# Patient Record
Sex: Female | Born: 1975 | Hispanic: No | Smoking: Current every day smoker
Health system: Southern US, Community
[De-identification: ages and names within clinical notes are randomized; demographics above are authoritative.]

## PROBLEM LIST (undated history)

## (undated) DIAGNOSIS — F1021 Alcohol dependence, in remission: Secondary | ICD-10-CM

## (undated) DIAGNOSIS — F411 Generalized anxiety disorder: Secondary | ICD-10-CM

## (undated) DIAGNOSIS — F1910 Other psychoactive substance abuse, uncomplicated: Secondary | ICD-10-CM

## (undated) DIAGNOSIS — Z9151 Personal history of suicidal behavior: Secondary | ICD-10-CM

## (undated) DIAGNOSIS — F419 Anxiety disorder, unspecified: Secondary | ICD-10-CM

## (undated) DIAGNOSIS — J410 Simple chronic bronchitis: Secondary | ICD-10-CM

## (undated) DIAGNOSIS — F1120 Opioid dependence, uncomplicated: Secondary | ICD-10-CM

## (undated) DIAGNOSIS — F32A Depression, unspecified: Secondary | ICD-10-CM

## (undated) DIAGNOSIS — F329 Major depressive disorder, single episode, unspecified: Secondary | ICD-10-CM

## (undated) DIAGNOSIS — F319 Bipolar disorder, unspecified: Secondary | ICD-10-CM

## (undated) DIAGNOSIS — F1420 Cocaine dependence, uncomplicated: Secondary | ICD-10-CM

## (undated) DIAGNOSIS — E7800 Pure hypercholesterolemia, unspecified: Secondary | ICD-10-CM

## (undated) HISTORY — DX: Other psychoactive substance abuse, uncomplicated: F19.10

## (undated) HISTORY — DX: Major depressive disorder, single episode, unspecified: F32.9

## (undated) HISTORY — PX: TUBAL LIGATION: SHX77

## (undated) HISTORY — DX: Depression, unspecified: F32.A

## (undated) HISTORY — DX: Anxiety disorder, unspecified: F41.9

---

## 1998-04-02 ENCOUNTER — Other Ambulatory Visit: Admission: RE | Admit: 1998-04-02 | Discharge: 1998-04-02 | Payer: Self-pay | Admitting: Obstetrics and Gynecology

## 1998-05-01 ENCOUNTER — Inpatient Hospital Stay (HOSPITAL_COMMUNITY): Admission: AD | Admit: 1998-05-01 | Discharge: 1998-05-01 | Payer: Self-pay | Admitting: Obstetrics and Gynecology

## 1998-05-03 ENCOUNTER — Inpatient Hospital Stay (HOSPITAL_COMMUNITY): Admission: AD | Admit: 1998-05-03 | Discharge: 1998-05-03 | Payer: Self-pay | Admitting: Obstetrics and Gynecology

## 1998-05-06 ENCOUNTER — Inpatient Hospital Stay (HOSPITAL_COMMUNITY): Admission: AD | Admit: 1998-05-06 | Discharge: 1998-05-06 | Payer: Self-pay | Admitting: Unknown Physician Specialty

## 1999-06-17 ENCOUNTER — Other Ambulatory Visit: Admission: RE | Admit: 1999-06-17 | Discharge: 1999-06-17 | Payer: Self-pay | Admitting: Obstetrics and Gynecology

## 2000-01-09 ENCOUNTER — Other Ambulatory Visit: Admission: RE | Admit: 2000-01-09 | Discharge: 2000-01-09 | Payer: Self-pay | Admitting: Obstetrics and Gynecology

## 2000-01-30 ENCOUNTER — Inpatient Hospital Stay (HOSPITAL_COMMUNITY): Admission: AD | Admit: 2000-01-30 | Discharge: 2000-02-01 | Payer: Self-pay | Admitting: Obstetrics and Gynecology

## 2000-08-13 ENCOUNTER — Emergency Department (HOSPITAL_COMMUNITY): Admission: EM | Admit: 2000-08-13 | Discharge: 2000-08-13 | Payer: Self-pay | Admitting: Emergency Medicine

## 2001-03-10 ENCOUNTER — Other Ambulatory Visit: Admission: RE | Admit: 2001-03-10 | Discharge: 2001-03-10 | Payer: Self-pay | Admitting: *Deleted

## 2001-09-11 ENCOUNTER — Inpatient Hospital Stay (HOSPITAL_COMMUNITY): Admission: AD | Admit: 2001-09-11 | Discharge: 2001-09-13 | Payer: Self-pay | Admitting: Gynecology

## 2001-09-13 ENCOUNTER — Encounter: Payer: Self-pay | Admitting: Gynecology

## 2001-10-25 ENCOUNTER — Other Ambulatory Visit: Admission: RE | Admit: 2001-10-25 | Discharge: 2001-10-25 | Payer: Self-pay | Admitting: *Deleted

## 2003-03-22 ENCOUNTER — Inpatient Hospital Stay (HOSPITAL_COMMUNITY): Admission: AD | Admit: 2003-03-22 | Discharge: 2003-03-22 | Payer: Self-pay | Admitting: *Deleted

## 2004-09-13 ENCOUNTER — Emergency Department (HOSPITAL_COMMUNITY): Admission: EM | Admit: 2004-09-13 | Discharge: 2004-09-13 | Payer: Self-pay | Admitting: Emergency Medicine

## 2005-01-13 ENCOUNTER — Ambulatory Visit: Payer: Self-pay | Admitting: Family Medicine

## 2005-01-24 ENCOUNTER — Ambulatory Visit: Payer: Self-pay | Admitting: Internal Medicine

## 2005-01-27 ENCOUNTER — Ambulatory Visit (HOSPITAL_COMMUNITY): Admission: RE | Admit: 2005-01-27 | Discharge: 2005-01-27 | Payer: Self-pay | Admitting: Internal Medicine

## 2005-02-27 ENCOUNTER — Emergency Department (HOSPITAL_COMMUNITY): Admission: EM | Admit: 2005-02-27 | Discharge: 2005-02-27 | Payer: Self-pay | Admitting: Emergency Medicine

## 2005-06-22 ENCOUNTER — Ambulatory Visit: Payer: Self-pay | Admitting: Psychiatry

## 2005-06-22 ENCOUNTER — Inpatient Hospital Stay (HOSPITAL_COMMUNITY): Admission: AD | Admit: 2005-06-22 | Discharge: 2005-06-26 | Payer: Self-pay | Admitting: Psychiatry

## 2005-06-22 ENCOUNTER — Encounter: Payer: Self-pay | Admitting: *Deleted

## 2006-04-29 ENCOUNTER — Ambulatory Visit: Payer: Self-pay | Admitting: Internal Medicine

## 2006-05-18 ENCOUNTER — Ambulatory Visit (HOSPITAL_COMMUNITY): Admission: RE | Admit: 2006-05-18 | Discharge: 2006-05-18 | Payer: Self-pay | Admitting: Family Medicine

## 2006-06-25 ENCOUNTER — Ambulatory Visit (HOSPITAL_COMMUNITY): Admission: RE | Admit: 2006-06-25 | Discharge: 2006-06-25 | Payer: Self-pay | Admitting: Family Medicine

## 2006-10-07 ENCOUNTER — Ambulatory Visit (HOSPITAL_COMMUNITY): Admission: RE | Admit: 2006-10-07 | Discharge: 2006-10-07 | Payer: Self-pay | Admitting: Family Medicine

## 2006-10-23 ENCOUNTER — Inpatient Hospital Stay (HOSPITAL_COMMUNITY): Admission: AD | Admit: 2006-10-23 | Discharge: 2006-10-23 | Payer: Self-pay | Admitting: Obstetrics & Gynecology

## 2006-10-23 ENCOUNTER — Ambulatory Visit: Payer: Self-pay | Admitting: *Deleted

## 2006-10-30 ENCOUNTER — Ambulatory Visit (HOSPITAL_COMMUNITY): Admission: RE | Admit: 2006-10-30 | Discharge: 2006-10-30 | Payer: Self-pay | Admitting: Family Medicine

## 2006-11-05 ENCOUNTER — Ambulatory Visit: Payer: Self-pay | Admitting: Family Medicine

## 2006-11-10 ENCOUNTER — Inpatient Hospital Stay (HOSPITAL_COMMUNITY): Admission: AD | Admit: 2006-11-10 | Discharge: 2006-11-10 | Payer: Self-pay | Admitting: Obstetrics and Gynecology

## 2006-11-10 ENCOUNTER — Ambulatory Visit: Payer: Self-pay | Admitting: Obstetrics and Gynecology

## 2006-11-12 ENCOUNTER — Ambulatory Visit: Payer: Self-pay | Admitting: Gynecology

## 2006-11-12 ENCOUNTER — Inpatient Hospital Stay (HOSPITAL_COMMUNITY): Admission: AD | Admit: 2006-11-12 | Discharge: 2006-11-14 | Payer: Self-pay | Admitting: Obstetrics and Gynecology

## 2006-11-12 ENCOUNTER — Ambulatory Visit: Payer: Self-pay | Admitting: Family Medicine

## 2007-06-12 ENCOUNTER — Emergency Department (HOSPITAL_COMMUNITY): Admission: EM | Admit: 2007-06-12 | Discharge: 2007-06-12 | Payer: Self-pay | Admitting: Emergency Medicine

## 2008-02-25 ENCOUNTER — Emergency Department (HOSPITAL_COMMUNITY): Admission: EM | Admit: 2008-02-25 | Discharge: 2008-02-25 | Payer: Self-pay | Admitting: Emergency Medicine

## 2009-02-27 ENCOUNTER — Emergency Department (HOSPITAL_COMMUNITY): Admission: EM | Admit: 2009-02-27 | Discharge: 2009-02-27 | Payer: Self-pay | Admitting: Emergency Medicine

## 2010-01-27 ENCOUNTER — Emergency Department (HOSPITAL_COMMUNITY): Admission: EM | Admit: 2010-01-27 | Discharge: 2010-01-27 | Payer: Self-pay | Admitting: Family Medicine

## 2010-11-21 ENCOUNTER — Emergency Department (HOSPITAL_COMMUNITY)
Admission: EM | Admit: 2010-11-21 | Discharge: 2010-11-21 | Payer: Self-pay | Source: Home / Self Care | Admitting: Emergency Medicine

## 2010-11-23 ENCOUNTER — Emergency Department (HOSPITAL_COMMUNITY)
Admission: EM | Admit: 2010-11-23 | Discharge: 2010-11-23 | Payer: Self-pay | Source: Home / Self Care | Admitting: Emergency Medicine

## 2010-12-22 ENCOUNTER — Encounter: Payer: Self-pay | Admitting: *Deleted

## 2011-02-20 LAB — POCT URINALYSIS DIP (DEVICE)
Glucose, UA: NEGATIVE mg/dL
Nitrite: NEGATIVE
Protein, ur: 30 mg/dL — AB
Specific Gravity, Urine: 1.025 (ref 1.005–1.030)
Urobilinogen, UA: 0.2 mg/dL (ref 0.0–1.0)
pH: 5.5 (ref 5.0–8.0)

## 2011-02-20 LAB — URINE CULTURE: Colony Count: 75000

## 2011-02-20 LAB — GC/CHLAMYDIA PROBE AMP, GENITAL
Chlamydia, DNA Probe: NEGATIVE
GC Probe Amp, Genital: NEGATIVE

## 2011-02-20 LAB — POCT PREGNANCY, URINE: Preg Test, Ur: NEGATIVE

## 2011-02-20 LAB — WET PREP, GENITAL
Clue Cells Wet Prep HPF POC: NONE SEEN
Yeast Wet Prep HPF POC: NONE SEEN

## 2011-03-07 ENCOUNTER — Emergency Department (HOSPITAL_COMMUNITY)
Admission: EM | Admit: 2011-03-07 | Discharge: 2011-03-07 | Disposition: A | Discharge status: Home / Self Care | Payer: Medicaid Other | Attending: Emergency Medicine | Admitting: Emergency Medicine

## 2011-03-07 DIAGNOSIS — F329 Major depressive disorder, single episode, unspecified: Secondary | ICD-10-CM | POA: Insufficient documentation

## 2011-03-07 DIAGNOSIS — F1410 Cocaine abuse, uncomplicated: Secondary | ICD-10-CM | POA: Insufficient documentation

## 2011-03-07 DIAGNOSIS — S61509A Unspecified open wound of unspecified wrist, initial encounter: Secondary | ICD-10-CM | POA: Insufficient documentation

## 2011-03-07 DIAGNOSIS — F411 Generalized anxiety disorder: Secondary | ICD-10-CM | POA: Insufficient documentation

## 2011-03-07 DIAGNOSIS — F3289 Other specified depressive episodes: Secondary | ICD-10-CM | POA: Insufficient documentation

## 2011-03-07 DIAGNOSIS — F1010 Alcohol abuse, uncomplicated: Secondary | ICD-10-CM | POA: Insufficient documentation

## 2011-03-07 DIAGNOSIS — X789XXA Intentional self-harm by unspecified sharp object, initial encounter: Secondary | ICD-10-CM | POA: Insufficient documentation

## 2011-03-07 LAB — DIFFERENTIAL
Basophils Absolute: 0 10*3/uL (ref 0.0–0.1)
Basophils Relative: 0 % (ref 0–1)
Eosinophils Absolute: 0.1 10*3/uL (ref 0.0–0.7)
Eosinophils Relative: 1 % (ref 0–5)
Lymphocytes Relative: 18 % (ref 12–46)
Lymphs Abs: 2.4 10*3/uL (ref 0.7–4.0)
Monocytes Absolute: 0.5 10*3/uL (ref 0.1–1.0)
Monocytes Relative: 4 % (ref 3–12)
Neutro Abs: 10.3 10*3/uL — ABNORMAL HIGH (ref 1.7–7.7)
Neutrophils Relative %: 77 % (ref 43–77)

## 2011-03-07 LAB — COMPREHENSIVE METABOLIC PANEL
ALT: 15 U/L (ref 0–35)
AST: 16 U/L (ref 0–37)
Albumin: 3.8 g/dL (ref 3.5–5.2)
Alkaline Phosphatase: 73 U/L (ref 39–117)
BUN: 9 mg/dL (ref 6–23)
CO2: 25 mEq/L (ref 19–32)
Calcium: 9.1 mg/dL (ref 8.4–10.5)
Chloride: 105 mEq/L (ref 96–112)
Creatinine, Ser: 0.95 mg/dL (ref 0.4–1.2)
GFR calc Af Amer: >60 mL/min (ref >60)
GFR calc non Af Amer: >60 mL/min (ref >60)
Glucose, Bld: 95 mg/dL (ref 70–99)
Potassium: 3.9 mEq/L (ref 3.5–5.1)
Sodium: 136 mEq/L (ref 135–145)
Total Bilirubin: 0.7 mg/dL (ref 0.3–1.2)
Total Protein: 6.9 g/dL (ref 6.0–8.3)

## 2011-03-07 LAB — CBC
HCT: 42.3 % (ref 36.0–46.0)
Hemoglobin: 13.9 g/dL (ref 12.0–15.0)
MCH: 31.5 pg (ref 26.0–34.0)
MCHC: 32.9 g/dL (ref 30.0–36.0)
MCV: 95.9 fL (ref 78.0–100.0)
Platelets: 373 10*3/uL (ref 150–400)
RBC: 4.41 MIL/uL (ref 3.87–5.11)
RDW: 13.2 % (ref 11.5–15.5)
WBC: 13.3 10*3/uL — ABNORMAL HIGH (ref 4.0–10.5)

## 2011-03-07 LAB — URINALYSIS, ROUTINE W REFLEX MICROSCOPIC
Bilirubin Urine: NEGATIVE
Glucose, UA: NEGATIVE mg/dL
Hgb urine dipstick: NEGATIVE
Ketones, ur: NEGATIVE mg/dL
Nitrite: NEGATIVE
Protein, ur: NEGATIVE mg/dL
Specific Gravity, Urine: 1.021 (ref 1.005–1.030)
Urobilinogen, UA: 0.2 mg/dL (ref 0.0–1.0)
pH: 7 (ref 5.0–8.0)

## 2011-03-07 LAB — RAPID URINE DRUG SCREEN, HOSP PERFORMED
Amphetamines: NOT DETECTED
Barbiturates: NOT DETECTED
Benzodiazepines: POSITIVE — AB
Cocaine: POSITIVE — AB
Opiates: NOT DETECTED
Tetrahydrocannabinol: POSITIVE — AB

## 2011-03-07 LAB — ETHANOL: Alcohol, Ethyl (B): <5 mg/dL (ref 0–10)

## 2011-03-13 ENCOUNTER — Emergency Department (HOSPITAL_COMMUNITY)
Admission: EM | Admit: 2011-03-13 | Discharge: 2011-03-14 | Disposition: A | Discharge status: Home / Self Care | Payer: Medicaid Other | Attending: Emergency Medicine | Admitting: Emergency Medicine

## 2011-03-13 DIAGNOSIS — F1910 Other psychoactive substance abuse, uncomplicated: Secondary | ICD-10-CM | POA: Insufficient documentation

## 2011-03-13 LAB — COMPREHENSIVE METABOLIC PANEL
ALT: 13 U/L (ref 0–35)
AST: 16 U/L (ref 0–37)
Albumin: 3.7 g/dL (ref 3.5–5.2)
Alkaline Phosphatase: 72 U/L (ref 39–117)
BUN: 14 mg/dL (ref 6–23)
CO2: 26 mEq/L (ref 19–32)
Calcium: 9.1 mg/dL (ref 8.4–10.5)
Chloride: 106 mEq/L (ref 96–112)
Creatinine, Ser: 1.04 mg/dL (ref 0.4–1.2)
GFR calc Af Amer: >60 mL/min (ref >60)
GFR calc non Af Amer: 60 mL/min — ABNORMAL LOW (ref >60)
Glucose, Bld: 82 mg/dL (ref 70–99)
Potassium: 3.9 mEq/L (ref 3.5–5.1)
Sodium: 138 mEq/L (ref 135–145)
Total Bilirubin: 0.7 mg/dL (ref 0.3–1.2)
Total Protein: 6.8 g/dL (ref 6.0–8.3)

## 2011-03-13 LAB — DIFFERENTIAL
Basophils Absolute: 0.1 10*3/uL (ref 0.0–0.1)
Basophils Relative: 1 % (ref 0–1)
Eosinophils Absolute: 0.1 10*3/uL (ref 0.0–0.7)
Eosinophils Relative: 1 % (ref 0–5)
Lymphocytes Relative: 32 % (ref 12–46)
Lymphs Abs: 3.5 10*3/uL (ref 0.7–4.0)
Monocytes Absolute: 0.9 10*3/uL (ref 0.1–1.0)
Monocytes Relative: 9 % (ref 3–12)
Neutro Abs: 6.3 10*3/uL (ref 1.7–7.7)
Neutrophils Relative %: 57 % (ref 43–77)

## 2011-03-13 LAB — POCT PREGNANCY, URINE: Preg Test, Ur: NEGATIVE

## 2011-03-13 LAB — CBC
HCT: 40.5 % (ref 36.0–46.0)
Hemoglobin: 13.7 g/dL (ref 12.0–15.0)
MCH: 31.4 pg (ref 26.0–34.0)
MCHC: 33.8 g/dL (ref 30.0–36.0)
MCV: 92.7 fL (ref 78.0–100.0)
Platelets: 379 10*3/uL (ref 150–400)
RBC: 4.37 MIL/uL (ref 3.87–5.11)
RDW: 12.9 % (ref 11.5–15.5)
WBC: 10.9 10*3/uL — ABNORMAL HIGH (ref 4.0–10.5)

## 2011-03-13 LAB — RAPID URINE DRUG SCREEN, HOSP PERFORMED
Amphetamines: NOT DETECTED
Barbiturates: NOT DETECTED
Benzodiazepines: POSITIVE — AB
Cocaine: POSITIVE — AB
Opiates: NOT DETECTED
Tetrahydrocannabinol: POSITIVE — AB

## 2011-03-13 LAB — ETHANOL: Alcohol, Ethyl (B): <5 mg/dL (ref 0–10)

## 2011-04-18 NOTE — Discharge Summary (Signed)
[facility]  Patient:    Michele Webster, Michele Webster Visit Number: [id number] MRN: [id number]          Service Type: OBS Location: 910A [id number] [id number] Attending Physician:  Tonye Royalty Dictated by:   Antony Contras, [facility] Admit Date:  09/11/2001 Discharge Date: 09/13/2001                             Discharge Summary  DISCHARGE DIAGNOSES:          1. Intrauterine pregnancy at 37-1/[redacted] weeks                                  gestation.                               2. Spontaneous onset of labor.  PROCEDURES:                   Normal spontaneous vaginal delivery of                               viable infant over intact perineum.  HISTORY OF PRESENT ILLNESS:   The patient is a 35 year old, gravida 5, para 1-0-3-1, with an _______ of September 25, 2001 by ultrasound. Pregnancy was complicated by cigarettes smoking.  PRENATAL LABORATORY DATA:     Blood type O positive, antibody screen negative. RPR, HBsAg, HIV nonreactive. GBS is negative.  HOSPITAL COURSE/TREATMENT:    Patient was admitted for uterine contractions on September 12, 2001. Cervix was 4-5 cm, 90% effaced, and -2 station. She progressed to complete dilatation and delivered an Apgar 8/9 female weighing 7 pounds 5 ounces over an intact perineum.  POSTPARTUM COURSE:            The patient did experience some respiratory difficulty including inspiratory and expiratory wheezes. Patient was treated with albuterol and also Zithromax 500 mg p.o. q.d. Chest x-ray also revealed no active disease. Otherwise she remained afebrile and had no difficulty voiding and was able to be discharged on her second postpartum day in satisfactory condition.  CBC:  Hematocrit 29.3, hemoglobin 10.3, WBCs 11.9, platelets 298,000.  DISPOSITION:                  The patient is to follow up in six weeks.  DISCHARGE MEDICATIONS:        The patient is to continue with prenatal vitamins and iron and Motrin and Tylox for  pain. Dictated by:   Antony Contras, [facility] Attending Physician:  Tonye Royalty DD:  09/24/01 TD:  09/27/01 Job: [id number]

## 2011-04-18 NOTE — Op Note (Signed)
Michele Webster, Michele Webster            ACCOUNT NO.:  [id number]   MEDICAL RECORD NO.:  [id number]          PATIENT TYPE:  INP   LOCATION:                                FACILITY:  [facility]   PHYSICIAN:  Tanya S. Shawnie Pons, M.D.   DATE OF BIRTH:  [birth year]   DATE OF PROCEDURE:  11/13/2006  DATE OF DISCHARGE:  11/14/2006                               OPERATIVE REPORT   PREOPERATIVE DIAGNOSIS:  Undesired fertility and multiparity.   POSTOPERATIVE DIAGNOSIS:  Undesired fertility and multiparity.   PROCEDURE:  Postpartum bilateral tubal ligation with Filshie clips.   SURGEON:  Shelbie Proctor. Shawnie Pons, M.D.   ASSISTANT:  Paticia Stack, MD   ANESTHESIA:  Epidural and local.   SPECIMEN:  None.   ESTIMATED BLOOD LOSS:  Minimal.   COMPLICATIONS:  None.   REASON FOR PROCEDURE:  The patient is a 35 year old gravida 7, para 2-1-  4-3 who desires permanent sterilization.  Was counseled in regards to  risks and benefits of this procedure including permanency of the  procedure, risk of failure 1 in 200, increased risk of ectopic.  The  patient agreed to this and desired to proceed.   PROCEDURE:  The patient was taken to the OR.  She was placed in the  supine position.  She was prepped and draped in the usual sterile  fashion.  When anesthesia was felt to be adequate, she was injected with  5 mL of 0.25% Marcaine with epinephrine.  A knife was used to make a 1  cm incision infraumbilically.  This was carried down through underlying  fascia until the peritoneum was entered bluntly with hemostat.  The  patient's left tube was identified, grasped with a Babcock clamp,  brought to the incision, followed to its fimbriated end.  Approximately  2 cm from the cornua, a Filshie clip was placed across this under direct  visualization.  The tube was returned to the abdomen.  The patient's  right tube was similarly identified, grasped with a Babcock clamp,  brought to the incision, followed to its fimbriated end.  Two  cm from  the cornua, a Filshie clip was placed across this tube as well under  direct visualization.  The tube was returned to the abdomen.  The fascia  was closed with 0 Vicryl suture in a running  fashion.  Subcuticular suture was a 4-0 Vicryl running also.  The  patient tolerated the procedure well.  All instrument, needle and lap  counts were correct x2.  The patient was awakened and taken to the  recovery room in stable condition.           ______________________________  Shelbie Proctor Shawnie Pons, M.D.     TSP/MEDQ  D:  11/13/2006  T:  11/13/2006  Job:  [id number]

## 2011-04-18 NOTE — Discharge Summary (Signed)
Michele Webster, Michele Webster            ACCOUNT NO.:  [id number]   MEDICAL RECORD NO.:  [id number]          PATIENT TYPE:  IPS   LOCATION:  0300                          FACILITY:  [facility]   PHYSICIAN:  Jeanice Lim, M.D. DATE OF BIRTH:  05/16/1976   DATE OF ADMISSION:  06/22/2005  DATE OF DISCHARGE:  06/26/2005                                 DISCHARGE SUMMARY   IDENTIFYING DATA:  This is a 35 year old Caucasian female voluntarily  admitted after overdosing on 20-30 aspirin as per report, between 5 and 10  a.m. on June 22, 2005.  Went to the ER and reported that she wanted to die.  Husband had left with children in the morning.  Relapsed with crack cocaine  and this was triggered by a rage episode.  Had used cocaine five times in  two years.  First psychiatric admission.  First treatment.  Onset of mood  swings and irritability after the birth of child.   MEDICATIONS:  Potassium 40 mEq.   ALLERGIES:  CECLOR.   PHYSICAL EXAMINATION:  Physical and neurologic exam within normal limits.   LABORATORY DATA:  Routine admission labs within normal limits.   MENTAL STATUS EXAM:  Fully alert, cooperative.  Irritable affect.  Normal  production.  Irritable.  Thought processes goal directed, coherent.  Insight  adequate.  Cognitively intact.  Judgment and insight were impaired.  Possible suicidal ideation with plan to overdose or cut wrist.   ADMISSION DIAGNOSES:  AXIS I:  Rule out bipolar disorder not otherwise  specified.  Cocaine abuse.  Polysubstance abuse.  Cocaine dependence  possibility.  AXIS II:  Deferred.  AXIS III:  Headache not otherwise specified.  AXIS IV:  Moderate (problems with support system, sleep cycle).  AXIS V:  30/54.   HOSPITAL COURSE:  The patient monitored for safety.  Reported a positive  response to crisis intervention and stabilization on medications.  No side  effects.  The patient was given medication education.   CONDITION ON DISCHARGE:  Discharged in  improved condition with no risk  issues including suicidal, homicidal or psychotic symptoms, reporting  motivation to remain abstinent with relapse prevention with a total  aftercare plan.  Discharged after medication education.   DISCHARGE MEDICATIONS:  1.  Depakote 500 mg, 2-1/2 q.h.s.  2.  Seroquel 200 mg, 1 q.h.s.  3.  Risperdal 1 mg q.h.s.  4.  Ambien 10 mg, 1 q.h.s.   The patient's valproic acid level was 55.2 on June 25, 2005.   FOLLOW UP:  To follow up with [facility] [place] and Dr. __________  at [facility].   DISCHARGE DIAGNOSES:  AXIS I:  Rule out bipolar disorder not otherwise  specified.  Cocaine abuse.  Polysubstance abuse.  Cocaine dependence  possibility.  AXIS II:  Deferred.  AXIS III:  Headache not otherwise specified.  AXIS IV:  Moderate (problems with support system, sleep cycle).  AXIS V:  GAF on discharge 55.      Jeanice Lim, M.D.  Electronically Signed     JEM/MEDQ  D:  08/05/2005  T:  08/06/2005  Job:  [id number] 

## 2011-04-18 NOTE — H&P (Signed)
[facility]  Patient:    Michele Webster, Michele Webster Visit Number: [id number] MRN: [id number]          Service Type: Attending:  Gaetano Hawthorne. Lily Peer, M.D. Dictated by:   Gaetano Hawthorne Lily Peer, M.D. Adm. Date:  09/11/01                           History and Physical  CHIEF COMPLAINT:              Contractions.  HISTORY OF PRESENT ILLNESS:   The patient is a 35 year old, gravida 5, para 1, AB 3, with estimated confinement based on ultrasound of ______ 26, 2002.  The patient is currently 37-1/2 weeks estimated gestational age.  The patient presented to the emergency room at [facility] this evening complaining of contractions which started earlier today.  Her contractions were reported initially to be 45 minutes apart.  Late this evening, they increased to about every 5 to 6 minutes apart.  She denies any rupture of membranes, no fever, chills, nausea or vomiting.  Review of her medical record indicated that she had been counselled on numerous occasions secondary to smoking.  According to patient by history, she was in the emergency room last week and was treated for some form of infection, and her contractions were stopped and did not ______ .  She also had history of rapid labor of about an hour and one-half with her last pregnancy.  Otherwise, the remainder of her prenatal course is insignificant with the exception of iron-deficiency anemia for which she was placed on supplemental iron.   Total weight gain during her pregnancy was approximately 15 pounds.  PAST MEDICAL HISTORY:         She smokes one pack of cigarettes per day.   She denies any other medical problems.  ALLERGIES:                    CECLOR.  PAST OBSTETRICAL HISTORY:     She has had spontaneous AB in 1993 and 1999. She had a therapeutic AB in 1995.  REVIEW OF SYSTEMS:            See form.  PHYSICAL EXAMINATION:  VITAL SIGNS:                  Blood pressure 119/79, pulse 92,  respirations 20, temperature 97.3.  HEENT:                        Unremarkable.  NECK:                         Supple.  Jugular venous long.  No carotid bruits or thyromegaly.  LUNGS:                        Clear to auscultation without rhonchi or wheezes.  HEART:                        Regular rate and rhythm.  No murmurs or gallops.  BREASTS:                      Exam not done.  ABDOMEN:                      Gravid uterus.  Vertex presentation by Thayer Ohm  maneuver.  Positive fetal heart tones.  PELVIC:                       Cervix 4 to 5 cm, 90% effaced, -2 station, bulging membranes.  EXTREMITIES:                  DTRs 1+, negative clonus, trace edema.  PRENATAL LABORATORY DATA:     O positive blood type, negative antibody screen. VDRL was nonreactive, rubella immune, hepatitis B surface antigen and HIV negative.  Pap smear reactive changes.  Total serum alpha-fetoprotein was normal.  GBS culture was negative.  Diabetes screen was normal.  ASSESSMENT:                   A 35 year old, gravida 5, para 1, abortions 3, currently 37-1/2 weeks estimated gestational age in active labor with reassuring fetal heart rate tracing in the emergency room, contractions 4 to 5 minutes apart with advanced cervical dilatation of 4 to 5 cm, bulging membranes, 90%, -2 station, and negative group B Streptococcus culture.  Once the patient arrives to labor and delivery, she has requested epidural.  Once the epidural is on board, will proceed with artificial rupture of membranes and Pitocin augmentation as indicated in the event of protracted labor.  PLAN:                         Per Assessment above.  Anticipate a vaginal delivery. Dictated by:   Gaetano Hawthorne Lily Peer, M.D. Attending:  Gaetano Hawthorne. Lily Peer, M.D. DD:  09/12/01 TD:  09/12/01 Job: [id number]

## 2011-07-18 ENCOUNTER — Other Ambulatory Visit: Payer: Self-pay

## 2011-07-18 ENCOUNTER — Other Ambulatory Visit (HOSPITAL_COMMUNITY)
Admission: RE | Admit: 2011-07-18 | Discharge: 2011-07-18 | Disposition: A | Discharge status: Home / Self Care | Payer: Medicaid Other | Source: Ambulatory Visit | Attending: Family Medicine | Admitting: Family Medicine

## 2011-07-18 DIAGNOSIS — Z01419 Encounter for gynecological examination (general) (routine) without abnormal findings: Secondary | ICD-10-CM | POA: Insufficient documentation

## 2012-01-21 ENCOUNTER — Ambulatory Visit (INDEPENDENT_AMBULATORY_CARE_PROVIDER_SITE_OTHER): Payer: Medicaid Other | Admitting: Surgery

## 2012-01-29 ENCOUNTER — Encounter (INDEPENDENT_AMBULATORY_CARE_PROVIDER_SITE_OTHER): Payer: Self-pay | Admitting: Surgery

## 2012-02-04 ENCOUNTER — Encounter (INDEPENDENT_AMBULATORY_CARE_PROVIDER_SITE_OTHER): Payer: Self-pay | Admitting: Surgery

## 2012-02-05 ENCOUNTER — Encounter (INDEPENDENT_AMBULATORY_CARE_PROVIDER_SITE_OTHER): Payer: Self-pay | Admitting: Surgery

## 2012-02-05 ENCOUNTER — Ambulatory Visit (INDEPENDENT_AMBULATORY_CARE_PROVIDER_SITE_OTHER): Payer: Medicaid Other | Admitting: Surgery

## 2012-02-05 VITALS — BP 110/74 | HR 68 | Temp 98.6°F | Ht 65.0 in | Wt 157.8 lb

## 2012-02-05 DIAGNOSIS — K602 Anal fissure, unspecified: Secondary | ICD-10-CM | POA: Insufficient documentation

## 2012-02-05 NOTE — Progress Notes (Signed)
Patient ID: Michele Webster, female   DOB: January 18, 1976, 36 y.o.   MRN: [id number]  Chief Complaint  Patient presents with  . Pre-op Exam    hems    HPI Michele Webster is a 36 y.o. female.  Referred by Nile Dear at [facility] for evaluation of painful hemorrhoids HPI 36 yo female presents with several months of painful bowel movements associated with some bright red blood.  She denies any constipation.  She initially had problems with hemorrhoids with pregnancy, but the last pregnancy was 10 years ago.  She is a recovering cocaine addict, and she feels that there has been some change in her bowel habits over the last year, but she continues to have daily bowel movements.  Suppositories and Miralax have been minimally helpful.  Past Medical History  Diagnosis Date  . Depression   . Anxiety   . Hypertension   . Hyperlipidemia   . Hx of endoscopy 06/08/2007  . Substance abuse     Past Surgical History  Procedure Date  . Appendectomy   . Btl   . Myringotomy   . Tubal ligation     Family History  Problem Relation Age of Onset  . Cancer Maternal Grandmother     breast    Social History History  Substance Use Topics  . Smoking status: Current Everyday Smoker  . Smokeless tobacco: Not on file  . Alcohol Use: No    Allergies  Allergen Reactions  . Ceclor (Cefaclor)     Current Outpatient Prescriptions  Medication Sig Dispense Refill  . cetirizine (ZYRTEC) 10 MG tablet Take 10 mg by mouth daily.      . hydrocortisone (ANUSOL-HC) 2.5 % rectal cream Place rectally 4 (four) times daily.      Marland Kitchen topiramate (TOPAMAX) 25 MG tablet Take 25 mg by mouth 3 (three) times daily.      . traZODone (DESYREL) 50 MG tablet Take 50 mg by mouth at bedtime.        Review of Systems Review of Systems  Constitutional: Negative for fever, chills and unexpected weight change.  HENT: Negative for hearing loss, congestion, sore throat, trouble swallowing and voice change.   Eyes: Negative  for visual disturbance.  Respiratory: Negative for cough and wheezing.   Cardiovascular: Negative for chest pain, palpitations and leg swelling.  Gastrointestinal: Positive for blood in stool, anal bleeding and rectal pain. Negative for nausea, vomiting, abdominal pain, diarrhea, constipation and abdominal distention.  Genitourinary: Negative for hematuria, vaginal bleeding and difficulty urinating.  Musculoskeletal: Negative for arthralgias.  Skin: Negative for rash and wound.  Neurological: Positive for headaches. Negative for seizures and syncope.  Hematological: Negative for adenopathy. Does not bruise/bleed easily.  Psychiatric/Behavioral: Negative for confusion.    Blood pressure 110/74, pulse 68, temperature 98.6 F (37 C), temperature source Temporal, height 5\' 5"  (1.651 m), weight 157 lb 12.8 oz (71.578 kg), SpO2 98.00%.  Physical Exam Physical Exam WDWN in NAD Rectal - minimal external hemorrhoidal skin; no inflammation or swelling; no thrombosis Anterior and posterior midline fissures which are quite tender; Data Reviewed none  Assessment    Anterior/ posterior midline fissures    Plan    Stool softeners Sitz baths before and after bowel movements Diltiazem ointment  Follow-up 1 month.        Michele Webster K. 02/05/2012, 12:13 PM

## 2012-02-05 NOTE — Patient Instructions (Signed)
Frequent sitz baths - before and after bowel movements Diltiazem ointment 2-3 times a day Stool softeners as needed to avoid hard bowel movements. Recheck in 4 weeks.

## 2012-02-06 ENCOUNTER — Encounter (INDEPENDENT_AMBULATORY_CARE_PROVIDER_SITE_OTHER): Payer: Self-pay

## 2012-02-26 ENCOUNTER — Ambulatory Visit (INDEPENDENT_AMBULATORY_CARE_PROVIDER_SITE_OTHER): Payer: Medicaid Other | Admitting: Surgery

## 2012-02-26 ENCOUNTER — Encounter (INDEPENDENT_AMBULATORY_CARE_PROVIDER_SITE_OTHER): Payer: Self-pay | Admitting: Surgery

## 2012-02-26 VITALS — BP 98/56 | HR 74 | Temp 98.0°F | Ht 65.0 in | Wt 160.2 lb

## 2012-02-26 DIAGNOSIS — K602 Anal fissure, unspecified: Secondary | ICD-10-CM

## 2012-02-26 NOTE — Progress Notes (Signed)
Patient ID: Michele Webster, female   DOB: [birth year], 36 y.o.   MRN: [id number]  Followup of her anal fissures. She is much improved. Minimal discomfort. She only sees blood on toilet paper occasionally. She has been keeping her bowel movements daily and regular without any straining.  On examination she has some loose external hemorrhoidal skin but no swelling. The posterior midline fissure seems to be completely healed. Anterior midline fissure is almost healed but is still mildly tender to palpation. No sign of abscess or fistula.  The patient should continue her current regimen until the discomfort is completely gone. Followup p.r.n  Wilmon Arms. [name], MD, [facility]  02/26/2012 11:13 AM

## 2012-04-18 ENCOUNTER — Encounter (HOSPITAL_COMMUNITY): Payer: Self-pay | Admitting: *Deleted

## 2012-04-18 ENCOUNTER — Emergency Department (HOSPITAL_COMMUNITY)
Admission: EM | Admit: 2012-04-18 | Discharge: 2012-04-18 | Disposition: A | Discharge status: Home / Self Care | Payer: Medicaid Other | Attending: Emergency Medicine | Admitting: Emergency Medicine

## 2012-04-18 ENCOUNTER — Emergency Department (HOSPITAL_COMMUNITY): Payer: Medicaid Other

## 2012-04-18 DIAGNOSIS — R079 Chest pain, unspecified: Secondary | ICD-10-CM | POA: Insufficient documentation

## 2012-04-18 DIAGNOSIS — F17200 Nicotine dependence, unspecified, uncomplicated: Secondary | ICD-10-CM | POA: Insufficient documentation

## 2012-04-18 LAB — CBC
HCT: 38.5 % (ref 36.0–46.0)
Hemoglobin: 13 g/dL (ref 12.0–15.0)
MCH: 31.3 pg (ref 26.0–34.0)
MCHC: 33.8 g/dL (ref 30.0–36.0)
MCV: 92.8 fL (ref 78.0–100.0)
Platelets: 362 10*3/uL (ref 150–400)
RBC: 4.15 MIL/uL (ref 3.87–5.11)
RDW: 12.9 % (ref 11.5–15.5)
WBC: 12 10*3/uL — ABNORMAL HIGH (ref 4.0–10.5)

## 2012-04-18 LAB — TROPONIN I: Troponin I: <0.3 ng/mL (ref <0.30)

## 2012-04-18 LAB — BASIC METABOLIC PANEL
BUN: 12 mg/dL (ref 6–23)
CO2: 25 mEq/L (ref 19–32)
Calcium: 8.1 mg/dL — ABNORMAL LOW (ref 8.4–10.5)
Chloride: 107 mEq/L (ref 96–112)
Creatinine, Ser: 0.98 mg/dL (ref 0.50–1.10)
GFR calc Af Amer: 84 mL/min — ABNORMAL LOW (ref >90)
GFR calc non Af Amer: 73 mL/min — ABNORMAL LOW (ref >90)
Glucose, Bld: 96 mg/dL (ref 70–99)
Potassium: 4 mEq/L (ref 3.5–5.1)
Sodium: 139 mEq/L (ref 135–145)

## 2012-04-18 MED ORDER — DIAZEPAM 5 MG PO TABS: 5.0000 mg | ORAL_TABLET | Freq: Two times a day (BID) | ORAL | Status: AC

## 2012-04-18 MED ORDER — DICLOFENAC SODIUM 75 MG PO TBEC: 75.0000 mg | DELAYED_RELEASE_TABLET | Freq: Two times a day (BID) | ORAL | Status: DC

## 2012-04-18 MED ORDER — KETOROLAC TROMETHAMINE 60 MG/2ML IM SOLN
60.0000 mg | Freq: Once | INTRAMUSCULAR | Status: AC
  Administered 2012-04-18: 60 mg via INTRAMUSCULAR
  Filled 2012-04-18: qty 2

## 2012-04-18 NOTE — ED Provider Notes (Signed)
History     CSN: [id number]  Arrival date & time 04/18/12  1359   First MD Initiated Contact with Patient 04/18/12 1514      Chief Complaint  Patient presents with  . Chest Pain    (Consider location/radiation/quality/duration/timing/severity/associated sxs/prior treatment) HPI History from patient. 36 year old female who presents with chest pain which started suddenly around 3 AM today, and has been persistent since that time. States this started after she was "horsing around" with her 160 pound son earlier in the day. Pain is described as "feeling like someone is punching me in the chest." It is located substernally and does not radiate. Pain is worse with any movement or bending forward.  She admits some slight shortness of breath secondary to pain. Denies diaphoresis, nausea, vomiting. Denies any recent URI symptoms. She does smoke cigarettes and admits a "smoker's cough" but this has not increased significantly recently. Denies fever or chills. No known trauma or change in activity. She's never had anything like this before. No treatment at home prior to arrival.  Patient has no prior history of DVT/PE. Denies recent trauma, surgery, or prolonged immobilization. Denies hemoptysis, leg swelling, or use of exogenous estrogen.   Past Medical History  Diagnosis Date  . Depression   . Anxiety   . Substance abuse     Past Surgical History  Procedure Date  . Appendectomy   . Tubal ligation     Family History  Problem Relation Age of Onset  . Cancer Maternal Grandmother     breast    History  Substance Use Topics  . Smoking status: Current Everyday Smoker  . Smokeless tobacco: Never Used  . Alcohol Use: No    OB History    Grav Para Term Preterm Abortions TAB SAB Ect Mult Living                  Review of Systems  Constitutional: Negative for fever, chills, activity change and appetite change.  HENT: Negative.   Eyes: Negative.   Respiratory: Negative for cough,  chest tightness and shortness of breath.   Cardiovascular: Positive for chest pain. Negative for palpitations.  Gastrointestinal: Negative for nausea, vomiting and abdominal pain.  Musculoskeletal: Negative for myalgias.  Skin: Negative for color change and rash.  Neurological: Negative for dizziness and weakness.    Allergies  Ceclor  Home Medications   Current Outpatient Rx  Name Route Sig Dispense Refill  . CETIRIZINE HCL 10 MG PO TABS Oral Take 10 mg by mouth daily.    . SERTRALINE HCL 100 MG PO TABS Oral Take 100 mg by mouth daily.    . TOPIRAMATE 25 MG PO TABS Oral Take 75 mg by mouth 3 (three) times daily.     . TRAZODONE HCL 50 MG PO TABS Oral Take 50 mg by mouth at bedtime.      BP 98/62  Pulse 71  Temp(Src) 98.3 F (36.8 C) (Oral)  Resp 20  SpO2 100%  LMP 04/18/2012  Physical Exam  Nursing note and vitals reviewed. Constitutional: She appears well-developed and well-nourished. No distress.  HENT:  Head: Normocephalic and atraumatic.  Mouth/Throat: Oropharynx is clear and moist.  Eyes: EOM are normal.  Neck: Normal range of motion. Neck supple.  Cardiovascular: Normal rate, regular rhythm, normal heart sounds and intact distal pulses.  Exam reveals no gallop and no friction rub.   No murmur heard. Pulmonary/Chest: Effort normal and breath sounds normal. She exhibits tenderness.  Chest reproducibly tender to palpation over sternum  Abdominal: Soft. There is no tenderness. There is no rebound.  Musculoskeletal: Normal range of motion.  Neurological: She is alert.  Skin: Skin is warm and dry. She is not diaphoretic.  Psychiatric: She has a normal mood and affect.    ED Course  Procedures (including critical care time)   Date: 04/18/2012   Rate: 63  Rhythm: normal sinus rhythm  QRS Axis: normal  Intervals: normal  ST/T Wave abnormalities: normal  Conduction Disutrbances:none R-R variability  Narrative Interpretation:   Old EKG Reviewed: compared  with Jul 2006, rate increased - R-R variability seen on old as well  Labs Reviewed  CBC - Abnormal; Notable for the following:    WBC 12.0 (*)    All other components within normal limits  BASIC METABOLIC PANEL - Abnormal; Notable for the following:    Calcium 8.1 (*)    GFR calc non Af Amer 73 (*)    GFR calc Af Amer 84 (*)    All other components within normal limits  TROPONIN I   Dg Chest 2 View  04/18/2012  *RADIOLOGY REPORT*  Clinical Data: Chest pain.  CHEST - 2 VIEW  Comparison: 06/22/2005  Findings: Heart size and vascularity are normal and the lungs are clear.  No osseous abnormality.  IMPRESSION: Normal chest.  Original Report Authenticated By: Gwynn Burly, M.D.     1. Chest pain       MDM  Pt presents with chest pain, which is reproducible to palpation on exam and occurred after lifting her son. Vitals stable. ECG unchanged from previous. Labs and CXR unremarkable. Do not feel this represents ACS; no concern for PE. Pt txed with toradol and had relief with this. Will tx as MSK/costochondritis. Reasons to return to ED discussed. Pt agreeable with plan.        Grant Fontana, [place] 04/18/12 2141

## 2012-04-18 NOTE — Discharge Instructions (Signed)
Your lab studies, EKG, and chest xray were all normal appearing today. Your chest pain is likely due to the muscles/cartilage in your chest. You've been given prescriptions for an anti-inflammatory and a muscle relaxer. Take these as prescribed. Return to the ED with worsening pain, high fever, or any other worrisome symptoms.  Chest Pain (Nonspecific) It is often hard to give a specific diagnosis for the cause of chest pain. There is always a chance that your pain could be related to something serious, such as a heart attack or a blood clot in the lungs. You need to follow up with your caregiver for further evaluation. CAUSES   Heartburn.   Pneumonia or bronchitis.   Anxiety or stress.   Inflammation around your heart (pericarditis) or lung (pleuritis or pleurisy).   A blood clot in the lung.   A collapsed lung (pneumothorax). It can develop suddenly on its own (spontaneous pneumothorax) or from injury (trauma) to the chest.   Shingles infection (herpes zoster virus).  The chest wall is composed of bones, muscles, and cartilage. Any of these can be the source of the pain.  The bones can be bruised by injury.   The muscles or cartilage can be strained by coughing or overwork.   The cartilage can be affected by inflammation and become sore (costochondritis).  DIAGNOSIS  Lab tests or other studies, such as X-rays, electrocardiography, stress testing, or cardiac imaging, may be needed to find the cause of your pain.  TREATMENT   Treatment depends on what may be causing your chest pain. Treatment may include:   Acid blockers for heartburn.   Anti-inflammatory medicine.   Pain medicine for inflammatory conditions.   Antibiotics if an infection is present.   You may be advised to change lifestyle habits. This includes stopping smoking and avoiding alcohol, caffeine, and chocolate.   You may be advised to keep your head raised (elevated) when sleeping. This reduces the chance of  acid going backward from your stomach into your esophagus.   Most of the time, nonspecific chest pain will improve within 2 to 3 days with rest and mild pain medicine.  HOME CARE INSTRUCTIONS   If antibiotics were prescribed, take your antibiotics as directed. Finish them even if you start to feel better.   For the next few days, avoid physical activities that bring on chest pain. Continue physical activities as directed.   Do not smoke.   Avoid drinking alcohol.   Only take over-the-counter or prescription medicine for pain, discomfort, or fever as directed by your caregiver.   Follow your caregiver's suggestions for further testing if your chest pain does not go away.   Keep any follow-up appointments you made. If you do not go to an appointment, you could develop lasting (chronic) problems with pain. If there is any problem keeping an appointment, you must call to reschedule.  SEEK MEDICAL CARE IF:   You think you are having problems from the medicine you are taking. Read your medicine instructions carefully.   Your chest pain does not go away, even after treatment.   You develop a rash with blisters on your chest.  SEEK IMMEDIATE MEDICAL CARE IF:   You have increased chest pain or pain that spreads to your arm, neck, jaw, back, or abdomen.   You develop shortness of breath, an increasing cough, or you are coughing up blood.   You have severe back or abdominal pain, feel nauseous, or vomit.   You develop severe weakness,  fainting, or chills.   You have a fever.  THIS IS AN EMERGENCY. Do not wait to see if the pain will go away. Get medical help at once. Call your local emergency services (911 in [place] Do not drive yourself to the hospital. MAKE SURE YOU:   Understand these instructions.   Will watch your condition.   Will get help right away if you are not doing well or get worse.  Document Released: 08/27/2005 Document Revised: 11/06/2011 Document Reviewed:  06/22/2008 [facility] Patient Information 2012 [place], [place].Chest Pain (Nonspecific) It is often hard to give a specific diagnosis for the cause of chest pain. There is always a chance that your pain could be related to something serious, such as a heart attack or a blood clot in the lungs. You need to follow up with your caregiver for further evaluation. CAUSES   Heartburn.   Pneumonia or bronchitis.   Anxiety or stress.   Inflammation around your heart (pericarditis) or lung (pleuritis or pleurisy).   A blood clot in the lung.   A collapsed lung (pneumothorax). It can develop suddenly on its own (spontaneous pneumothorax) or from injury (trauma) to the chest.   Shingles infection (herpes zoster virus).  The chest wall is composed of bones, muscles, and cartilage. Any of these can be the source of the pain.  The bones can be bruised by injury.   The muscles or cartilage can be strained by coughing or overwork.   The cartilage can be affected by inflammation and become sore (costochondritis).  DIAGNOSIS  Lab tests or other studies, such as X-rays, electrocardiography, stress testing, or cardiac imaging, may be needed to find the cause of your pain.  TREATMENT   Treatment depends on what may be causing your chest pain. Treatment may include:   Acid blockers for heartburn.   Anti-inflammatory medicine.   Pain medicine for inflammatory conditions.   Antibiotics if an infection is present.   You may be advised to change lifestyle habits. This includes stopping smoking and avoiding alcohol, caffeine, and chocolate.   You may be advised to keep your head raised (elevated) when sleeping. This reduces the chance of acid going backward from your stomach into your esophagus.   Most of the time, nonspecific chest pain will improve within 2 to 3 days with rest and mild pain medicine.  HOME CARE INSTRUCTIONS   If antibiotics were prescribed, take your antibiotics as directed. Finish  them even if you start to feel better.   For the next few days, avoid physical activities that bring on chest pain. Continue physical activities as directed.   Do not smoke.   Avoid drinking alcohol.   Only take over-the-counter or prescription medicine for pain, discomfort, or fever as directed by your caregiver.   Follow your caregiver's suggestions for further testing if your chest pain does not go away.   Keep any follow-up appointments you made. If you do not go to an appointment, you could develop lasting (chronic) problems with pain. If there is any problem keeping an appointment, you must call to reschedule.  SEEK MEDICAL CARE IF:   You think you are having problems from the medicine you are taking. Read your medicine instructions carefully.   Your chest pain does not go away, even after treatment.   You develop a rash with blisters on your chest.  SEEK IMMEDIATE MEDICAL CARE IF:   You have increased chest pain or pain that spreads to your arm, neck, jaw, back,  or abdomen.   You develop shortness of breath, an increasing cough, or you are coughing up blood.   You have severe back or abdominal pain, feel nauseous, or vomit.   You develop severe weakness, fainting, or chills.   You have a fever.  THIS IS AN EMERGENCY. Do not wait to see if the pain will go away. Get medical help at once. Call your local emergency services (911 in [place] Do not drive yourself to the hospital. MAKE SURE YOU:   Understand these instructions.   Will watch your condition.   Will get help right away if you are not doing well or get worse.  Document Released: 08/27/2005 Document Revised: 11/06/2011 Document Reviewed: 06/22/2008 [facility] Patient Information 2012 [place], [place].

## 2012-04-18 NOTE — ED Notes (Signed)
Pt states around 11 am today she developed midsternal chest pain,non-radiating. Pt states chest pain is worse when taking a deep breath in or turning body, bending down. Pt states has not been coughing enough to have chest discomfort. Pt states she did lift her 38 year old son who is 89 this morning and the pain did start after that. Pt states she has not felt this type of pain before.

## 2012-04-19 NOTE — ED Provider Notes (Signed)
Medical screening examination/treatment/procedure(s) were performed by non-physician practitioner and as supervising physician I was immediately available for consultation/collaboration.   Suzi Roots, MD 04/19/12 [phone]

## 2013-01-08 ENCOUNTER — Emergency Department (HOSPITAL_COMMUNITY)
Admission: EM | Admit: 2013-01-08 | Discharge: 2013-01-09 | Disposition: A | Discharge status: Home / Self Care | Payer: Medicaid Other | Attending: Emergency Medicine | Admitting: Emergency Medicine

## 2013-01-08 ENCOUNTER — Encounter (HOSPITAL_COMMUNITY): Payer: Self-pay | Admitting: Emergency Medicine

## 2013-01-08 DIAGNOSIS — F17200 Nicotine dependence, unspecified, uncomplicated: Secondary | ICD-10-CM | POA: Insufficient documentation

## 2013-01-08 DIAGNOSIS — F1910 Other psychoactive substance abuse, uncomplicated: Secondary | ICD-10-CM | POA: Insufficient documentation

## 2013-01-08 DIAGNOSIS — Z8659 Personal history of other mental and behavioral disorders: Secondary | ICD-10-CM | POA: Insufficient documentation

## 2013-01-08 DIAGNOSIS — R45851 Suicidal ideations: Secondary | ICD-10-CM | POA: Insufficient documentation

## 2013-01-08 DIAGNOSIS — Z3202 Encounter for pregnancy test, result negative: Secondary | ICD-10-CM | POA: Insufficient documentation

## 2013-01-08 DIAGNOSIS — F1210 Cannabis abuse, uncomplicated: Secondary | ICD-10-CM | POA: Insufficient documentation

## 2013-01-08 DIAGNOSIS — R630 Anorexia: Secondary | ICD-10-CM | POA: Insufficient documentation

## 2013-01-08 DIAGNOSIS — F1410 Cocaine abuse, uncomplicated: Secondary | ICD-10-CM | POA: Insufficient documentation

## 2013-01-08 LAB — CBC
HCT: 41.4 % (ref 36.0–46.0)
Hemoglobin: 14.5 g/dL (ref 12.0–15.0)
MCH: 31.5 pg (ref 26.0–34.0)
MCHC: 35 g/dL (ref 30.0–36.0)
MCV: 90 fL (ref 78.0–100.0)
Platelets: 423 10*3/uL — ABNORMAL HIGH (ref 150–400)
RBC: 4.6 MIL/uL (ref 3.87–5.11)
RDW: 12.4 % (ref 11.5–15.5)
WBC: 10.9 10*3/uL — ABNORMAL HIGH (ref 4.0–10.5)

## 2013-01-08 LAB — COMPREHENSIVE METABOLIC PANEL
ALT: 9 U/L (ref 0–35)
AST: 17 U/L (ref 0–37)
Albumin: 4 g/dL (ref 3.5–5.2)
Alkaline Phosphatase: 68 U/L (ref 39–117)
BUN: 8 mg/dL (ref 6–23)
CO2: 21 mEq/L (ref 19–32)
Calcium: 9.1 mg/dL (ref 8.4–10.5)
Chloride: 102 mEq/L (ref 96–112)
Creatinine, Ser: 0.79 mg/dL (ref 0.50–1.10)
GFR calc Af Amer: >90 mL/min (ref >90)
GFR calc non Af Amer: >90 mL/min (ref >90)
Glucose, Bld: 59 mg/dL — ABNORMAL LOW (ref 70–99)
Potassium: 3.2 mEq/L — ABNORMAL LOW (ref 3.5–5.1)
Sodium: 134 mEq/L — ABNORMAL LOW (ref 135–145)
Total Bilirubin: 0.7 mg/dL (ref 0.3–1.2)
Total Protein: 6.9 g/dL (ref 6.0–8.3)

## 2013-01-08 LAB — ETHANOL: Alcohol, Ethyl (B): <11 mg/dL (ref 0–11)

## 2013-01-08 LAB — POCT PREGNANCY, URINE: Preg Test, Ur: NEGATIVE

## 2013-01-08 LAB — RAPID URINE DRUG SCREEN, HOSP PERFORMED
Amphetamines: NOT DETECTED
Barbiturates: NOT DETECTED
Benzodiazepines: POSITIVE — AB
Cocaine: POSITIVE — AB
Opiates: NOT DETECTED
Tetrahydrocannabinol: POSITIVE — AB

## 2013-01-08 LAB — GLUCOSE, CAPILLARY: Glucose-Capillary: 110 mg/dL — ABNORMAL HIGH (ref 70–99)

## 2013-01-08 LAB — SALICYLATE LEVEL: Salicylate Lvl: <2 mg/dL — ABNORMAL LOW (ref 2.8–20.0)

## 2013-01-08 LAB — ACETAMINOPHEN LEVEL: Acetaminophen (Tylenol), Serum: <15 ug/mL (ref 10–30)

## 2013-01-08 MED ORDER — ACETAMINOPHEN 325 MG PO TABS: 650.0000 mg | ORAL_TABLET | ORAL | Status: DC | PRN

## 2013-01-08 MED ORDER — IBUPROFEN 600 MG PO TABS: 600.0000 mg | ORAL_TABLET | Freq: Three times a day (TID) | ORAL | Status: DC | PRN

## 2013-01-08 MED ORDER — ONDANSETRON HCL 4 MG PO TABS: 4.0000 mg | ORAL_TABLET | Freq: Three times a day (TID) | ORAL | Status: DC | PRN

## 2013-01-08 MED ORDER — ALUM & MAG HYDROXIDE-SIMETH 200-200-20 MG/5ML PO SUSP: 30.0000 mL | ORAL | Status: DC | PRN

## 2013-01-08 MED ORDER — NICOTINE 21 MG/24HR TD PT24
21.0000 mg | MEDICATED_PATCH | Freq: Every day | TRANSDERMAL | Status: DC
Start: 2013-01-08 — End: 2013-01-10
  Administered 2013-01-08 – 2013-01-09 (×2): 21 mg via TRANSDERMAL
  Filled 2013-01-08 (×2): qty 1

## 2013-01-08 MED ORDER — POTASSIUM CHLORIDE CRYS ER 20 MEQ PO TBCR
40.0000 meq | EXTENDED_RELEASE_TABLET | Freq: Once | ORAL | Status: AC
  Administered 2013-01-08: 40 meq via ORAL
  Filled 2013-01-08: qty 2

## 2013-01-08 MED ORDER — ZOLPIDEM TARTRATE 5 MG PO TABS
5.0000 mg | ORAL_TABLET | Freq: Every evening | ORAL | Status: DC | PRN
  Administered 2013-01-08 – 2013-01-09 (×2): 5 mg via ORAL
  Filled 2013-01-08 (×2): qty 1

## 2013-01-08 NOTE — ED Notes (Addendum)
Up to the desk on the phone, PO  Fluids encouraged

## 2013-01-08 NOTE — ED Notes (Signed)
Belongings moved to locker 36.  °

## 2013-01-08 NOTE — ED Provider Notes (Addendum)
Patient wishes detox from Xanax and from cocaine. She also reports suicidal ideation. No definite plan. Has history of prior suicide attempts. Patient is pleasant alert cooperative no distress Results for orders placed during the hospital encounter of 01/08/13  ACETAMINOPHEN LEVEL      Result Value Range   Acetaminophen (Tylenol), Serum <15.0  10 - 30 ug/mL  CBC      Result Value Range   WBC 10.9 (*) 4.0 - 10.5 K/uL   RBC 4.60  3.87 - 5.11 MIL/uL   Hemoglobin 14.5  12.0 - 15.0 g/dL   HCT 16.1  09.6 - 04.5 %   MCV 90.0  78.0 - 100.0 fL   MCH 31.5  26.0 - 34.0 pg   MCHC 35.0  30.0 - 36.0 g/dL   RDW 40.9  81.1 - 91.4 %   Platelets 423 (*) 150 - 400 K/uL  COMPREHENSIVE METABOLIC PANEL      Result Value Range   Sodium 134 (*) 135 - 145 mEq/L   Potassium 3.2 (*) 3.5 - 5.1 mEq/L   Chloride 102  96 - 112 mEq/L   CO2 21  19 - 32 mEq/L   Glucose, Bld 59 (*) 70 - 99 mg/dL   BUN 8  6 - 23 mg/dL   Creatinine, Ser 7.82  0.50 - 1.10 mg/dL   Calcium 9.1  8.4 - 95.6 mg/dL   Total Protein 6.9  6.0 - 8.3 g/dL   Albumin 4.0  3.5 - 5.2 g/dL   AST 17  0 - 37 U/L   ALT 9  0 - 35 U/L   Alkaline Phosphatase 68  39 - 117 U/L   Total Bilirubin 0.7  0.3 - 1.2 mg/dL   GFR calc non Af Amer >90  >90 mL/min   GFR calc Af Amer >90  >90 mL/min  ETHANOL      Result Value Range   Alcohol, Ethyl (B) <11  0 - 11 mg/dL  SALICYLATE LEVEL      Result Value Range   Salicylate Lvl <2.0 (*) 2.8 - 20.0 mg/dL  URINE RAPID DRUG SCREEN (HOSP PERFORMED)      Result Value Range   Opiates NONE DETECTED  NONE DETECTED   Cocaine POSITIVE (*) NONE DETECTED   Benzodiazepines POSITIVE (*) NONE DETECTED   Amphetamines NONE DETECTED  NONE DETECTED   Tetrahydrocannabinol POSITIVE (*) NONE DETECTED   Barbiturates NONE DETECTED  NONE DETECTED  GLUCOSE, CAPILLARY      Result Value Range   Glucose-Capillary 110 (*) 70 - 99 mg/dL  POCT PREGNANCY, URINE      Result Value Range   Preg Test, Ur NEGATIVE  NEGATIVE   No results  found. Diagnosis #1 polysubstance abuse #2 suicidal ideation #3 hypokalemia Doug Sou, MD 01/08/13 1654  Doug Sou, MD 01/08/13 2130

## 2013-01-08 NOTE — ED Notes (Signed)
Meal tray given 

## 2013-01-08 NOTE — ED Notes (Signed)
Up to the desk on the phone 

## 2013-01-08 NOTE — ED Provider Notes (Signed)
Medical screening examination/treatment/procedure(s) were conducted as a shared visit with non-physician practitioner(s) and myself.  I personally evaluated the patient during the encounter  Doug Sou, MD 01/08/13 1657

## 2013-01-08 NOTE — ED Notes (Signed)
Asleep, resting quietly. 

## 2013-01-08 NOTE — ED Notes (Addendum)
Pt has one bag of belongings  that have been placed in locker number 30.

## 2013-01-08 NOTE — ED Notes (Addendum)
Pt reports to ED with mobile crisis from home. Pt reports being depressed. Pt has had a history of two previous suicidal attempts. Pt states she has SI with no active plan. First suicide attempt involves cutting her wrists, second attempt involved an overdose. Denies HI. No evidence of hallucinations and pt denies any hallucinations. Pt states she has used xanax, cocaine, and marijuana in the past 24 hours.

## 2013-01-08 NOTE — ED Notes (Signed)
jacubowitz into see

## 2013-01-08 NOTE — BHH Counselor (Signed)
Rennis Petty, LCSWA with [facility] submitted Pt for admission to [facility]. Jacquelyne Balint, [facility] reviewed clinical information and declined Pt due to glucose level=59 and [facility] admission criteria states glucose must be 70-350 for 24 hours prior to admission. Also potassium is low at 3.2. Pt can be reconsidered for admission when these problems have been resolved.  Harlin Rain Patsy [place], LPC, [facility] Assessment Counselor

## 2013-01-08 NOTE — ED Notes (Signed)
Pt. Provided [place] sandwich and cup of sprite.

## 2013-01-08 NOTE — ED Notes (Signed)
Nursing assessment complete

## 2013-01-08 NOTE — ED Notes (Signed)
Resting quietly

## 2013-01-08 NOTE — ED Notes (Signed)
Pt up to bathroom and asked for kleenex.

## 2013-01-08 NOTE — ED Provider Notes (Signed)
History     CSN: [id number]  Arrival date & time 01/08/13  1336   First MD Initiated Contact with Patient 01/08/13 1342      Chief Complaint  Patient presents with  . Medical Clearance    (Consider location/radiation/quality/duration/timing/severity/associated sxs/prior treatment) HPI Comments: Patient is to emergency department with mobile crisis team for suicidal ideation and multi-substance abuse. Patient states that she has been having worsening thoughts of suicide without a plan for the past 2 weeks. She has a history of suicide attempts. Patient has been on a weeklong cocaine binge. She has also used Xanax and marijuana. She denies alcohol use. She currently denies other medical complaints. She states that she has not been taking her antidepressants or other medications for the past 3 months. Onset of symptoms gradual. Course is worsening. Nothing makes symptoms better or worse.  The history is provided by the patient.    Past Medical History  Diagnosis Date  . Depression   . Anxiety   . Substance abuse     Past Surgical History  Procedure Laterality Date  . Appendectomy    . Tubal ligation      Family History  Problem Relation Age of Onset  . Cancer Maternal Grandmother     breast    History  Substance Use Topics  . Smoking status: Current Every Day Smoker    Types: Cigarettes  . Smokeless tobacco: Never Used  . Alcohol Use: No    OB History   Grav Para Term Preterm Abortions TAB SAB Ect Mult Living                  Review of Systems  Constitutional: Positive for appetite change. Negative for fever.  HENT: Negative for sore throat and rhinorrhea.   Eyes: Negative for redness.  Respiratory: Negative for cough.   Cardiovascular: Negative for chest pain.  Gastrointestinal: Negative for nausea, vomiting, abdominal pain and diarrhea.  Genitourinary: Negative for dysuria.  Musculoskeletal: Negative for myalgias.  Skin: Negative for rash.  Neurological:  Negative for headaches.  Psychiatric/Behavioral: Positive for suicidal ideas.    Allergies  Ceclor  Home Medications   No current outpatient prescriptions on file.  BP 111/70  Pulse 82  Temp(Src) 97.8 F (36.6 C) (Oral)  Resp 18  SpO2 100%  LMP 01/08/2013  Physical Exam  Nursing note and vitals reviewed. Constitutional: She appears well-developed and well-nourished.  HENT:  Head: Normocephalic and atraumatic.  Eyes: Conjunctivae are normal. Right eye exhibits no discharge. Left eye exhibits no discharge.  Neck: Normal range of motion. Neck supple.  Cardiovascular: Normal rate, regular rhythm and normal heart sounds.   Pulmonary/Chest: Effort normal and breath sounds normal.  Abdominal: Soft. There is no tenderness.  Neurological: She is alert.  Skin: Skin is warm and dry.  Psychiatric: She exhibits a depressed mood.    ED Course  Procedures (including critical care time)  Labs Reviewed  CBC - Abnormal; Notable for the following:    WBC 10.9 (*)    Platelets 423 (*)    All other components within normal limits  COMPREHENSIVE METABOLIC PANEL - Abnormal; Notable for the following:    Sodium 134 (*)    Potassium 3.2 (*)    Glucose, Bld 59 (*)    All other components within normal limits  SALICYLATE LEVEL - Abnormal; Notable for the following:    Salicylate Lvl <2.0 (*)    All other components within normal limits  URINE RAPID DRUG SCREEN (HOSP  PERFORMED) - Abnormal; Notable for the following:    Cocaine POSITIVE (*)    Benzodiazepines POSITIVE (*)    Tetrahydrocannabinol POSITIVE (*)    All other components within normal limits  ACETAMINOPHEN LEVEL  ETHANOL  POCT PREGNANCY, URINE   No results found.   1. Suicidal ideation   2. Polysubstance abuse     2:01 PM Patient seen and examined. Work-up initiated. Medications ordered. Holding orders completed.   Vital signs reviewed and are as follows: Filed Vitals:   01/08/13 1348  BP: 111/70  Pulse: 82   Temp: 97.8 F (36.6 C)  Resp: 18   3:50 PM Patient medically cleared. Awaiting placement per ACT/mobile crisis team. Handoff to oncoming MD Silverio Lay).    MDM  Pending placement, SI/substance abuse. Patient is here voluntarily.         Renne Crigler, [place] 01/08/13 [phone]

## 2013-01-09 LAB — POTASSIUM: Potassium: 4.3 mEq/L (ref 3.5–5.1)

## 2013-01-09 NOTE — ED Notes (Signed)
Spoke with call center for [facility]-they stated someone from their ACT team would come and reassess patient status this afternoon.

## 2013-01-09 NOTE — ED Notes (Signed)
Patient with concerns regarding her disposition and meds. Reassured her that I would call the mobile crisis center and make them aware of her concerns.

## 2013-01-09 NOTE — ED Provider Notes (Signed)
I spoke with the psychiatrist following the evaluation of this patient.  The patient is psychiatrically cleared for discharge, with provision of mental health resources, specifically those for assistance with substance abuse.  Gerhard Munch, MD 01/09/13 2328

## 2013-01-09 NOTE — ED Provider Notes (Signed)
Pt awaiting placement  Toy Baker, MD 01/09/13 1534

## 2013-01-09 NOTE — ED Notes (Signed)
Pt awake at window, calling ride before discharge

## 2013-01-09 NOTE — ED Notes (Signed)
Nursing assessment completed.

## 2013-06-07 ENCOUNTER — Emergency Department (HOSPITAL_COMMUNITY)
Admission: EM | Admit: 2013-06-07 | Discharge: 2013-06-07 | Disposition: A | Discharge status: Home / Self Care | Payer: Medicaid Other | Attending: Emergency Medicine | Admitting: Emergency Medicine

## 2013-06-07 ENCOUNTER — Encounter (HOSPITAL_COMMUNITY): Payer: Self-pay | Admitting: *Deleted

## 2013-06-07 DIAGNOSIS — IMO0002 Reserved for concepts with insufficient information to code with codable children: Secondary | ICD-10-CM | POA: Insufficient documentation

## 2013-06-07 DIAGNOSIS — F411 Generalized anxiety disorder: Secondary | ICD-10-CM | POA: Insufficient documentation

## 2013-06-07 DIAGNOSIS — F17200 Nicotine dependence, unspecified, uncomplicated: Secondary | ICD-10-CM | POA: Insufficient documentation

## 2013-06-07 DIAGNOSIS — F329 Major depressive disorder, single episode, unspecified: Secondary | ICD-10-CM | POA: Insufficient documentation

## 2013-06-07 DIAGNOSIS — F3289 Other specified depressive episodes: Secondary | ICD-10-CM | POA: Insufficient documentation

## 2013-06-07 DIAGNOSIS — R296 Repeated falls: Secondary | ICD-10-CM | POA: Insufficient documentation

## 2013-06-07 DIAGNOSIS — M7918 Myalgia, other site: Secondary | ICD-10-CM

## 2013-06-07 DIAGNOSIS — Y9389 Activity, other specified: Secondary | ICD-10-CM | POA: Insufficient documentation

## 2013-06-07 DIAGNOSIS — Z79899 Other long term (current) drug therapy: Secondary | ICD-10-CM | POA: Insufficient documentation

## 2013-06-07 DIAGNOSIS — Y929 Unspecified place or not applicable: Secondary | ICD-10-CM | POA: Insufficient documentation

## 2013-06-07 MED ORDER — IBUPROFEN 800 MG PO TABS
800.0000 mg | ORAL_TABLET | Freq: Once | ORAL | Status: AC
  Administered 2013-06-07: 800 mg via ORAL
  Filled 2013-06-07: qty 1

## 2013-06-07 MED ORDER — DIAZEPAM 5 MG PO TABS: ORAL_TABLET | ORAL | Status: DC

## 2013-06-07 MED ORDER — OXYCODONE-ACETAMINOPHEN 5-325 MG PO TABS: 1.0000 | ORAL_TABLET | Freq: Four times a day (QID) | ORAL | Status: DC | PRN

## 2013-06-07 NOTE — ED Provider Notes (Signed)
History  This chart was scribed for non-physician practitioner working with Geoffery Lyons, MD by Greggory Stallion, ED scribe. This patient was seen in room WTR6/WTR6 and the patient's care was started at 6:47 PM.  CSN: [id number] Arrival date & time 06/07/13  1818   Chief Complaint  Patient presents with  . Back Pain   The history is provided by the patient. No language interpreter was used.    HPI Comments: Michele Webster is a 37 y.o. female who presents to the Emergency Department complaining of back pain that started yesterday when she was throwing a bowling ball and fell. She states the pain radiates to her rib cage. Pt states she couldn't get up after she fell. Pt states she called her PCP and couldn't get an appointment and was told to come to the ED. Pt denies losing control of bowel movement, nausea and emesis as associated symptoms. She states she has taken Tylenol with no relief. Pt states laying down straight relieves some pain.   Past Medical History  Diagnosis Date  . Depression   . Anxiety   . Substance abuse    Past Surgical History  Procedure Laterality Date  . Tubal ligation     Family History  Problem Relation Age of Onset  . Cancer Maternal Grandmother     breast   History  Substance Use Topics  . Smoking status: Current Every Day Smoker    Types: Cigarettes  . Smokeless tobacco: Never Used  . Alcohol Use: No   OB History   Grav Para Term Preterm Abortions TAB SAB Ect Mult Living                 Review of Systems  Constitutional: Negative for diaphoresis.  HENT: Negative for neck pain and neck stiffness.   Eyes: Negative for visual disturbance.  Respiratory: Negative for apnea, chest tightness and shortness of breath.   Cardiovascular: Negative for palpitations.  Gastrointestinal: Negative for nausea, vomiting, diarrhea and constipation.  Musculoskeletal: Positive for back pain. Negative for gait problem.  Skin: Negative for rash.  Neurological:  Negative for dizziness and light-headedness.    Allergies  Ceclor  Home Medications   Current Outpatient Rx  Name  Route  Sig  Dispense  Refill  . cyclobenzaprine (FLEXERIL) 5 MG tablet   Oral   Take 5 mg by mouth 3 (three) times daily as needed for muscle spasms (muscle spasm).         . Oxcarbazepine (TRILEPTAL) 300 MG tablet   Oral   Take 300 mg by mouth 2 (two) times daily.         . risperiDONE (RISPERDAL) 1 MG tablet   Oral   Take 1 mg by mouth at bedtime.          BP 102/45  Pulse 81  Temp(Src) 98.3 F (36.8 C) (Oral)  Resp 16  SpO2 97%  LMP 05/29/2013  Physical Exam  Nursing note and vitals reviewed. Constitutional: She is oriented to person, place, and time. She appears well-developed and well-nourished. No distress.  HENT:  Head: Normocephalic and atraumatic.  Eyes: Conjunctivae and EOM are normal.  Neck: Normal range of motion. Neck supple.  No meningeal signs  Cardiovascular: Normal rate, regular rhythm and normal heart sounds.  Exam reveals no gallop and no friction rub.   No murmur heard. Asymptomatically hypotensive  Pulmonary/Chest: Effort normal and breath sounds normal. No respiratory distress. She has no wheezes. She has no rales. She exhibits no  tenderness.  Abdominal: Soft. Bowel sounds are normal. She exhibits no distension. There is no tenderness. There is no rebound and no guarding.  Musculoskeletal: Normal range of motion. She exhibits no edema and no tenderness.  FROM to upper and lower extremities No step-offs noted on C-spine No tenderness to palpation of the spinous processes of the C-spine, T-spine or L-spine Full range of motion of C-spine, T-spine or L-spine Mild tenderness to palpation of the lumbar paraspinous muscles, R>L   Neurological: She is alert and oriented to person, place, and time. No cranial nerve deficit.  Speech is clear and goal oriented, follows commands Sensation normal to light touch and two point  discrimination Moves extremities without ataxia, coordination intact Normal gait though painful and balance Normal strength in upper and lower extremities bilaterally including dorsiflexion and plantar flexion, strong and equal grip strength   Skin: Skin is warm and dry. She is not diaphoretic. No erythema.  Psychiatric: She has a normal mood and affect.    ED Course  Procedures (including critical care time)  DIAGNOSTIC STUDIES: Oxygen Saturation is 97% on RA, normal by my interpretation.    COORDINATION OF CARE: 7:54 PM-Discussed treatment plan which includes 4 ibuprofen three times a day, prescription for pain medication and a muscle relaxer with pt at bedside and pt agreed to plan.   Labs Reviewed - No data to display No results found. 1. Musculoskeletal pain     MDM  Patient with new back pain associated with a bowling injury.  No neurological deficits and normal neuro exam.  Patient can walk but states is painful.  FROM.  No loss of bowel or bladder control.  No concern for cauda equina.  No fever, night sweats, weight loss, h/o cancer, IVDU.  RICE protocol and pain medicine indicated and discussed with patient. Discussed reasons to seek immediate care. Patient expresses understanding and agrees with plan.  I personally performed the services described in this documentation, which was scribed in my presence. The recorded information has been reviewed and is accurate.    Glade Nurse, PA-C 06/08/13 0126

## 2013-06-07 NOTE — ED Notes (Signed)
Pt c/o back pain reports she was "throwing a bowling ball on yesterday, I fell and couldn't get up."

## 2013-06-07 NOTE — ED Notes (Signed)
Pt care assumed, obtained verbal report.   

## 2013-06-08 NOTE — ED Provider Notes (Signed)
Medical screening examination/treatment/procedure(s) were performed by non-physician practitioner and as supervising physician I was immediately available for consultation/collaboration.  Geoffery Lyons, MD 06/08/13 [phone]

## 2014-01-23 ENCOUNTER — Emergency Department (HOSPITAL_COMMUNITY)
Admission: EM | Admit: 2014-01-23 | Discharge: 2014-01-24 | Disposition: A | Discharge status: Home / Self Care | Payer: Medicaid Other | Attending: Emergency Medicine | Admitting: Emergency Medicine

## 2014-01-23 ENCOUNTER — Encounter (HOSPITAL_COMMUNITY): Payer: Self-pay | Admitting: Emergency Medicine

## 2014-01-23 DIAGNOSIS — F3289 Other specified depressive episodes: Secondary | ICD-10-CM | POA: Insufficient documentation

## 2014-01-23 DIAGNOSIS — F1210 Cannabis abuse, uncomplicated: Secondary | ICD-10-CM | POA: Insufficient documentation

## 2014-01-23 DIAGNOSIS — F1410 Cocaine abuse, uncomplicated: Secondary | ICD-10-CM | POA: Diagnosis present

## 2014-01-23 DIAGNOSIS — F1310 Sedative, hypnotic or anxiolytic abuse, uncomplicated: Secondary | ICD-10-CM | POA: Insufficient documentation

## 2014-01-23 DIAGNOSIS — F17200 Nicotine dependence, unspecified, uncomplicated: Secondary | ICD-10-CM | POA: Insufficient documentation

## 2014-01-23 DIAGNOSIS — F329 Major depressive disorder, single episode, unspecified: Secondary | ICD-10-CM | POA: Insufficient documentation

## 2014-01-23 DIAGNOSIS — F1910 Other psychoactive substance abuse, uncomplicated: Secondary | ICD-10-CM | POA: Diagnosis present

## 2014-01-23 DIAGNOSIS — R45851 Suicidal ideations: Secondary | ICD-10-CM | POA: Insufficient documentation

## 2014-01-23 DIAGNOSIS — F32A Depression, unspecified: Secondary | ICD-10-CM | POA: Diagnosis present

## 2014-01-23 LAB — CBC
HCT: 43 % (ref 36.0–46.0)
Hemoglobin: 14.6 g/dL (ref 12.0–15.0)
MCH: 31 pg (ref 26.0–34.0)
MCHC: 34 g/dL (ref 30.0–36.0)
MCV: 91.3 fL (ref 78.0–100.0)
Platelets: 430 10*3/uL — ABNORMAL HIGH (ref 150–400)
RBC: 4.71 MIL/uL (ref 3.87–5.11)
RDW: 13.4 % (ref 11.5–15.5)
WBC: 17.7 10*3/uL — ABNORMAL HIGH (ref 4.0–10.5)

## 2014-01-23 LAB — RAPID URINE DRUG SCREEN, HOSP PERFORMED
Amphetamines: NOT DETECTED
Barbiturates: NOT DETECTED
Benzodiazepines: POSITIVE — AB
Cocaine: POSITIVE — AB
Opiates: NOT DETECTED
Tetrahydrocannabinol: POSITIVE — AB

## 2014-01-23 NOTE — ED Provider Notes (Signed)
CSN: [id number]     Arrival date & time 01/23/14  2244 History   First MD Initiated Contact with Patient 01/23/14 2320     Chief Complaint  Patient presents with  . Medical Clearance     (Consider location/radiation/quality/duration/timing/severity/associated sxs/prior Treatment) HPI Hx per PT - I am a drug addict and need rehab.  She admits to calling mobile crisis today and states she has a h/o SI. She denies any at this time, no plan. No self injury.  She has recently smoked crack cocaine denies any other ingestions. She has some depression mild in severity.   Police bedside have IVC paper work taken out by mobile crisis team stating SI with h/o drug use.  Past Medical History  Diagnosis Date  . Depression    History reviewed. No pertinent past surgical history. History reviewed. No pertinent family history. History  Substance Use Topics  . Smoking status: Current Every Day Smoker  . Smokeless tobacco: Not on file  . Alcohol Use: Yes   OB History   Grav Para Term Preterm Abortions TAB SAB Ect Mult Living                 Review of Systems  Constitutional: Negative for fever and chills.  Respiratory: Negative for shortness of breath.   Cardiovascular: Negative for chest pain.  Gastrointestinal: Negative for abdominal pain.  Musculoskeletal: Negative for back pain.  Skin: Negative for rash.  Neurological: Negative for seizures.  Psychiatric/Behavioral: Positive for suicidal ideas. Negative for self-injury.  All other systems reviewed and are negative.      Allergies  Review of patient's allergies indicates not on file.  Home Medications  No current outpatient prescriptions on file. BP 127/83  Pulse 82  Temp(Src) 98 F (36.7 C) (Oral)  Resp 18  SpO2 99%  LMP 01/16/2014 Physical Exam  Constitutional: She is oriented to person, place, and time. She appears well-developed and well-nourished.  HENT:  Head: Normocephalic and atraumatic.  Eyes: EOM are normal.  Pupils are equal, round, and reactive to light.  Neck: Neck supple.  Cardiovascular: Regular rhythm and intact distal pulses.   Pulmonary/Chest: Effort normal. No respiratory distress.  Musculoskeletal: Normal range of motion. She exhibits no edema.  Neurological: She is alert and oriented to person, place, and time.  Skin: Skin is warm and dry.  Psychiatric:  Speech clear, cooperative, apprropriate    ED Course  Procedures (including critical care time) Labs Review Labs Reviewed  CBC - Abnormal; Notable for the following:    WBC 17.7 (*)    Platelets 430 (*)    All other components within normal limits  COMPREHENSIVE METABOLIC PANEL - Abnormal; Notable for the following:    Glucose, Bld 105 (*)    All other components within normal limits  URINE RAPID DRUG SCREEN (HOSP PERFORMED) - Abnormal; Notable for the following:    Cocaine POSITIVE (*)    Benzodiazepines POSITIVE (*)    Tetrahydrocannabinol POSITIVE (*)    All other components within normal limits  ETHANOL    IVC by Mobile Crisis Team  SI/ drug abuse.  Labs ordered and sent to [facility] ED for TTS consult   MDM   Dx: SI, cocaine abuse  Labs reviewed, UDS positive  4:39 AM PSY dispo pending  Teressa Lower, MD 01/24/14 [phone]

## 2014-01-23 NOTE — ED Notes (Signed)
Pt under IVC by [employer], pt states she wants detox from crack/cocaine. Pt is also suicidal and has hx of the same.

## 2014-01-24 ENCOUNTER — Encounter (HOSPITAL_COMMUNITY): Payer: Self-pay | Admitting: Registered Nurse

## 2014-01-24 DIAGNOSIS — F1410 Cocaine abuse, uncomplicated: Secondary | ICD-10-CM | POA: Diagnosis present

## 2014-01-24 DIAGNOSIS — F1910 Other psychoactive substance abuse, uncomplicated: Secondary | ICD-10-CM | POA: Diagnosis present

## 2014-01-24 DIAGNOSIS — F3289 Other specified depressive episodes: Secondary | ICD-10-CM

## 2014-01-24 DIAGNOSIS — F32A Depression, unspecified: Secondary | ICD-10-CM | POA: Diagnosis present

## 2014-01-24 DIAGNOSIS — F329 Major depressive disorder, single episode, unspecified: Secondary | ICD-10-CM

## 2014-01-24 LAB — COMPREHENSIVE METABOLIC PANEL
ALT: 12 U/L (ref 0–35)
AST: 14 U/L (ref 0–37)
Albumin: 4.1 g/dL (ref 3.5–5.2)
Alkaline Phosphatase: 105 U/L (ref 39–117)
BUN: 6 mg/dL (ref 6–23)
CO2: 26 mEq/L (ref 19–32)
Calcium: 9.8 mg/dL (ref 8.4–10.5)
Chloride: 99 mEq/L (ref 96–112)
Creatinine, Ser: 0.81 mg/dL (ref 0.50–1.10)
GFR calc Af Amer: >90 mL/min (ref >90)
GFR calc non Af Amer: >90 mL/min (ref >90)
Glucose, Bld: 105 mg/dL — ABNORMAL HIGH (ref 70–99)
Potassium: 3.9 mEq/L (ref 3.7–5.3)
Sodium: 139 mEq/L (ref 137–147)
Total Bilirubin: 0.5 mg/dL (ref 0.3–1.2)
Total Protein: 7.8 g/dL (ref 6.0–8.3)

## 2014-01-24 LAB — ETHANOL: Alcohol, Ethyl (B): <11 mg/dL (ref 0–11)

## 2014-01-24 NOTE — Progress Notes (Signed)
Per discussion with psychiatrist and NP, patient psychiatrically stable for dc home. Plan is for patient to follow up with outpatient substance abuse resources. CSW provided patient with resources and discussed. Pt plans to follow up with [facility] and is interested in intensive outpatient [facility] possibly but will cal for further information.  CSW informed mobile crisis.    Noreene Larsson [phone]  ED CSW 01/24/2014 1212pm

## 2014-01-24 NOTE — Progress Notes (Signed)
Wrier spoke to mobile crisis Solmon Ice) regarding the patients disposition.  Per Solmon Ice an assessment has been completed and she will bring the assessment to [facility] ER today at [facility].    Per Felicia she will seek bed placement of the patient when she comes to [facility] today at [place]

## 2014-01-24 NOTE — Consult Note (Signed)
[facility] Face-to-Face Psychiatry Consult   Reason for Consult:  Polysubstance abuse Referring Physician:  EDP  Michele Webster is an 38 y.o. female. Total Time spent with patient: 45 minutes  Assessment: AXIS I:  Substance Abuse AXIS II:  Deferred AXIS III:   Past Medical History  Diagnosis Date  . Depression    AXIS IV:  other psychosocial or environmental problems AXIS V:  51-60 moderate symptoms  Plan:  No evidence of imminent risk to self or others at present.   Patient does not meet criteria for psychiatric inpatient admission. Discussed crisis plan, support from social network, calling 911, coming to the Emergency Department, and calling Suicide Hotline.  Subjective:   Michele Webster is a 38 y.o. female patient.  HPI: Patient states "I called all around for a rehab center and couldn't find one with opens; so I called mobile crisis and they came out to do an assessment one hour later the police was at my house picking me up. I told them I was having suicidal thoughts but I didn't want to kill my self; sometime I just have the thoughts that I don't want to be here or that I wish life was easier.  But no, I am not going to kill myself.  I do crack cocaine and I just want to get clean.  I need rehab."  Patient states that she take Prozac which helps with her depression.  Patient lives at home with her mother and her 3 children.  Patient denies suicidal/homicidal ideation, psychosis, and paranoia.  Patient states that she has history of alcohol abuse and went into detox 03/2011 and has been sober every since.    HPI Elements:   Location:  Cocaine Abuse. Quality:  daily use. Severity:  eighty dollars aday. Timing:  several years.  Past Psychiatric History: Past Medical History  Diagnosis Date  . Depression     reports that she has been smoking.  She does not have any smokeless tobacco history on file. She reports that she drinks alcohol. Her drug history is not on  file. History reviewed. No pertinent family history.         Allergies:   Allergies  Allergen Reactions  . Ceclor [Cefaclor] Hives  . Sertraline Other (See Comments)    Makes her crazy    ACT Assessment Complete:  No:   Past Psychiatric History: Diagnosis:  Depression, Polysubstance abuse  Hospitalizations:  Prior history of detox  Outpatient Care:  PCP prescribes Prozac   Substance Abuse Care:  Cocaine  Self-Mutilation:  Denies  Suicidal Attempts:  Denies  Homicidal Behaviors:  Denies   Violent Behaviors:  Denies   Place of Residence:  [place] Marital Status:  Single Employed/Unemployed:  Unemployed Education:   Family Supports:  Yes (mother and children) Objective: Blood pressure 110/74, pulse 63, temperature 97.4 F (36.3 C), temperature source Oral, resp. rate 18, last menstrual period 01/16/2014, SpO2 98.00%.There is no height or weight on file to calculate BMI. Results for orders placed during the hospital encounter of 01/23/14 (from the past 72 hour(s))  CBC     Status: Abnormal   Collection Time    01/23/14 11:12 PM      Result Value Ref Range   WBC 17.7 (*) 4.0 - 10.5 K/uL   RBC 4.71  3.87 - 5.11 MIL/uL   Hemoglobin 14.6  12.0 - 15.0 g/dL   HCT 43.0  36.0 - 46.0 %   MCV 91.3  78.0 - 100.0 fL  MCH 31.0  26.0 - 34.0 pg   MCHC 34.0  30.0 - 36.0 g/dL   RDW 13.4  11.5 - 15.5 %   Platelets 430 (*) 150 - 400 K/uL  COMPREHENSIVE METABOLIC PANEL     Status: Abnormal   Collection Time    01/23/14 11:12 PM      Result Value Ref Range   Sodium 139  137 - 147 mEq/L   Potassium 3.9  3.7 - 5.3 mEq/L   Chloride 99  96 - 112 mEq/L   CO2 26  19 - 32 mEq/L   Glucose, Bld 105 (*) 70 - 99 mg/dL   BUN 6  6 - 23 mg/dL   Creatinine, Ser 0.81  0.50 - 1.10 mg/dL   Calcium 9.8  8.4 - 10.5 mg/dL   Total Protein 7.8  6.0 - 8.3 g/dL   Albumin 4.1  3.5 - 5.2 g/dL   AST 14  0 - 37 U/L   ALT 12  0 - 35 U/L   Alkaline Phosphatase 105  39 - 117 U/L   Total Bilirubin 0.5  0.3  - 1.2 mg/dL   GFR calc non Af Amer >90  >90 mL/min   GFR calc Af Amer >90  >90 mL/min   Comment: (NOTE)     The eGFR has been calculated using the CKD EPI equation.     This calculation has not been validated in all clinical situations.     eGFR's persistently <90 mL/min signify possible Chronic Kidney     Disease.  ETHANOL     Status: None   Collection Time    01/23/14 11:12 PM      Result Value Ref Range   Alcohol, Ethyl (B) <11  0 - 11 mg/dL   Comment:            LOWEST DETECTABLE LIMIT FOR     SERUM ALCOHOL IS 11 mg/dL     FOR MEDICAL PURPOSES ONLY  URINE RAPID DRUG SCREEN (HOSP PERFORMED)     Status: Abnormal   Collection Time    01/23/14 11:28 PM      Result Value Ref Range   Opiates NONE DETECTED  NONE DETECTED   Cocaine POSITIVE (*) NONE DETECTED   Benzodiazepines POSITIVE (*) NONE DETECTED   Amphetamines NONE DETECTED  NONE DETECTED   Tetrahydrocannabinol POSITIVE (*) NONE DETECTED   Barbiturates NONE DETECTED  NONE DETECTED   Comment:            DRUG SCREEN FOR MEDICAL PURPOSES     ONLY.  IF CONFIRMATION IS NEEDED     FOR ANY PURPOSE, NOTIFY LAB     WITHIN 5 DAYS.                LOWEST DETECTABLE LIMITS     FOR URINE DRUG SCREEN     Drug Class       Cutoff (ng/mL)     Amphetamine      1000     Barbiturate      200     Benzodiazepine   694     Tricyclics       503     Opiates          300     Cocaine          300     THC              50   Labs are reviewed and are pertinent for  the assessment of ETOH, Illicit drug use and other medical issues.  No current facility-administered medications for this encounter.   Current Outpatient Prescriptions  Medication Sig Dispense Refill  . cyclobenzaprine (FLEXERIL) 10 MG tablet Take 10 mg by mouth at bedtime.      Marland Kitchen FLUoxetine (PROZAC) 40 MG capsule Take 40 mg by mouth daily.        Psychiatric Specialty Exam:     Blood pressure 110/74, pulse 63, temperature 97.4 F (36.3 C), temperature source Oral, resp. rate  18, last menstrual period 01/16/2014, SpO2 98.00%.There is no height or weight on file to calculate BMI.  General Appearance: Disheveled  Eye Contact::  Good  Speech:  Clear and Coherent and Normal Rate  Volume:  Normal  Mood:  "I just need rehab"  Affect:  Congruent  Thought Process:  Circumstantial and Goal Directed  Orientation:  Full (Time, Place, and Person)  Thought Content:  Rumination  Suicidal Thoughts:  No  Homicidal Thoughts:  No  Memory:  Immediate;   Good Recent;   Good  Judgement:  Poor  Insight:  Lacking  Psychomotor Activity:  Normal  Concentration:  Fair  Recall:  Good  Fund of Knowledge:Good  Language: Good  Akathisia:  No  Handed:  Right  AIMS (if indicated):     Assets:  Communication Skills Desire for Improvement  Sleep:      Musculoskeletal: Strength & Muscle Tone: within normal limits Gait & Station: normal Patient leans: N/A  Treatment Plan Summary: Inpatient or outpatient rehab services  Disposition:  Discharge home with resource information on inpatient and outpatient rehab services.  Earleen Newport, FNP 01/24/2014 2:35 PM

## 2014-01-24 NOTE — Consult Note (Signed)
Face to face evaluation and I agree with this note 

## 2014-01-24 NOTE — Discharge Instructions (Signed)
Depression, Adult Depression is feeling sad, low, down in the dumps, blue, gloomy, or empty. In general, there are two kinds of depression:  Normal sadness or grief. This can happen after something upsetting. It often goes away on its own within 2 weeks. After losing a loved one (bereavement), normal sadness and grief may last longer than two weeks. It usually gets better with time.  Clinical depression. This kind lasts longer than normal sadness or grief. It keeps you from doing the things you normally do in life. It is often hard to function at home, work, or at school. It may affect your relationships with others. Treatment is often needed. GET HELP RIGHT AWAY IF:  You have thoughts about hurting yourself or others.  You lose touch with reality (psychotic symptoms). You may:  See or hear things that are not real.  Have untrue beliefs about your life or people around you.  Your medicine is giving you problems. MAKE SURE YOU:  Understand these instructions.  Will watch your condition.  Will get help right away if you are not doing well or get worse. Document Released: 12/20/2010 Document Revised: 08/11/2012 Document Reviewed: 03/18/2012 [facility] Patient Information 2014 [place], [place].  Drug Abuse and Addiction in Sports There are many types of drugs that one may become addicted to including illegal drugs (marijuana, cocaine, amphetamines, hallucinogens, and narcotics), prescription drugs (hydrocodone, codeine, and alprazolam), and other chemicals such as alcohol or nicotine. Two types of addiction exist: physical and emotional. Physical addiction usually occurs after prolonged use of a drug. However, some drugs may only take a couple uses before addiction can occur. Physical addiction is marked by withdrawal symptoms, in which the person experiences negative symptoms such as sweat, anxiety, tremors, hallucinations, or cravings in the absence of using the drug. Emotional dependence is  the psychological desire for the "high" that the drugs produce when taken. SYMPTOMS   Inattentiveness.  Negligence.  Forgetfulness.  Insomnia.  Mood swings. RISK INCREASES WITH:   Family history of addiction.  Personal history of addictive personality. Studies have shown that risktakers, which many athletes are, have a higher risk of addiction. PREVENTION The only adequate prevention of drug abuse is abstinence from drugs. TREATMENT  The first step in quitting substance abuse is recognizing the problem and realizing that one has the power to change. Quitting requires a plan and support from others. It is often necessary to seek medical assistance. Caregivers are available to offer counseling, and for certain cases, medicine to diminish the physical symptoms of withdrawal. Many organizations exist such as Alcoholics Anonymous, Narcotics Anonymous, or the [employer] on Alcoholism that offer support for individuals who have chosen to quit their habits. Document Released: 11/17/2005 Document Revised: 02/09/2012 Document Reviewed: 03/01/2009 [facility] Patient Information 2014 [place], [place].  Polysubstance Abuse When people abuse more than one drug or type of drug it is called polysubstance or polydrug abuse. For example, many smokers also drink alcohol. This is one form of polydrug abuse. Polydrug abuse also refers to the use of a drug to counteract an unpleasant effect produced by another drug. It may also be used to help with withdrawal from another drug. People who take stimulants may become agitated. Sometimes this agitation is countered with a tranquilizer. This helps protect against the unpleasant side effects. Polydrug abuse also refers to the use of different drugs at the same time.  Anytime drug use is interfering with normal living activities, it has become abuse. This includes problems with family and  friends. Psychological dependence has developed when your mind tells you  that the drug is needed. This is usually followed by physical dependence which has developed when continuing increases of drug are required to get the same feeling or "high". This is known as addiction or chemical dependency. A person's risk is much higher if there is a history of chemical dependency in the family. SIGNS OF CHEMICAL DEPENDENCY  You have been told by friends or family that drugs have become a problem.  You fight when using drugs.  You are having blackouts (not remembering what you do while using).  You feel sick from using drugs but continue using.  You lie about use or amounts of drugs (chemicals) used.  You need chemicals to get you going.  You are suffering in work performance or in school because of drug use.  You get sick from use of drugs but continue to use anyway.  You need drugs to relate to people or feel comfortable in social situations.  You use drugs to forget problems. "Yes" answered to any of the above signs of chemical dependency indicates there are problems. The longer the use of drugs continues, the greater the problems will become. If there is a family history of drug or alcohol use, it is best not to experiment with these drugs. Continual use leads to tolerance. After tolerance develops more of the drug is needed to get the same feeling. This is followed by addiction. With addiction, drugs become the most important part of life. It becomes more important to take drugs than participate in the other usual activities of life. This includes relating to friends and family. Addiction is followed by dependency. Dependency is a condition where drugs are now needed not just to get high, but to feel normal. Addiction cannot be cured but it can be stopped. This often requires outside help and the care of professionals. Treatment centers are listed in the yellow pages under: Cocaine, Narcotics, and Alcoholics Anonymous. Most hospitals and clinics can refer you to a  specialized care center. Talk to your caregiver if you need help. Document Released: 07/09/2005 Document Revised: 02/09/2012 Document Reviewed: 11/17/2005 [facility] Patient Information 2014 [place].

## 2014-01-25 ENCOUNTER — Encounter (HOSPITAL_COMMUNITY): Payer: Self-pay | Admitting: *Deleted

## 2014-12-25 ENCOUNTER — Emergency Department (HOSPITAL_COMMUNITY)
Admission: EM | Admit: 2014-12-25 | Discharge: 2014-12-25 | Disposition: A | Discharge status: Home / Self Care | Payer: Medicaid Other | Attending: Emergency Medicine | Admitting: Emergency Medicine

## 2014-12-25 ENCOUNTER — Encounter (HOSPITAL_COMMUNITY): Payer: Self-pay | Admitting: Emergency Medicine

## 2014-12-25 DIAGNOSIS — F329 Major depressive disorder, single episode, unspecified: Secondary | ICD-10-CM | POA: Diagnosis not present

## 2014-12-25 DIAGNOSIS — S339XXA Sprain of unspecified parts of lumbar spine and pelvis, initial encounter: Secondary | ICD-10-CM

## 2014-12-25 DIAGNOSIS — S335XXA Sprain of ligaments of lumbar spine, initial encounter: Secondary | ICD-10-CM | POA: Insufficient documentation

## 2014-12-25 DIAGNOSIS — Z720 Tobacco use: Secondary | ICD-10-CM | POA: Diagnosis not present

## 2014-12-25 DIAGNOSIS — Y998 Other external cause status: Secondary | ICD-10-CM | POA: Insufficient documentation

## 2014-12-25 DIAGNOSIS — Y9289 Other specified places as the place of occurrence of the external cause: Secondary | ICD-10-CM | POA: Diagnosis not present

## 2014-12-25 DIAGNOSIS — Y9389 Activity, other specified: Secondary | ICD-10-CM | POA: Diagnosis not present

## 2014-12-25 DIAGNOSIS — F419 Anxiety disorder, unspecified: Secondary | ICD-10-CM | POA: Diagnosis not present

## 2014-12-25 DIAGNOSIS — Z79899 Other long term (current) drug therapy: Secondary | ICD-10-CM | POA: Insufficient documentation

## 2014-12-25 DIAGNOSIS — S3992XA Unspecified injury of lower back, initial encounter: Secondary | ICD-10-CM | POA: Diagnosis present

## 2014-12-25 DIAGNOSIS — X58XXXA Exposure to other specified factors, initial encounter: Secondary | ICD-10-CM | POA: Insufficient documentation

## 2014-12-25 MED ORDER — CYCLOBENZAPRINE HCL 10 MG PO TABS
10.0000 mg | ORAL_TABLET | Freq: Every day | ORAL | Status: DC
Start: 2014-12-25 — End: 2015-10-31

## 2014-12-25 MED ORDER — PREDNISONE 20 MG PO TABS: ORAL_TABLET | ORAL | Status: DC

## 2014-12-25 NOTE — ED Provider Notes (Signed)
CSN: [id number]     Arrival date & time 12/25/14  1336 History   First MD Initiated Contact with Patient 12/25/14 1359     Chief Complaint  Patient presents with  . Back Pain    lower right side     (Consider location/radiation/quality/duration/timing/severity/associated sxs/prior Treatment) HPI   39 year old female with history of polysubstance abuse, anxiety, depression who presents for evaluation of low back pain. Patient states for the past week and half she has had occasional sharp throbbing pain to her low back that radiates down to her right leg. Pain is worsening with bending forward, and with walking. Report having tingling sensation to her right buttock and her right thigh. Pain is 8 out of 10. She has been taking ibuprofen around the clock with minimal relief. No associated fever, chills, abdominal pain, dysuria, hematuria, bowel bladder incontinence, saddle anesthesia, or rash. No history of PE done DVT, no IV drug use, active cancer.  Past Medical History  Diagnosis Date  . Anxiety   . Substance abuse   . Depression    Past Surgical History  Procedure Laterality Date  . Tubal ligation     Family History  Problem Relation Age of Onset  . Cancer Maternal Grandmother     breast   History  Substance Use Topics  . Smoking status: Current Every Day Smoker    Types: Cigarettes  . Smokeless tobacco: Not on file  . Alcohol Use: Yes   OB History    No data available     Review of Systems  Constitutional: Negative for fever.  Genitourinary: Negative for dysuria and hematuria.  Musculoskeletal: Positive for back pain.  Skin: Negative for rash and wound.      Allergies  Ceclor; Sertraline; and Ceclor  Home Medications   Prior to Admission medications   Medication Sig Start Date End Date Taking? Authorizing Provider  cyclobenzaprine (FLEXERIL) 10 MG tablet Take 10 mg by mouth at bedtime.    Historical Provider, MD  cyclobenzaprine (FLEXERIL) 5 MG tablet Take 5  mg by mouth 3 (three) times daily as needed for muscle spasms (muscle spasm).    Historical Provider, MD  diazepam (VALIUM) 5 MG tablet Take 1 tablet (5 mg total) by mouth at bedtime as needed for muscle relaxer. 06/07/13   Coralee North, PA-C  FLUoxetine (PROZAC) 40 MG capsule Take 40 mg by mouth daily.    Historical Provider, MD  Oxcarbazepine (TRILEPTAL) 300 MG tablet Take 300 mg by mouth 2 (two) times daily.    Historical Provider, MD  oxyCODONE-acetaminophen (PERCOCET) 5-325 MG per tablet Take 1 tablet by mouth every 6 (six) hours as needed for pain. 06/07/13   Coralee North, PA-C  risperiDONE (RISPERDAL) 1 MG tablet Take 1 mg by mouth at bedtime.    Historical Provider, MD   BP 140/76 mmHg  Pulse 77  Temp(Src) 98.3 F (36.8 C) (Oral)  Resp 18  SpO2 100%  LMP 11/24/2014 Physical Exam  Constitutional: She appears well-developed and well-nourished. No distress.  HENT:  Head: Atraumatic.  Eyes: Conjunctivae are normal.  Neck: Neck supple.  Abdominal: Soft. There is no tenderness.  Musculoskeletal: She exhibits tenderness (Tenderness to right paralumbar spinal muscle with decrease lumbar flexion and extension and rotation secondary to pain. Positive right straight leg raise.).  Patellar deep tendon reflex intact bilaterally. Normal gait. Intact distal pedal pulse bilaterally.  Neurological: She is alert.  Skin: No rash noted.  Psychiatric: She has a normal mood and affect.  Nursing  note and vitals reviewed.   ED Course  Procedures (including critical care time)  2:17 PM Patient with pain to her right low back and right lumbosacral region with radiculopathy. She is neurovascularly intact. No red flags. She has history of substance abuse therefore I will for narcotic pain medication. With the referral given. Systematic treatment provided. Return precautions discussed. Patient is able to ambulate.  Labs Review Labs Reviewed - No data to display  Imaging Review No results found.   EKG  Interpretation None      MDM   Final diagnoses:  Lumbosacral ligament sprain, initial encounter    BP 140/76 mmHg  Pulse 77  Temp(Src) 98.3 F (36.8 C) (Oral)  Resp 18  SpO2 100%  LMP 11/24/2014     Domenic Moras, PA-C 12/25/14 1417  Dorie Rank, MD 12/26/14 [phone]

## 2014-12-25 NOTE — ED Notes (Signed)
Pt c/o right lower back pain that has been going on for week and half with occasional pin and needle like pain down her right leg.  Pt been using heating pad and ibuprofen but not helping.

## 2014-12-25 NOTE — Discharge Instructions (Signed)

## 2015-03-05 ENCOUNTER — Other Ambulatory Visit: Payer: Self-pay

## 2015-03-05 DIAGNOSIS — Z1231 Encounter for screening mammogram for malignant neoplasm of breast: Secondary | ICD-10-CM

## 2015-03-07 ENCOUNTER — Ambulatory Visit
Admission: RE | Admit: 2015-03-07 | Discharge: 2015-03-07 | Disposition: A | Discharge status: Home / Self Care | Payer: Medicaid Other | Source: Ambulatory Visit

## 2015-03-07 DIAGNOSIS — Z1231 Encounter for screening mammogram for malignant neoplasm of breast: Secondary | ICD-10-CM

## 2015-03-11 ENCOUNTER — Encounter (HOSPITAL_COMMUNITY): Payer: Self-pay | Admitting: Emergency Medicine

## 2015-03-11 ENCOUNTER — Emergency Department (HOSPITAL_COMMUNITY)
Admission: EM | Admit: 2015-03-11 | Discharge: 2015-03-11 | Disposition: A | Discharge status: Home / Self Care | Payer: Medicaid Other | Attending: Emergency Medicine | Admitting: Emergency Medicine

## 2015-03-11 DIAGNOSIS — F419 Anxiety disorder, unspecified: Secondary | ICD-10-CM | POA: Diagnosis not present

## 2015-03-11 DIAGNOSIS — Y9351 Activity, roller skating (inline) and skateboarding: Secondary | ICD-10-CM | POA: Insufficient documentation

## 2015-03-11 DIAGNOSIS — Z720 Tobacco use: Secondary | ICD-10-CM | POA: Diagnosis not present

## 2015-03-11 DIAGNOSIS — Z8639 Personal history of other endocrine, nutritional and metabolic disease: Secondary | ICD-10-CM | POA: Insufficient documentation

## 2015-03-11 DIAGNOSIS — F329 Major depressive disorder, single episode, unspecified: Secondary | ICD-10-CM | POA: Insufficient documentation

## 2015-03-11 DIAGNOSIS — M25561 Pain in right knee: Secondary | ICD-10-CM

## 2015-03-11 DIAGNOSIS — M7051 Other bursitis of knee, right knee: Secondary | ICD-10-CM

## 2015-03-11 DIAGNOSIS — Z79899 Other long term (current) drug therapy: Secondary | ICD-10-CM | POA: Diagnosis not present

## 2015-03-11 DIAGNOSIS — Z87828 Personal history of other (healed) physical injury and trauma: Secondary | ICD-10-CM | POA: Insufficient documentation

## 2015-03-11 HISTORY — DX: Pure hypercholesterolemia, unspecified: E78.00

## 2015-03-11 MED ORDER — HYDROCODONE-ACETAMINOPHEN 5-325 MG PO TABS
2.0000 | ORAL_TABLET | Freq: Once | ORAL | Status: AC
  Administered 2015-03-11: 2 via ORAL
  Filled 2015-03-11: qty 2

## 2015-03-11 NOTE — ED Provider Notes (Signed)
CSN: [id number]     Arrival date & time 03/11/15  1611 History  This chart was scribed for non-physician provider Al Corpus, PA-C, working with Ernestina Patches, MD by Irene Pap, ED Scribe. This patient was seen in room WTR7/WTR7 and patient care was started at 4:57 PM.    Chief Complaint  Patient presents with  . Knee Pain   Patient is a 39 y.o. female presenting with knee pain. The history is provided by the patient. No language interpreter was used.  Knee Pain   HPI Comments: Michele Webster is a 39 y.o. female who presents to the Emergency Department complaining of a knee injury onset one month ago. She states that she went roller skating with her children when she was tripped and fell on her knees. She states that the pain has worsened to the point where she is unable to sleep because the pain is so unbearable. She reports that the pain is mostly in the back of her knee. She reports that the pain is shooting when she walks and that no medicationShe states that she has seen her PCP and had x-rays taken but was told there was nothing broken. Patient reports that she has been referred to an orthopedist but has yet to hear from them. No numbness, tingling or weakness.   Past Medical History  Diagnosis Date  . Anxiety   . Substance abuse   . Depression   . High cholesterol    Past Surgical History  Procedure Laterality Date  . Tubal ligation     Family History  Problem Relation Age of Onset  . Cancer Maternal Grandmother     breast   History  Substance Use Topics  . Smoking status: Current Every Day Smoker    Types: Cigarettes  . Smokeless tobacco: Not on file  . Alcohol Use: Yes   OB History    No data available     Review of Systems  Musculoskeletal: Positive for joint swelling and arthralgias.  Skin: Negative for pallor and wound.  Neurological: Negative for weakness and numbness.   Allergies  Ceclor; Sertraline; and Ceclor  Home Medications   Prior to  Admission medications   Medication Sig Start Date End Date Taking? Authorizing Provider  FLUoxetine (PROZAC) 40 MG capsule Take 40 mg by mouth daily.   Yes Historical Provider, MD  ibuprofen (ADVIL,MOTRIN) 200 MG tablet Take 400 mg by mouth every 6 (six) hours as needed for moderate pain (pain).   Yes Historical Provider, MD  QUEtiapine (SEROQUEL) 300 MG tablet Take 150 mg by mouth at bedtime.   Yes Historical Provider, MD  cyclobenzaprine (FLEXERIL) 10 MG tablet Take 1 tablet (10 mg total) by mouth at bedtime. Patient not taking: Reported on 03/11/2015 12/25/14   Domenic Moras, PA-C  diazepam (VALIUM) 5 MG tablet Take 1 tablet (5 mg total) by mouth at bedtime as needed for muscle relaxer. Patient not taking: Reported on 03/11/2015 06/07/13   Marny Lowenstein, PA-C  oxyCODONE-acetaminophen (PERCOCET) 5-325 MG per tablet Take 1 tablet by mouth every 6 (six) hours as needed for pain. Patient not taking: Reported on 03/11/2015 06/07/13   Marny Lowenstein, PA-C  predniSONE (DELTASONE) 20 MG tablet 3 tabs po day one, then 2 tabs daily x 4 days Patient not taking: Reported on 03/11/2015 12/25/14   Domenic Moras, PA-C   BP 137/80 mmHg  Pulse 70  Temp(Src) 98.7 F (37.1 C) (Oral)  Resp 18  SpO2 100%   Physical Exam  Constitutional: She appears well-developed and well-nourished. No distress.  HENT:  Head: Normocephalic and atraumatic.  Eyes: Conjunctivae are normal. Right eye exhibits no discharge. Left eye exhibits no discharge.  Pulmonary/Chest: Effort normal. No respiratory distress.  Musculoskeletal:       Right knee: She exhibits ecchymosis.  2 x 2 cm area of ecchymoses to infrapatellar bursa. Exquistely tender to palpation. No medial and lateral joint line tenderness. Patient ambulatory with antalgic gait. Negative anterior and posterior Drawer test. 2+ pedal pulses bilaterally. Achilles tendon intact; no calf tenderness  Neurological: She is alert. Coordination normal.  Skin: She is not diaphoretic.   Psychiatric: She has a normal mood and affect. Her behavior is normal.  Nursing note and vitals reviewed.   ED Course  Procedures (including critical care time) DIAGNOSTIC STUDIES: Oxygen Saturation is 100% on room air, normal by my interpretation.    COORDINATION OF CARE: 5:00 PM-Discussed treatment plan which includes elevation, pain medication, follow up with orthopedist, and knee sleeve with pt at bedside and pt agreed to plan.   Labs Review Labs Reviewed - No data to display  Imaging Review No results found.   EKG Interpretation None      MDM   Final diagnoses:  Right knee pain  Infrapatellar bursitis of right knee   Patient with likely infrapatellar bursitis. Discussed ice and ibuprofen use. She is given a knee sleeve in the ED. No further indication for imaging. She's been given a referral to orthopedics and instructed to call for an appointment.  Discussed return precautions with patient. Discussed all results and patient verbalizes understanding and agrees with plan.  I personally performed the services described in this documentation, which was scribed in my presence. The recorded information has been reviewed and is accurate.   Al Corpus, PA-C 03/11/15 [place], MD 03/12/15 [phone]

## 2015-03-11 NOTE — Discharge Instructions (Signed)
Return to the emergency room with worsening of symptoms, new symptoms or with symptoms that are concerning, especially fevers, redness, swelling, unable to move knee, red streaks, numbness, dealing, weakness. RICE: Rest, Ice (three cycles of 20 mins on, 77mns off at least twice a day), compression/brace, elevation. Heating pad works well for back pain. Ibuprofen 408m(2 tablets 20029mevery 5-6 hours for 3-5 days. Call to make appointment to see as possible with an orthopedist. Read below information and follow recommendations.  Bursitis Bursitis is a swelling and soreness (inflammation) of a fluid-filled sac (bursa) that overlies and protects a joint. It can be caused by injury, overuse of the joint, arthritis or infection. The joints most likely to be affected are the elbows, shoulders, hips and knees. HOME CARE INSTRUCTIONS   Apply ice to the affected area for 15-20 minutes each hour while awake for 2 days. Put the ice in a plastic bag and place a towel between the bag of ice and your skin.  Rest the injured joint as much as possible, but continue to put the joint through a full range of motion, 4 times per day. (The shoulder joint especially becomes rapidly "frozen" if not used.) When the pain lessens, begin normal slow movements and usual activities.  Only take over-the-counter or prescription medicines for pain, discomfort or fever as directed by your caregiver.  Your caregiver may recommend draining the bursa and injecting medicine into the bursa. This may help the healing process.  Follow all instructions for follow-up with your caregiver. This includes any orthopedic referrals, physical therapy and rehabilitation. Any delay in obtaining necessary care could result in a delay or failure of the bursitis to heal and chronic pain. SEEK IMMEDIATE MEDICAL CARE IF:   Your pain increases even during treatment.  You develop an oral temperature above 102 F (38.9 C) and have heat and  inflammation over the involved bursa. MAKE SURE YOU:   Understand these instructions.  Will watch your condition.  Will get help right away if you are not doing well or get worse. Document Released: 11/14/2000 Document Revised: 02/09/2012 Document Reviewed: 02/06/2014 [facility] Information 2015 [place] information is not intended to replace advice given to you by your health care provider. Make sure you discuss any questions you have with your health care provider.

## 2015-03-11 NOTE — ED Notes (Signed)
Pt states that about month ago she went roller skating with her children and she fell on both her knees.  Pt states that she has seen her PCP and had xrays (pt has CD of xrays with her) pt states that has no fractures, but has swelling, purple in color around patella and very painful.

## 2015-10-29 ENCOUNTER — Encounter (HOSPITAL_COMMUNITY): Payer: Self-pay | Admitting: Emergency Medicine

## 2015-10-29 ENCOUNTER — Ambulatory Visit (HOSPITAL_COMMUNITY)
Admission: RE | Admit: 2015-10-29 | Discharge: 2015-10-29 | Disposition: A | Discharge status: Home / Self Care | Payer: Medicaid Other | Attending: Psychiatry | Admitting: Psychiatry

## 2015-10-29 ENCOUNTER — Emergency Department (HOSPITAL_COMMUNITY)
Admission: EM | Admit: 2015-10-29 | Discharge: 2015-10-31 | Disposition: A | Discharge status: Home / Self Care | Payer: Medicaid Other

## 2015-10-29 DIAGNOSIS — T43592A Poisoning by other antipsychotics and neuroleptics, intentional self-harm, initial encounter: Secondary | ICD-10-CM | POA: Diagnosis present

## 2015-10-29 DIAGNOSIS — R45851 Suicidal ideations: Secondary | ICD-10-CM | POA: Diagnosis not present

## 2015-10-29 DIAGNOSIS — Z8639 Personal history of other endocrine, nutritional and metabolic disease: Secondary | ICD-10-CM | POA: Insufficient documentation

## 2015-10-29 DIAGNOSIS — Z7952 Long term (current) use of systemic steroids: Secondary | ICD-10-CM | POA: Diagnosis not present

## 2015-10-29 DIAGNOSIS — F1210 Cannabis abuse, uncomplicated: Secondary | ICD-10-CM | POA: Insufficient documentation

## 2015-10-29 DIAGNOSIS — F17210 Nicotine dependence, cigarettes, uncomplicated: Secondary | ICD-10-CM | POA: Insufficient documentation

## 2015-10-29 DIAGNOSIS — Z79899 Other long term (current) drug therapy: Secondary | ICD-10-CM | POA: Diagnosis not present

## 2015-10-29 DIAGNOSIS — F1410 Cocaine abuse, uncomplicated: Secondary | ICD-10-CM | POA: Insufficient documentation

## 2015-10-29 DIAGNOSIS — F3130 Bipolar disorder, current episode depressed, mild or moderate severity, unspecified: Secondary | ICD-10-CM | POA: Diagnosis not present

## 2015-10-29 DIAGNOSIS — Z3202 Encounter for pregnancy test, result negative: Secondary | ICD-10-CM | POA: Diagnosis not present

## 2015-10-29 DIAGNOSIS — F1910 Other psychoactive substance abuse, uncomplicated: Secondary | ICD-10-CM | POA: Diagnosis present

## 2015-10-29 DIAGNOSIS — T1491XA Suicide attempt, initial encounter: Secondary | ICD-10-CM

## 2015-10-29 DIAGNOSIS — F419 Anxiety disorder, unspecified: Secondary | ICD-10-CM | POA: Diagnosis not present

## 2015-10-29 DIAGNOSIS — F319 Bipolar disorder, unspecified: Secondary | ICD-10-CM | POA: Diagnosis present

## 2015-10-29 HISTORY — DX: Bipolar disorder, unspecified: F31.9

## 2015-10-29 LAB — COMPREHENSIVE METABOLIC PANEL
ALT: 50 U/L (ref 14–54)
AST: 15 U/L (ref 15–41)
Albumin: 4 g/dL (ref 3.5–5.0)
Alkaline Phosphatase: 82 U/L (ref 38–126)
Anion gap: 11 (ref 5–15)
BUN: 9 mg/dL (ref 6–20)
CO2: 25 mmol/L (ref 22–32)
Calcium: 9.2 mg/dL (ref 8.9–10.3)
Chloride: 106 mmol/L (ref 101–111)
Creatinine, Ser: 0.79 mg/dL (ref 0.44–1.00)
GFR calc Af Amer: >60 mL/min (ref >60)
GFR calc non Af Amer: >60 mL/min (ref >60)
Glucose, Bld: 91 mg/dL (ref 65–99)
Potassium: 3.3 mmol/L — ABNORMAL LOW (ref 3.5–5.1)
Sodium: 142 mmol/L (ref 135–145)
Total Bilirubin: 0.2 mg/dL — ABNORMAL LOW (ref 0.3–1.2)
Total Protein: 6.9 g/dL (ref 6.5–8.1)

## 2015-10-29 LAB — I-STAT BETA HCG BLOOD, ED (MC, WL, AP ONLY): I-stat hCG, quantitative: <5 m[IU]/mL (ref <5)

## 2015-10-29 LAB — CBC
HCT: 36 % (ref 36.0–46.0)
Hemoglobin: 11.9 g/dL — ABNORMAL LOW (ref 12.0–15.0)
MCH: 30.7 pg (ref 26.0–34.0)
MCHC: 33.1 g/dL (ref 30.0–36.0)
MCV: 93 fL (ref 78.0–100.0)
Platelets: 481 10*3/uL — ABNORMAL HIGH (ref 150–400)
RBC: 3.87 MIL/uL (ref 3.87–5.11)
RDW: 14.6 % (ref 11.5–15.5)
WBC: 15.6 10*3/uL — ABNORMAL HIGH (ref 4.0–10.5)

## 2015-10-29 LAB — ACETAMINOPHEN LEVEL: Acetaminophen (Tylenol), Serum: <10 ug/mL — ABNORMAL LOW (ref 10–30)

## 2015-10-29 LAB — RAPID URINE DRUG SCREEN, HOSP PERFORMED
Amphetamines: NOT DETECTED
Barbiturates: NOT DETECTED
Benzodiazepines: NOT DETECTED
Cocaine: POSITIVE — AB
Opiates: NOT DETECTED
Tetrahydrocannabinol: POSITIVE — AB

## 2015-10-29 LAB — SALICYLATE LEVEL: Salicylate Lvl: <4 mg/dL (ref 2.8–30.0)

## 2015-10-29 LAB — ETHANOL: Alcohol, Ethyl (B): <5 mg/dL (ref <5)

## 2015-10-29 MED ORDER — ACETAMINOPHEN 325 MG PO TABS
650.0000 mg | ORAL_TABLET | ORAL | Status: DC | PRN
  Administered 2015-10-31: 650 mg via ORAL
  Filled 2015-10-29: qty 2

## 2015-10-29 MED ORDER — ONDANSETRON HCL 4 MG PO TABS: 4.0000 mg | ORAL_TABLET | Freq: Three times a day (TID) | ORAL | Status: DC | PRN

## 2015-10-29 MED ORDER — IBUPROFEN 200 MG PO TABS: 600.0000 mg | ORAL_TABLET | Freq: Three times a day (TID) | ORAL | Status: DC | PRN

## 2015-10-29 MED ORDER — LORAZEPAM 1 MG PO TABS: 1.0000 mg | ORAL_TABLET | Freq: Three times a day (TID) | ORAL | Status: DC | PRN

## 2015-10-29 NOTE — ED Notes (Addendum)
Pt presents with depression, SI. Pt reports she took an overdose of Seroquel today, 6-7 tablets.  Pt reports she is feeling hopeless, hearing voices, calling her worthless.  Pt admits to abusing street drugs, cocaine, marijuana.  Drinks Alcohol 2 beers per day. Monitoring for safety, Q 15 min checks in effect, no distress noted, AAO x 3.

## 2015-10-29 NOTE — BH Assessment (Addendum)
Patient reports that she took (6) 134m of seroquel in an attempt to kill herself.  Patient reports that she took the medication between the hours of 2:30pm and 4:30pm.  Per LMickel Baas NP - patient will need to come to the ED for medical clearance.  Patient has not been assessed by TTS.  Writer informed tScientist, research (physical sciences)

## 2015-10-29 NOTE — ED Notes (Signed)
Patient was sent over by behavorial health. Patient states she took a lot of Seroquel because she did not want to wake up. Patient states that she want to kill herself for a lot of reasons. Patient states that she is hearing voices. Voices are saying " I am worthless. I am not going to have nothing" per patient. Patient states that she has been doing drugs and drinking for a week.

## 2015-10-30 DIAGNOSIS — T1491XA Suicide attempt, initial encounter: Secondary | ICD-10-CM | POA: Insufficient documentation

## 2015-10-30 DIAGNOSIS — F319 Bipolar disorder, unspecified: Secondary | ICD-10-CM | POA: Diagnosis present

## 2015-10-30 DIAGNOSIS — F3130 Bipolar disorder, current episode depressed, mild or moderate severity, unspecified: Secondary | ICD-10-CM

## 2015-10-30 DIAGNOSIS — R45851 Suicidal ideations: Secondary | ICD-10-CM

## 2015-10-30 MED ORDER — QUETIAPINE FUMARATE 50 MG PO TABS
150.0000 mg | ORAL_TABLET | Freq: Every day | ORAL | Status: DC
  Administered 2015-10-30: 150 mg via ORAL
  Filled 2015-10-30: qty 1

## 2015-10-30 MED ORDER — FLUOXETINE HCL 40 MG PO CAPS: 40.0000 mg | ORAL_CAPSULE | Freq: Every day | ORAL | Status: DC

## 2015-10-30 NOTE — Progress Notes (Signed)
[facility] [address] [place], [place] [address] [phone]

## 2015-10-30 NOTE — Progress Notes (Signed)
Cm confirmed with pcp office  Pt sees Katherina Mires Schedule an appointment as soon as possible for a visit This is the [place] access medicaid assigned to you and listed on your medciad card. If you prefer another Dr contact DSS to get assist with change [place] [address] [phone] Medicaid [place] Access Covered Patient Brett Fairy [facility] Transportation assists you to your dr appointments: [phone] or [phone] Transportation Supervisor [phone] As a Medicaid client you MUST contact DSS/SSI each time you change address, move to another county or another state to keep your address updated  [place]: [place]: [phone] (main) [email or web address] [address] [place], [place] [address]

## 2015-10-30 NOTE — Consult Note (Signed)
[facility] Consult   Reason for Consult:  Depression, Anxiety, Suicide attempt, drug OD Referring Physician:  EDP Patient Identification: Michele Webster MRN:  [id number] Principal Diagnosis: Bipolar 1 disorder, depressed ([place]) Diagnosis:   Patient Active Problem List   Diagnosis Date Noted  . Bipolar 1 disorder, depressed ([place]) [F31.9] 10/30/2015    Priority: High  . Cocaine abuse [F14.10] 01/24/2014  . Polysubstance abuse [F19.10] 01/24/2014  . Depression [F32.9] 01/24/2014  . Anal fissure [K60.2] 02/05/2012    Total Time spent with patient: 45 minutes  Subjective:   Michele Webster is a 39 y.o. female patient admitted with Depression, Anxiety, Suicide attempt, drug OD  HPI:  Caucasian female, 39 years old was evaluated after she came in to the ER voicing suicide attempt.   Patient stated that she took 6-7 tablets of 150 mg dose of Seroquel in an attempt to kill herself.  She stated that she wanted to kill herself due to the anxiety of going to prison soon for 2 years.  Patient states that she has been in jail and Prison several times but stated that this time she will spend more time.  Patient reports using Cocaine and stated she has tried every "drug out there"   Patient is not able to contract for safety today.  She reports poor sleep and appetite.  Patient reports that she has attempted suicide x3 including this attempt.  She has been accepted for admission and we will be seeking placement at any facility with available bed.  Past Psychiatric History:  Bipolar disoder  Risk to Self: Suicidal Ideation: Yes-Currently Present Suicidal Intent: Yes-Currently Present Is patient at risk for suicide?: Yes Suicidal Plan?: Yes-Currently Present Specify Current Suicidal Plan: intentionally OD'd on prescription med- Seroquel Access to Means: Yes Specify Access to Suicidal Means: RX meds What has been your use of drugs/alcohol within the last 12 months?: daily  use How many times?: 2 Other Self Harm Risks: none noted Triggers for Past Attempts: Unpredictable Intentional Self Injurious Behavior: None Risk to Others: Homicidal Ideation: No (denies) Thoughts of Harm to Others: No (denies) Current Homicidal Intent: No (denies) Current Homicidal Plan: No (denies) Access to Homicidal Means: No (denies) Identified Victim: na History of harm to others?: No (denies) Assessment of Violence: None Noted Violent Behavior Description: denies Does patient have access to weapons?: No (denies) Criminal Charges Pending?: Yes Describe Pending Criminal Charges: Leighton Roach for employer; Possession of Marijuana;  (Conspiracy; Aid to delinq of a minor) Does patient have a court date: Yes Court Date:  (January, 2017) Prior Inpatient Therapy: Prior Inpatient Therapy: Yes Prior Therapy Dates: 2006, 2014 Prior Therapy Facilty/Provider(s): [facility] Reason for Treatment: SI, Depression Prior Outpatient Therapy: Prior Outpatient Therapy: Yes Prior Therapy Dates: from early 66s Prior Therapy Facilty/Provider(s): [facility], [facility],  Reason for Treatment: Dep Does patient have an ACCT team?: No Does patient have Intensive In-House Services?  : No Does patient have [facility] services? : Yes Does patient have [facility] services?: No  Past Medical History:  Past Medical History  Diagnosis Date  . Anxiety   . Substance abuse   . Depression   . High cholesterol   . Bipolar 1 disorder [facility])     Past Surgical History  Procedure Laterality Date  . Tubal ligation     Family History:  Family History  Problem Relation Age of Onset  . Cancer Maternal Grandmother     breast   Family Psychiatric  History:  Patient does  not know. Social History:  History  Alcohol Use  . Yes     History  Drug Use  . 7.00 per week  . Special: Marijuana, Cocaine    Social History   Social History  . Marital Status: Legally Separated    Spouse Name: N/A  . Number of  Children: N/A  . Years of Education: N/A   Social History Main Topics  . Smoking status: Current Every Day Smoker    Types: Cigarettes  . Smokeless tobacco: None  . Alcohol Use: Yes  . Drug Use: 7.00 per week    Special: Marijuana, Cocaine  . Sexual Activity: No   Other Topics Concern  . None   Social History Narrative   ** Merged History Encounter **       Additional Social History:    Prescriptions: See PTA list History of alcohol / drug use?: Yes Longest period of sobriety (when/how long): 6 years Name of Substance 1: Cocaine 1 - Age of First Use: 20s 1 - Amount (size/oz): $100 1 - Frequency: day 1 - Duration: 12 yrs 1 - Last Use / Amount: today Name of Substance 2: Marijuana 2 - Age of First Use: 10 2 - Amount (size/oz): a dime bag 2 - Frequency: daily 2 - Duration: everyday since 39 yo 2 - Last Use / Amount: today Name of Substance 3: Alcohol 3 - Age of First Use: teens 3 - Amount (size/oz): 2 beers 3 - Frequency: day 3 - Duration: started back in 2016 (no drinking from 2012-2016)               Allergies:   Allergies  Allergen Reactions  . Ceclor [Cefaclor] Hives  . Sertraline Other (See Comments)    Makes her crazy  . Ceclor [Cefaclor] Hives, Nausea And Vomiting and Swelling    Labs:  Results for orders placed or performed during the hospital encounter of 10/29/15 (from the past 48 hour(s))  Comprehensive metabolic panel     Status: Abnormal   Collection Time: 10/29/15  8:01 PM  Result Value Ref Range   Sodium 142 135 - 145 mmol/L   Potassium 3.3 (L) 3.5 - 5.1 mmol/L   Chloride 106 101 - 111 mmol/L   CO2 25 22 - 32 mmol/L   Glucose, Bld 91 65 - 99 mg/dL   BUN 9 6 - 20 mg/dL   Creatinine, Ser 0.79 0.44 - 1.00 mg/dL   Calcium 9.2 8.9 - 10.3 mg/dL   Total Protein 6.9 6.5 - 8.1 g/dL   Albumin 4.0 3.5 - 5.0 g/dL   AST 15 15 - 41 U/L   ALT 50 14 - 54 U/L   Alkaline Phosphatase 82 38 - 126 U/L   Total Bilirubin 0.2 (L) 0.3 - 1.2 mg/dL   GFR  calc non Af Amer >60 >60 mL/min   GFR calc Af Amer >60 >60 mL/min    Comment: (NOTE) The eGFR has been calculated using the CKD EPI equation. This calculation has not been validated in all clinical situations. eGFR's persistently <60 mL/min signify possible Chronic Kidney Disease.    Anion gap 11 5 - 15  Ethanol (ETOH)     Status: None   Collection Time: 10/29/15  8:01 PM  Result Value Ref Range   Alcohol, Ethyl (B) <5 <5 mg/dL    Comment:        LOWEST DETECTABLE LIMIT FOR SERUM ALCOHOL IS 5 mg/dL FOR MEDICAL PURPOSES ONLY   Salicylate level  Status: None   Collection Time: 10/29/15  8:01 PM  Result Value Ref Range   Salicylate Lvl <5.3 2.8 - 30.0 mg/dL  Acetaminophen level     Status: Abnormal   Collection Time: 10/29/15  8:01 PM  Result Value Ref Range   Acetaminophen (Tylenol), Serum <10 (L) 10 - 30 ug/mL    Comment:        THERAPEUTIC CONCENTRATIONS VARY SIGNIFICANTLY. A RANGE OF 10-30 ug/mL MAY BE AN EFFECTIVE CONCENTRATION FOR MANY PATIENTS. HOWEVER, SOME ARE BEST TREATED AT CONCENTRATIONS OUTSIDE THIS RANGE. ACETAMINOPHEN CONCENTRATIONS >150 ug/mL AT 4 HOURS AFTER INGESTION AND >50 ug/mL AT 12 HOURS AFTER INGESTION ARE OFTEN ASSOCIATED WITH TOXIC REACTIONS.   CBC     Status: Abnormal   Collection Time: 10/29/15  8:01 PM  Result Value Ref Range   WBC 15.6 (H) 4.0 - 10.5 K/uL   RBC 3.87 3.87 - 5.11 MIL/uL   Hemoglobin 11.9 (L) 12.0 - 15.0 g/dL   HCT 36.0 36.0 - 46.0 %   MCV 93.0 78.0 - 100.0 fL   MCH 30.7 26.0 - 34.0 pg   MCHC 33.1 30.0 - 36.0 g/dL   RDW 14.6 11.5 - 15.5 %   Platelets 481 (H) 150 - 400 K/uL  Urine rapid drug screen (hosp performed) (Not at [facility])     Status: Abnormal   Collection Time: 10/29/15  8:03 PM  Result Value Ref Range   Opiates NONE DETECTED NONE DETECTED   Cocaine POSITIVE (A) NONE DETECTED   Benzodiazepines NONE DETECTED NONE DETECTED   Amphetamines NONE DETECTED NONE DETECTED   Tetrahydrocannabinol POSITIVE (A) NONE  DETECTED   Barbiturates NONE DETECTED NONE DETECTED    Comment:        DRUG SCREEN FOR MEDICAL PURPOSES ONLY.  IF CONFIRMATION IS NEEDED FOR ANY PURPOSE, NOTIFY LAB WITHIN 5 DAYS.        LOWEST DETECTABLE LIMITS FOR URINE DRUG SCREEN Drug Class       Cutoff (ng/mL) Amphetamine      1000 Barbiturate      200 Benzodiazepine   664 Tricyclics       403 Opiates          300 Cocaine          300 THC              50   I-Stat beta hCG blood, ED ([facility], [facility], [facility] only)     Status: None   Collection Time: 10/29/15  8:12 PM  Result Value Ref Range   I-stat hCG, quantitative <5.0 <5 mIU/mL   Comment 3            Comment:   GEST. AGE      CONC.  (mIU/mL)   <=1 WEEK        5 - 50     2 WEEKS       50 - 500     3 WEEKS       100 - 10,000     4 WEEKS     1,000 - 30,000        FEMALE AND NON-PREGNANT FEMALE:     LESS THAN 5 mIU/mL     Current Facility-Administered Medications  Medication Dose Route Frequency Provider Last Rate Last Dose  . acetaminophen (TYLENOL) tablet 650 mg  650 mg Oral Q4H PRN Dalia Heading, PA-C      . ibuprofen (ADVIL,MOTRIN) tablet 600 mg  600 mg Oral Q8H PRN Dalia Heading, PA-C      .  LORazepam (ATIVAN) tablet 1 mg  1 mg Oral Q8H PRN Dalia Heading, PA-C      . ondansetron (ZOFRAN) tablet 4 mg  4 mg Oral Q8H PRN Christopher Lawyer, PA-C      . QUEtiapine (SEROQUEL) tablet 150 mg  150 mg Oral QHS Mojeed Akintayo       Current Outpatient Prescriptions  Medication Sig Dispense Refill  . FLUoxetine (PROZAC) 40 MG capsule Take 40 mg by mouth daily.    Marland Kitchen ibuprofen (ADVIL,MOTRIN) 200 MG tablet Take 400 mg by mouth every 6 (six) hours as needed for moderate pain (pain).    . predniSONE (DELTASONE) 20 MG tablet 3 tabs po day one, then 2 tabs daily x 4 days 11 tablet 0  . QUEtiapine (SEROQUEL) 300 MG tablet Take 150 mg by mouth at bedtime.    . cyclobenzaprine (FLEXERIL) 10 MG tablet Take 1 tablet (10 mg total) by mouth at bedtime. (Patient not taking: Reported on  03/11/2015) 30 tablet 0  . diazepam (VALIUM) 5 MG tablet Take 1 tablet (5 mg total) by mouth at bedtime as needed for muscle relaxer. (Patient not taking: Reported on 03/11/2015) 6 tablet 0  . oxyCODONE-acetaminophen (PERCOCET) 5-325 MG per tablet Take 1 tablet by mouth every 6 (six) hours as needed for pain. (Patient not taking: Reported on 03/11/2015) 20 tablet 0    Musculoskeletal: Strength & Muscle Tone: within normal limits Gait & Station: normal Patient leans: N/A  Psychiatric Specialty Exam: Review of Systems  Constitutional: Negative.   HENT: Negative.   Eyes: Negative.   Respiratory: Negative.   Cardiovascular: Negative.   Gastrointestinal: Negative.   Genitourinary: Negative.   Musculoskeletal: Negative.   Skin: Negative.   Neurological: Negative.   Endo/Heme/Allergies: Negative.     Blood pressure 101/73, pulse 64, temperature 98 F (36.7 C), temperature source Oral, resp. rate 16, height 5' 5"  (1.651 m), weight 86.183 kg (190 lb), SpO2 100 %.Body mass index is 31.62 kg/(m^2).  General Appearance: Casual and Disheveled  Eye Contact::  Good  Speech:  Clear and Coherent and Normal Rate  Volume:  Normal  Mood:  Angry, Anxious, Depressed, Hopeless and Worthless  Affect:  Congruent and Depressed  Thought Process:  Coherent, Goal Directed and Intact  Orientation:  Full (Time, Place, and Person)  Thought Content:  WDL  Suicidal Thoughts:  Yes.  with intent/plan  Homicidal Thoughts:  No  Memory:  Immediate;   Good Recent;   Good Remote;   Good  Judgement:  Poor  Insight:  Shallow  Psychomotor Activity:  Psychomotor Retardation  Concentration:  Good  Recall:  NA  Fund of Knowledge:Poor  Language: Good  Akathisia:  NA  Handed:  Right  AIMS (if indicated):     Assets:  Desire for Improvement  ADL's:  Impaired  Cognition: WNL  Sleep:      Treatment Plan Summary: Daily contact with patient to assess and evaluate symptoms and progress in treatment and Medication  management  Disposition:   Accepted for admission and we will be seeking placement at any facility with available bed.  We will resume home medications.  Delfin Gant   PMHNP-BC 10/30/2015 2:11 PM Patient seen face-to-face for psychiatric evaluation, chart reviewed and case discussed with the physician extender and developed treatment plan. Reviewed the information documented and agree with the treatment plan. Corena Pilgrim, MD

## 2015-10-30 NOTE — ED Notes (Signed)
Pt AAO x 3, sleeping at present, easily arouseable to verbal stimuli.  Remains passive SI.  Monitoring for safety, Q 15 min checks in effect.

## 2015-10-30 NOTE — Progress Notes (Signed)
Pt spent majority of this shift asleep in bed. No behavioral outburst to note thus far. Pt is guarded, presents with flat affect and depressed mood. Denied SI, HI, AVH and pain. Pt did state that she has not been happy lately related to current legal and financial issues. Adequate nutritional needs met. Q 15 minutes checks maintained for safety without incident to report at this time. Support and availability offered.

## 2015-10-30 NOTE — ED Provider Notes (Signed)
CSN: [id number]     Arrival date & time 10/29/15  1847 History   First MD Initiated Contact with Patient 10/29/15 2000     Chief Complaint  Patient presents with  . Suicide Attempt     (Consider location/radiation/quality/duration/timing/severity/associated sxs/prior Treatment) HPI Patient presents to the emergency department with depression and suicide attempt by taking an overdose of Seroquel.  She states she took 7  Seroquel around 4:30.  Denies taking any other medications in excess.  Patient denies chest pain, shortness of breath, weakness, dizziness, headache, blurred vision, cough, fever, urinary incontinence, hallucinations, or syncope.  The patient states that nothing seems make her condition better or worse.  Patient states she is under a lot of stress.  There is a lot of things going on in her life that have contributed to this Past Medical History  Diagnosis Date  . Anxiety   . Substance abuse   . Depression   . High cholesterol   . Bipolar 1 disorder [facility])    Past Surgical History  Procedure Laterality Date  . Tubal ligation     Family History  Problem Relation Age of Onset  . Cancer Maternal Grandmother     breast   Social History  Substance Use Topics  . Smoking status: Current Every Day Smoker    Types: Cigarettes  . Smokeless tobacco: None  . Alcohol Use: Yes   OB History    No data available     Review of Systems  All other systems negative except as documented in the HPI. All pertinent positives and negatives as reviewed in the HPI.  Allergies  Ceclor; Sertraline; and Ceclor  Home Medications   Prior to Admission medications   Medication Sig Start Date End Date Taking? Authorizing Provider  FLUoxetine (PROZAC) 40 MG capsule Take 40 mg by mouth daily.   Yes Historical Provider, MD  ibuprofen (ADVIL,MOTRIN) 200 MG tablet Take 400 mg by mouth every 6 (six) hours as needed for moderate pain (pain).   Yes Historical Provider, MD  predniSONE  (DELTASONE) 20 MG tablet 3 tabs po day one, then 2 tabs daily x 4 days 12/25/14  Yes Domenic Moras, PA-C  QUEtiapine (SEROQUEL) 300 MG tablet Take 150 mg by mouth at bedtime.   Yes Historical Provider, MD  cyclobenzaprine (FLEXERIL) 10 MG tablet Take 1 tablet (10 mg total) by mouth at bedtime. Patient not taking: Reported on 03/11/2015 12/25/14   Domenic Moras, PA-C  diazepam (VALIUM) 5 MG tablet Take 1 tablet (5 mg total) by mouth at bedtime as needed for muscle relaxer. Patient not taking: Reported on 03/11/2015 06/07/13   Marny Lowenstein, PA-C  oxyCODONE-acetaminophen (PERCOCET) 5-325 MG per tablet Take 1 tablet by mouth every 6 (six) hours as needed for pain. Patient not taking: Reported on 03/11/2015 06/07/13   Marny Lowenstein, PA-C   BP 102/64 mmHg  Pulse 74  Temp(Src) 97.9 F (36.6 C) (Oral)  Resp 18  Ht 5' 5"  (1.651 m)  Wt 86.183 kg  BMI 31.62 kg/m2  SpO2 99% Physical Exam  Constitutional: She is oriented to person, place, and time. She appears well-developed and well-nourished. No distress.  HENT:  Head: Normocephalic and atraumatic.  Mouth/Throat: Oropharynx is clear and moist.  Eyes: Pupils are equal, round, and reactive to light.  Neck: Normal range of motion. Neck supple.  Cardiovascular: Normal rate, regular rhythm and normal heart sounds.  Exam reveals no gallop and no friction rub.   No murmur heard. Pulmonary/Chest: Effort  normal and breath sounds normal. No respiratory distress. She has no wheezes.  Neurological: She is alert and oriented to person, place, and time. She exhibits normal muscle tone. Coordination normal.  Skin: Skin is warm and dry. No rash noted. No erythema.  Psychiatric: She has a normal mood and affect. Her behavior is normal. She expresses suicidal ideation. She expresses no homicidal ideation. She expresses suicidal plans. She expresses no homicidal plans.  Nursing note and vitals reviewed.   ED Course  Procedures (including critical care time) Labs  Review Labs Reviewed  COMPREHENSIVE METABOLIC PANEL - Abnormal; Notable for the following:    Potassium 3.3 (*)    Total Bilirubin 0.2 (*)    All other components within normal limits  ACETAMINOPHEN LEVEL - Abnormal; Notable for the following:    Acetaminophen (Tylenol), Serum <10 (*)    All other components within normal limits  CBC - Abnormal; Notable for the following:    WBC 15.6 (*)    Hemoglobin 11.9 (*)    Platelets 481 (*)    All other components within normal limits  URINE RAPID DRUG SCREEN, HOSP PERFORMED - Abnormal; Notable for the following:    Cocaine POSITIVE (*)    Tetrahydrocannabinol POSITIVE (*)    All other components within normal limits  ETHANOL  SALICYLATE LEVEL  I-STAT BETA HCG BLOOD, ED ([facility], [facility], [facility] ONLY)    Imaging Review No results found. I have personally reviewed and evaluated these images and lab results as part of my medical decision-making.  She will need TTS assessment.  Patient is otherwise stable at this time     Dalia Heading, PA-C 10/30/15 0013  Sherwood Gambler, MD 11/01/15 [phone]
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
## 2015-10-31 ENCOUNTER — Inpatient Hospital Stay (HOSPITAL_COMMUNITY)
Admission: AD | Admit: 2015-10-31 | Discharge: 2015-11-04 | DRG: 885 | Disposition: A | Discharge status: Home / Self Care | Payer: Medicaid Other | Source: Intra-hospital | Attending: Psychiatry | Admitting: Psychiatry

## 2015-10-31 ENCOUNTER — Encounter (HOSPITAL_COMMUNITY): Payer: Self-pay

## 2015-10-31 DIAGNOSIS — F1220 Cannabis dependence, uncomplicated: Secondary | ICD-10-CM | POA: Diagnosis present

## 2015-10-31 DIAGNOSIS — F3132 Bipolar disorder, current episode depressed, moderate: Secondary | ICD-10-CM | POA: Diagnosis present

## 2015-10-31 DIAGNOSIS — F319 Bipolar disorder, unspecified: Secondary | ICD-10-CM | POA: Diagnosis present

## 2015-10-31 DIAGNOSIS — F3130 Bipolar disorder, current episode depressed, mild or moderate severity, unspecified: Secondary | ICD-10-CM | POA: Diagnosis present

## 2015-10-31 DIAGNOSIS — F17210 Nicotine dependence, cigarettes, uncomplicated: Secondary | ICD-10-CM | POA: Diagnosis present

## 2015-10-31 DIAGNOSIS — F314 Bipolar disorder, current episode depressed, severe, without psychotic features: Secondary | ICD-10-CM | POA: Diagnosis not present

## 2015-10-31 DIAGNOSIS — F1420 Cocaine dependence, uncomplicated: Secondary | ICD-10-CM | POA: Clinically undetermined

## 2015-10-31 DIAGNOSIS — R45851 Suicidal ideations: Secondary | ICD-10-CM | POA: Diagnosis not present

## 2015-10-31 DIAGNOSIS — T43592A Poisoning by other antipsychotics and neuroleptics, intentional self-harm, initial encounter: Secondary | ICD-10-CM | POA: Diagnosis not present

## 2015-10-31 DIAGNOSIS — F1910 Other psychoactive substance abuse, uncomplicated: Secondary | ICD-10-CM | POA: Diagnosis not present

## 2015-10-31 MED ORDER — LORAZEPAM 1 MG PO TABS: 1.0000 mg | ORAL_TABLET | Freq: Three times a day (TID) | ORAL | Status: DC | PRN

## 2015-10-31 MED ORDER — ACETAMINOPHEN 325 MG PO TABS
650.0000 mg | ORAL_TABLET | ORAL | Status: DC | PRN
  Administered 2015-11-01: 650 mg via ORAL
  Filled 2015-10-31: qty 2

## 2015-10-31 MED ORDER — QUETIAPINE FUMARATE 100 MG PO TABS
150.0000 mg | ORAL_TABLET | Freq: Every day | ORAL | Status: DC
  Administered 2015-10-31: 150 mg via ORAL
  Filled 2015-10-31 (×4): qty 1

## 2015-10-31 MED ORDER — IBUPROFEN 600 MG PO TABS: 600.0000 mg | ORAL_TABLET | Freq: Three times a day (TID) | ORAL | Status: DC | PRN

## 2015-10-31 MED ORDER — FLUOXETINE HCL 20 MG PO CAPS
40.0000 mg | ORAL_CAPSULE | Freq: Every day | ORAL | Status: DC
  Administered 2015-11-01: 40 mg via ORAL
  Filled 2015-10-31 (×3): qty 2

## 2015-10-31 MED ORDER — FLUOXETINE HCL 20 MG PO CAPS
40.0000 mg | ORAL_CAPSULE | Freq: Every day | ORAL | Status: DC
  Administered 2015-10-31: 40 mg via ORAL
  Filled 2015-10-31: qty 2

## 2015-10-31 MED ORDER — ONDANSETRON HCL 4 MG PO TABS: 4.0000 mg | ORAL_TABLET | Freq: Three times a day (TID) | ORAL | Status: DC | PRN

## 2015-10-31 NOTE — BH Assessment (Signed)
[place] Assessment Progress Note  Per Corena Pilgrim, MD, this pt requires psychiatric hospitalization at this time.  Letitia Libra, RN, [facility] has assigned pt to [facility] Rm [id number].  Pt has signed Voluntary Admission and Consent for Treatment, as well as Consent to Release Information to [facility], her outpatient provider, and a notification call has been placed.  Signed forms have been faxed to [facility].  Pt's nurse, Nena Jordan, has been notified, and agrees to send original paperwork along with pt via [facility]ries, and to call report to [phone].  Jalene Mullet, [place] Triage Specialist [phone]

## 2015-10-31 NOTE — Tx Team (Signed)
Initial Interdisciplinary Treatment Plan   PATIENT STRESSORS: Financial difficulties Legal issue Substance abuse   PATIENT STRENGTHS: Communication skills Physical Health Supportive family/friends   PROBLEM LIST: Problem List/Patient Goals Date to be addressed Date deferred Reason deferred Estimated date of resolution  ' voices to go away' 10/31/15     " have a reason to smile" 10/31/15     Substance abuse 10/31/15     Depression 10/31/15                                    DISCHARGE CRITERIA:  Ability to meet basic life and health needs Improved stabilization in mood, thinking, and/or behavior Motivation to continue treatment in a less acute level of care Reduction of life-threatening or endangering symptoms to within safe limits  PRELIMINARY DISCHARGE PLAN: Attend aftercare/continuing care group Attend 12-step recovery group Outpatient therapy  PATIENT/FAMIILY INVOLVEMENT: This treatment plan has been presented to and reviewed with the patient, Michele Webster, and/or family member.  The patient and family have been given the opportunity to ask questions and make suggestions.  Wolfgang Phoenix 10/31/2015, 5:01 PM

## 2015-10-31 NOTE — Progress Notes (Signed)
This is a 39 yr old female who came to [facility] from [facility] after attempting to kill her self by over dosing on 6-169m Seroquel. Pt was alert and oriented during admission. Pt was cooperative and answered all the questions asked. Pt was little tearing but was able to recollect herself. Pt denied SI/HI, but was having auditory hallucinations. Pt contracted for safety. Pt was introduced to the unit and showed her room. Safety maintained on Q.15 min checks, will continue to monitor.

## 2015-10-31 NOTE — ED Notes (Signed)
Pt states she is not actively suicidal but is scared to be discharged yet.  She states she hears voices telling her she is worthless.  Pt. Contracts for safety.  15 minute checks and video monitoring continue.

## 2015-10-31 NOTE — Consult Note (Signed)
  Psychiatric Specialty Exam: Physical Exam  ROS  Blood pressure 127/63, pulse 64, temperature 97.7 F (36.5 C), temperature source Oral, resp. rate 18, height 5' 5"  (1.651 m), weight 86.183 kg (190 lb), SpO2 100 %.Body mass index is 31.62 kg/(m^2).  General Appearance: Casual and Disheveled  Eye Contact:: Good  Speech: Clear and Coherent and Normal Rate  Volume: Normal  Mood: Angry, Anxious, Depressed, Hopeless and Worthless  Affect: Congruent and Depressed  Thought Process: Coherent, Goal Directed and Intact  Orientation: Full (Time, Place, and Person)  Thought Content: WDL  Suicidal Thoughts: Yes. with intent/plan  Homicidal Thoughts: No  Memory: Immediate; Good Recent; Good Remote; Good  Judgement: Poor  Insight: Shallow  Psychomotor Activity: Psychomotor Retardation  Concentration: Good  Recall: NA  Fund of Knowledge:Poor  Language: Good  Akathisia: NA  Handed: Right  AIMS (if indicated):    Assets: Desire for Improvement  ADL's: Impaired  Cognition: WNL         Assessed patient this morning and she is still endorsing suicide and could not contract for safety.  She reports hearing voices telling her to kill herself and telling her that she is no good.  Patient reports good sleep last night but states she is afraid to leave the hospital at this time.  Patient at this time still meets criteria for inpatient hospitalization.  Bipolar 1 disorder, depressed ([place])   Plan:  Continue inpatient placement, offer medications.   Charmaine Downs   PMHNP-BC Patient seen face-to-face for psychiatric evaluation, chart reviewed and case discussed with the physician extender and developed treatment plan. Reviewed the information documented and agree with the treatment plan. Corena Pilgrim, MD

## 2015-10-31 NOTE — Progress Notes (Signed)
Adult Psychoeducational Group Note  Date:  10/31/2015 Time:  9:51 PM  Group Topic/Focus:  Wrap-Up Group:   The focus of this group is to help patients review their daily goal of treatment and discuss progress on daily workbooks.  Participation Level:  Active  Participation Quality:  Appropriate  Affect:  Appropriate  Cognitive:  Alert  Insight: Appropriate  Engagement in Group:  Engaged  Modes of Intervention:  Discussion  Additional Comments:  Pt stated that this is her first full day here and it was long and frustrating. She hopes that tomorrow will be a better day. She was also able to speak with her mom whom she hasn't spoken to in two weeks.   Wynelle Fanny R 10/31/2015, 9:51 PM

## 2015-10-31 NOTE — ED Notes (Signed)
Pt discharged ambulatory with [place] driver.  Pt. Was in no distress at d/c.  All belongings were returned to patient.

## 2015-11-01 ENCOUNTER — Encounter (HOSPITAL_COMMUNITY): Payer: Self-pay | Admitting: Psychiatry

## 2015-11-01 DIAGNOSIS — F1420 Cocaine dependence, uncomplicated: Secondary | ICD-10-CM | POA: Clinically undetermined

## 2015-11-01 DIAGNOSIS — F3130 Bipolar disorder, current episode depressed, mild or moderate severity, unspecified: Principal | ICD-10-CM

## 2015-11-01 DIAGNOSIS — F1220 Cannabis dependence, uncomplicated: Secondary | ICD-10-CM | POA: Diagnosis present

## 2015-11-01 DIAGNOSIS — F1910 Other psychoactive substance abuse, uncomplicated: Secondary | ICD-10-CM

## 2015-11-01 MED ORDER — LIDOCAINE 5 % EX PTCH
1.0000 | MEDICATED_PATCH | CUTANEOUS | Status: DC
  Administered 2015-11-01 – 2015-11-04 (×4): 1 via TRANSDERMAL
  Filled 2015-11-01 (×5): qty 1

## 2015-11-01 MED ORDER — HYDROXYZINE HCL 50 MG PO TABS
50.0000 mg | ORAL_TABLET | Freq: Every evening | ORAL | Status: DC | PRN
Start: 2015-11-01 — End: 2015-11-04

## 2015-11-01 MED ORDER — HYDROXYZINE HCL 25 MG PO TABS: 25.0000 mg | ORAL_TABLET | Freq: Three times a day (TID) | ORAL | Status: DC | PRN

## 2015-11-01 MED ORDER — POTASSIUM CHLORIDE CRYS ER 20 MEQ PO TBCR
20.0000 meq | EXTENDED_RELEASE_TABLET | Freq: Two times a day (BID) | ORAL | Status: DC
  Administered 2015-11-01 – 2015-11-03 (×5): 20 meq via ORAL
  Filled 2015-11-01 (×8): qty 1

## 2015-11-01 MED ORDER — IBUPROFEN 800 MG PO TABS
800.0000 mg | ORAL_TABLET | Freq: Three times a day (TID) | ORAL | Status: DC | PRN
  Administered 2015-11-01 – 2015-11-04 (×7): 800 mg via ORAL
  Filled 2015-11-01 (×8): qty 1

## 2015-11-01 MED ORDER — RAMELTEON 8 MG PO TABS
8.0000 mg | ORAL_TABLET | Freq: Every day | ORAL | Status: DC
  Administered 2015-11-01 – 2015-11-03 (×3): 8 mg via ORAL
  Filled 2015-11-01 (×5): qty 1

## 2015-11-01 MED ORDER — FLUOXETINE HCL 20 MG PO CAPS
20.0000 mg | ORAL_CAPSULE | Freq: Every day | ORAL | Status: DC
  Administered 2015-11-02: 20 mg via ORAL
  Filled 2015-11-01 (×2): qty 1
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
## 2015-11-01 NOTE — Progress Notes (Signed)
Pt reports she was admitted today and was quite anxious about being here.  She says that she has been able to calm herself down, and that she was able to talk with her mother who has not spoken to her for weeks.  She said she was encouraged that her mother hugged her and told her that she loved her.  She reports that she is still hearing the voices, but they are not as loud or bold.  She is hopeful to find medication to stop the voices completely.  Pt denies SI/HI/AVH at this time.  She is pleasant and cooperative with Probation officer.  Discussed scheduled evening meds with pt.  Pt voiced understanding.  Support and encouragement offered.  Pt plans to return home at discharge.  Safety maintained with q15 minute checks.
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
## 2015-11-01 NOTE — BHH Group Notes (Signed)
[facility] Group Notes:  (Nursing/MHT/Case Management/Adjunct)  Date:  11/01/2015  Time:  12:33 PM  Type of Therapy:  Nurse Education  Participation Level:  Active  Participation Quality:  Appropriate and Attentive  Affect:  Appropriate  Cognitive:  Alert and Appropriate  Insight:  Appropriate, Good and Improving  Engagement in Group:  Engaged and Improving  Modes of Intervention:  Discussion, Education, Exploration and Support  Summary of Progress/Problems: Discussed recovery and the process of recovery.  Patient was receptive and participated.  Patient states goal is to stay happy all day. Mart Piggs 11/01/2015, 12:33 PM
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
## 2015-11-01 NOTE — BHH Suicide Risk Assessment (Signed)
[facility] INPATIENT:  Family/Significant Other Suicide Prevention Education  Suicide Prevention Education:  Patient Refusal for Family/Significant Other Suicide Prevention Education: The patient Michele Webster has refused to provide written consent for family/significant other to be provided Family/Significant Other Suicide Prevention Education during admission and/or prior to discharge.  Physician notified.  CSW completed with pt.  Alphonse Guild Riffey 11/01/2015, 10:59 AM

## 2015-11-01 NOTE — BHH Group Notes (Signed)
[place] LCSW Group Therapy 11/01/2015 1:15 PM Type of Therapy: Group Therapy Participation Level: Active  Participation Quality: Attentive, Sharing and Supportive  Affect: Depressed and Flat  Cognitive: Alert and Oriented  Insight: Developing/Improving and Engaged  Engagement in Therapy: Developing/Improving and Engaged  Modes of Intervention: Activity, Clarification, Confrontation, Discussion, Education, Exploration, Limit-setting, Orientation, Problem-solving, Rapport Building, Art therapist, Socialization and Support  Summary of Progress/Problems: Patient was attentive and engaged with speaker from [place]. Patient was attentive to speaker while they shared their story of dealing with mental health and overcoming it. Patient expressed interest in their programs and services and received information on their agency. Patient processed ways they can relate to the speaker.   Tilden Fossa, MSW, [place] Worker [facility] [phone]

## 2015-11-01 NOTE — Progress Notes (Signed)
DAR NOTE: Patient is calm and pleasant.  Denies auditory and visual hallucinations.  Rates depression at 4, hopelessness at 5, and anxiety at 5.  Reports concentration and appetite as good with energy at normal level.  Maintained on routine safety checks.  Medications given as prescribed.  Support and encouragement offered as needed.  Attended group and participated.  States goal for today is "be happy all day."  Patient observed socializing with peers in the dayroom.  Received Tylenol 650 mg and Ibuprofen for knee pain with good effect.

## 2015-11-01 NOTE — H&P (Signed)
Psychiatric Admission Assessment Adult  Patient Identification: Michele Webster  MRN:  [id number]  Date of Evaluation:  11/01/2015  Chief Complaint:  Bipolar disorder Cocaine Use disorder  Principal Diagnosis: Bipolar affective disorder, depressed, Polysubstance dependence Diagnosis:   Patient Active Problem List   Diagnosis Date Noted  . Bipolar disorder with current episode depressed ([place]) [F31.30] 10/31/2015  . Bipolar 1 disorder, depressed ([place]) [F31.9] 10/30/2015  . Suicide attempt ([place]) [T14.91]   . Cocaine abuse [F14.10] 01/24/2014  . Polysubstance abuse [F19.10] 01/24/2014  . Depression [F32.9] 01/24/2014  . Anal fissure [K60.2] 02/05/2012   History of Present Illness: Michele Webster is a 39 year old Caucasian female. Admitted to the [facility] adult unit with complaints of suicide attempt by overdose. She reports during this assessment; "I was taken to the [facility] ED yesterday. I had wanted to go to sleep & not wake up. So, I took a bunch of my Seroquel pills. They are150 mg each. I have been on this medication for 1 year for Bipolar disorder & Insomnia. I did this because I feel overwhelmed. There are a lot of shit going on in my life. I have been severely depressed since September after spending 3 weeks in jail. I got out of jail on the 28th of September, 2016. I'm currently looking at going back to prison for 2 & 1/2 years for; larceny on an employer, conspiracy in [place] abetting a minor & possession of drugs & drug paraphernalia. One of the charges has been moved up to the superior court. My lawyer is very concerned. I receive my mental health treatment at [facility]. I hear voices, but the voices are now softer. My mood is improving, will even improve more if I can stay off of drugs. I smoke weed & use cocaine"   Associated Signs/Symptoms:  Depression Symptoms:  depressed mood, insomnia, feelings of worthlessness/guilt, hopelessness, anxiety, loss of  energy/fatigue,  (Hypo) Manic Symptoms:  Impulsivity, Labiality of Mood,  Anxiety Symptoms:  Excessive Worry,  Psychotic Symptoms:  Hallucinations: Auditory  PTSD Symptoms: NA  Total Time spent with patient: 1 hour  Past Psychiatric History: Bipolar affective disorder  Risk to Self: Is patient at risk for suicide?: No Risk to Others: No Prior Inpatient Therapy: Yes Prior Outpatient Therapy: Yes [facility])  Alcohol Screening: Patient refused Alcohol Screening Tool: Yes 1. How often do you have a drink containing alcohol?: Never 2. How many drinks containing alcohol do you have on a typical day when you are drinking?: 1 or 2 3. How often do you have six or more drinks on one occasion?: Never Preliminary Score: 0 4. How often during the last year have you found that you were not able to stop drinking once you had started?: Never 5. How often during the last year have you failed to do what was normally expected from you becasue of drinking?: Never 6. How often during the last year have you needed a first drink in the morning to get yourself going after a heavy drinking session?: Never 7. How often during the last year have you had a feeling of guilt of remorse after drinking?: Never 8. How often during the last year have you been unable to remember what happened the night before because you had been drinking?: Never 9. Have you or someone else been injured as a result of your drinking?: No 10. Has a relative or friend or a doctor or another health worker been concerned about your drinking or suggested you  cut down?: No Alcohol Use Disorder Identification Test Final Score (AUDIT): 0 Brief Intervention: AUDIT score less than 7 or less-screening does not suggest unhealthy drinking-brief intervention not indicated  Substance Abuse History in the last 12 months:  Yes.    Consequences of Substance Abuse: Medical Consequences:  Liver damage, Possible death by overdose Legal Consequences:   Arrests, jail time, Loss of driving privilege. Family Consequences:  Family discord, divorce and or separation.  Previous Psychotropic Medications: Yes   Psychological Evaluations: Yes   Past Medical History:  Past Medical History  Diagnosis Date  . Anxiety   . Substance abuse   . Depression   . High cholesterol   . Bipolar 1 disorder [facility])     Past Surgical History  Procedure Laterality Date  . Tubal ligation     Family History:  Family History  Problem Relation Age of Onset  . Cancer Maternal Grandmother     breast   Family Psychiatric  History: None reported  Social History:  History  Alcohol Use  . Yes     History  Drug Use  . 7.00 per week  . Special: Marijuana, Cocaine    Social History   Social History  . Marital Status: Legally Separated    Spouse Name: N/A  . Number of Children: N/A  . Years of Education: N/A   Social History Main Topics  . Smoking status: Current Every Day Smoker    Types: Cigarettes  . Smokeless tobacco: None  . Alcohol Use: Yes  . Drug Use: 7.00 per week    Special: Marijuana, Cocaine  . Sexual Activity: No   Other Topics Concern  . None   Social History Narrative   ** Merged History Encounter **       Additional Social History:    Prescriptions: See PTA list History of alcohol / drug use?: Yes Longest period of sobriety (when/how long): 6 years Withdrawal Symptoms: Other (Comment) (none) Name of Substance 1: Cocaine 1 - Age of First Use: 20s 1 - Amount (size/oz): $100 1 - Frequency: day 1 - Duration: 12 yrs 1 - Last Use / Amount: today Name of Substance 2: Marijuana 2 - Age of First Use: 10 2 - Amount (size/oz): a dime bag 2 - Frequency: daily 2 - Duration: everyday since 39 yo 2 - Last Use / Amount: today Name of Substance 3: Alcohol 3 - Age of First Use: teens 3 - Amount (size/oz): 2 beers 3 - Frequency: day 3 - Duration: started back in 2016 (no drinking from 2012-2016)  Allergies:   Allergies   Allergen Reactions  . Ceclor [Cefaclor] Hives  . Sertraline Other (See Comments)    Makes her crazy  . Ceclor [Cefaclor] Hives, Nausea And Vomiting and Swelling   Lab Results: No results found for this or any previous visit (from the past 48 hour(s)).  Metabolic Disorder Labs:  No results found for: HGBA1C, MPG No results found for: PROLACTIN No results found for: CHOL, TRIG, HDL, CHOLHDL, VLDL, LDLCALC  Current Medications: Current Facility-Administered Medications  Medication Dose Route Frequency Provider Last Rate Last Dose  . acetaminophen (TYLENOL) tablet 650 mg  650 mg Oral Q4H PRN Delfin Gant, NP   650 mg at 11/01/15 0759  . FLUoxetine (PROZAC) capsule 40 mg  40 mg Oral Daily Delfin Gant, NP   40 mg at 11/01/15 0759  . ibuprofen (ADVIL,MOTRIN) tablet 600 mg  600 mg Oral Q8H PRN Delfin Gant, NP      .  LORazepam (ATIVAN) tablet 1 mg  1 mg Oral Q8H PRN Delfin Gant, NP      . ondansetron (ZOFRAN) tablet 4 mg  4 mg Oral Q8H PRN Delfin Gant, NP      . QUEtiapine (SEROQUEL) tablet 150 mg  150 mg Oral QHS Delfin Gant, NP   150 mg at 10/31/15 2131   PTA Medications: Prescriptions prior to admission  Medication Sig Dispense Refill Last Dose  . FLUoxetine (PROZAC) 40 MG capsule Take 40 mg by mouth daily.   10/29/2015 at Unknown time  . ibuprofen (ADVIL,MOTRIN) 200 MG tablet Take 400 mg by mouth every 6 (six) hours as needed for moderate pain (pain).   Past Month at Unknown time  . QUEtiapine (SEROQUEL) 300 MG tablet Take 150 mg by mouth at bedtime.   10/28/2015 at Unknown time   Musculoskeletal: Strength & Muscle Tone: within normal limits Gait & Station: normal Patient leans: N/A  Psychiatric Specialty Exam: Physical Exam  Constitutional: She is oriented to person, place, and time. She appears well-developed.  HENT:  Head: Normocephalic.  Eyes: Pupils are equal, round, and reactive to light.  Neck: Normal range of motion.   Cardiovascular: Normal rate.   Respiratory: Effort normal.  GI: Soft.  Genitourinary:  Denies any issues in this area   Musculoskeletal: Normal range of motion.  Neurological: She is alert and oriented to person, place, and time.  Skin: Skin is warm and dry.  Psychiatric: Her speech is normal and behavior is normal. Thought content normal. Her mood appears anxious. Her affect is not angry, not blunt, not labile and not inappropriate. Cognition and memory are normal. She expresses impulsivity. She does not express inappropriate judgment. She exhibits a depressed mood.    Review of Systems  Constitutional: Positive for malaise/fatigue.  HENT: Negative.   Eyes: Negative.   Respiratory: Negative.   Cardiovascular: Negative.   Gastrointestinal: Negative.   Genitourinary: Negative.   Musculoskeletal: Negative.   Skin: Negative.   Neurological: Positive for dizziness and weakness.  Endo/Heme/Allergies: Negative.   Psychiatric/Behavioral: Positive for depression, suicidal ideas and substance abuse (Hx Cocaine & THC dependence). Negative for hallucinations and memory loss. The patient is nervous/anxious and has insomnia.     Blood pressure 123/79, pulse 70, temperature 97.9 F (36.6 C), temperature source Oral, resp. rate 16, height 5' 5"  (1.651 m), weight 79.379 kg (175 lb), SpO2 100 %.Body mass index is 29.12 kg/(m^2).  General Appearance: Casual  Eye Contact::  Good  Speech:  Clear and Coherent  Volume:  Normal  Mood:  Anxious, Depressed and worried  Affect:  Depressed and Flat  Thought Process:  Coherent and Intact  Orientation:  Full (Time, Place, and Person)  Thought Content:  Hallucinations: Auditory and Rumination  Suicidal Thoughts:  No  Homicidal Thoughts:  No  Memory:  Grossly intact  Judgement:  Fair  Insight:  Present  Psychomotor Activity:  Decreased  Concentration:  Fair  Recall:  Good  Fund of Knowledge:Good  Language: Good  Akathisia:  No  Handed:  Right   AIMS (if indicated):     Assets:  Communication Skills Desire for Improvement  ADL's:  Intact  Cognition: WNL  Sleep:      Treatment Plan/Recommendations: 1. Admit for crisis management and stabilization, estimated length of stay 3-5 days.  2. Medication management to reduce current symptoms to base line and improve the patient's overall level of functioning; Prozac 20 mg for depression, Hydroxyzine 25 & 50 mg respectively  mg for anxiety, Rozerem 8 mg for insomnia, lorazepam 1 mg for anxiety 3. Treat health problems as indicated; obtain EKG, Kdur 20 meq bid x 3 days for low potassium, Lidocaine patch for pain, obtain TSH in am  4. Develop treatment plan to decrease risk of relapse upon discharge and the need for readmission.  5. Psycho-social education regarding relapse prevention and self care.  6. Health care follow up as needed for medical problems.  7. Review, reconcile, and reinstate any pertinent home medications for other health issues where appropriate. 8. Call for consults with hospitalist for any additional specialty patient care services as needed.  Observation Level/Precautions:  15 minute checks  Laboratory:  Per ED, UDS positive for THC & Cocaine  Psychotherapy: Group sessions, AA/NA meetings    Medications: See MAR for medication lists  Consultations: As Needed  Discharge Concerns: Safety, mood stability  Estimated LOS: 2-4 days  Other:     I certify that inpatient services furnished can reasonably be expected to improve the patient's condition.   Encarnacion Slates, PMHNP, FNP-BC 12/1/201610:36 AM

## 2015-11-02 ENCOUNTER — Other Ambulatory Visit: Payer: Self-pay

## 2015-11-02 DIAGNOSIS — F3132 Bipolar disorder, current episode depressed, moderate: Secondary | ICD-10-CM

## 2015-11-02 DIAGNOSIS — F1220 Cannabis dependence, uncomplicated: Secondary | ICD-10-CM

## 2015-11-02 DIAGNOSIS — F1420 Cocaine dependence, uncomplicated: Secondary | ICD-10-CM

## 2015-11-02 LAB — TSH: TSH: 2.044 u[IU]/mL (ref 0.350–4.500)

## 2015-11-02 MED ORDER — QUETIAPINE FUMARATE 100 MG PO TABS
100.0000 mg | ORAL_TABLET | Freq: Every day | ORAL | Status: DC
  Administered 2015-11-02 – 2015-11-03 (×2): 100 mg via ORAL
  Filled 2015-11-02 (×5): qty 1

## 2015-11-02 MED ORDER — FLUOXETINE HCL 20 MG PO CAPS
40.0000 mg | ORAL_CAPSULE | Freq: Every day | ORAL | Status: DC
  Administered 2015-11-03 – 2015-11-04 (×2): 40 mg via ORAL
  Filled 2015-11-02 (×4): qty 2

## 2015-11-02 NOTE — Progress Notes (Signed)
[facility] MD Progress Note  11/02/2015 11:23 AM Michele Webster  MRN:  [id number] Subjective:  Found out she has to spent prison time. Lost job her job, her  car broke down, lots of stress. Already served 19 days and got out. Got overwhelmed. Has been a user on and off. Was staying with mother and 3 kids 64 13 61. All her troubles are  drug related.  Principal Problem: Bipolar disorder with current episode depressed ([place]) Diagnosis:   Patient Active Problem List   Diagnosis Date Noted  . Cocaine use disorder, moderate, dependence ([place]) [F14.20] 11/01/2015  . Cannabis use disorder, moderate, dependence ([place]) [F12.20] 11/01/2015  . Bipolar disorder with current episode depressed ([place]) [F31.30] 10/31/2015  . Suicide attempt ([place]) [T14.91]   . Cocaine abuse [F14.10] 01/24/2014  . Polysubstance abuse [F19.10] 01/24/2014  . Depression [F32.9] 01/24/2014  . Anal fissure [K60.2] 02/05/2012   Total Time spent with patient: 30 minutes  Past Psychiatric History: See admission H and P  Past Medical History:  Past Medical History  Diagnosis Date  . Anxiety   . Substance abuse   . Depression   . High cholesterol   . Bipolar 1 disorder [facility])     Past Surgical History  Procedure Laterality Date  . Tubal ligation     Family History:  Family History  Problem Relation Age of Onset  . Cancer Maternal Grandmother     breast   Family Psychiatric  History: see admission H and P Social History:  History  Alcohol Use  . Yes     History  Drug Use  . 7.00 per week  . Special: Marijuana, Cocaine    Social History   Social History  . Marital Status: Legally Separated    Spouse Name: N/A  . Number of Children: N/A  . Years of Education: N/A   Social History Main Topics  . Smoking status: Current Every Day Smoker    Types: Cigarettes  . Smokeless tobacco: None  . Alcohol Use: Yes  . Drug Use: 7.00 per week    Special: Marijuana, Cocaine  . Sexual Activity: No   Other Topics Concern   . None   Social History Narrative   ** Merged History Encounter **       Additional Social History:    Prescriptions: See PTA list History of alcohol / drug use?: Yes Longest period of sobriety (when/how long): 6 years Withdrawal Symptoms: Other (Comment) (none) Name of Substance 1: Cocaine 1 - Age of First Use: 20s 1 - Amount (size/oz): $100 1 - Frequency: day 1 - Duration: 12 yrs 1 - Last Use / Amount: today Name of Substance 2: Marijuana 2 - Age of First Use: 10 2 - Amount (size/oz): a dime bag 2 - Frequency: daily 2 - Duration: everyday since 39 yo 2 - Last Use / Amount: today Name of Substance 3: Alcohol 3 - Age of First Use: teens 3 - Amount (size/oz): 2 beers 3 - Frequency: day 3 - Duration: started back in 2016 (no drinking from 2012-2016)              Sleep: Poor  Appetite:  Fair  Current Medications: Current Facility-Administered Medications  Medication Dose Route Frequency Provider Last Rate Last Dose  . acetaminophen (TYLENOL) tablet 650 mg  650 mg Oral Q4H PRN Delfin Gant, NP   650 mg at 11/01/15 0759  . FLUoxetine (PROZAC) capsule 20 mg  20 mg Oral Daily Ursula Alert, MD  20 mg at 11/02/15 0825  . hydrOXYzine (ATARAX/VISTARIL) tablet 25 mg  25 mg Oral TID PRN Ursula Alert, MD      . hydrOXYzine (ATARAX/VISTARIL) tablet 50 mg  50 mg Oral QHS PRN Ursula Alert, MD      . ibuprofen (ADVIL,MOTRIN) tablet 800 mg  800 mg Oral Q8H PRN Encarnacion Slates, NP   800 mg at 11/02/15 0825  . lidocaine (LIDODERM) 5 % 1 patch  1 patch Transdermal Q24H Ursula Alert, MD   1 patch at 11/02/15 0830  . LORazepam (ATIVAN) tablet 1 mg  1 mg Oral Q8H PRN Delfin Gant, NP      . ondansetron (ZOFRAN) tablet 4 mg  4 mg Oral Q8H PRN Delfin Gant, NP      . potassium chloride SA (K-DUR,KLOR-CON) CR tablet 20 mEq  20 mEq Oral BID Encarnacion Slates, NP   20 mEq at 11/02/15 0825  . ramelteon (ROZEREM) tablet 8 mg  8 mg Oral QHS Ursula Alert, MD   8 mg at  11/01/15 2132    Lab Results:  Results for orders placed or performed during the hospital encounter of 10/31/15 (from the past 48 hour(s))  TSH     Status: None   Collection Time: 11/02/15  6:34 AM  Result Value Ref Range   TSH 2.044 0.350 - 4.500 uIU/mL    Comment: Performed at [facility]    Physical Findings: AIMS: Facial and Oral Movements Muscles of Facial Expression: None, normal Lips and Perioral Area: None, normal Jaw: None, normal Tongue: None, normal,Extremity Movements Upper (arms, wrists, hands, fingers): None, normal Lower (legs, knees, ankles, toes): None, normal, Trunk Movements Neck, shoulders, hips: None, normal, Overall Severity Severity of abnormal movements (highest score from questions above): None, normal Incapacitation due to abnormal movements: None, normal Patient's awareness of abnormal movements (rate only patient's report): No Awareness, Dental Status Current problems with teeth and/or dentures?: No Does patient usually wear dentures?: No  CIWA:  CIWA-Ar Total: 0 COWS:  COWS Total Score: 0  Musculoskeletal: Strength & Muscle Tone: within normal limits Gait & Station: normal Patient leans: normal  Psychiatric Specialty Exam: Review of Systems  Constitutional: Positive for malaise/fatigue.  HENT: Negative.   Eyes: Negative.   Respiratory:       Pack a day  Cardiovascular: Negative.   Gastrointestinal: Positive for constipation.  Genitourinary: Negative.   Musculoskeletal: Positive for joint pain.       Bursitis  Skin: Negative.   Neurological: Negative.   Endo/Heme/Allergies: Negative.   Psychiatric/Behavioral: Positive for depression, hallucinations and substance abuse. The patient is nervous/anxious and has insomnia.     Blood pressure 131/78, pulse 64, temperature 97.7 F (36.5 C), temperature source Oral, resp. rate 20, height 5' 5"  (1.651 m), weight 79.379 kg (175 lb), SpO2 100 %.Body mass index is 29.12 kg/(m^2).   General Appearance: Fairly Groomed  Engineer, water::  Fair  Speech:  Clear and Coherent  Volume:  Normal  Mood:  Anxious, Dysphoric and worried  Affect:  anxious worried  Thought Process:  Coherent and Goal Directed  Orientation:  Full (Time, Place, and Person)  Thought Content:  symptoms events worries concerns  Suicidal Thoughts:  No  Homicidal Thoughts:  No  Memory:  Immediate;   Fair Recent;   Fair Remote;   Fair  Judgement:  Fair  Insight:  Present  Psychomotor Activity:  Restlessness  Concentration:  Fair  Recall:  [employer] of Knowledge:Fair  Language: Fair  Akathisia:  No  Handed:  Right  AIMS (if indicated):     Assets:  Desire for Improvement  ADL's:  Intact  Cognition: WNL  Sleep:  Number of Hours: 4   Treatment Plan Summary: Daily contact with patient to assess and evaluate symptoms and progress in treatment and Medication management Supportive approach/coping skills Substance abuse; continue to work a relapse prevention plan Depression; will continue the Prozac 40 mg daily Mood instability; will resume her Seroquel (EKG is WNL) will start 100 mg with an increase to 150 mg if necessary Will work with CBT/mindfulness: point out that the outcome might not be as bad as she expects. She is doing catastrophic thinking   LUGO,IRVING A 11/02/2015, 11:23 AM

## 2015-11-02 NOTE — Progress Notes (Signed)
[name] is mainly happy today...excited that she wil get her seroquel ( for sleep ) back tonight ( ekg done earleir and shown to Dr. Sabra Heck -per poc.  A She completed her daily assessment and on it she wrote  She deneid SI today and  she rated her deprssion, hopelessness and anxiety " 1/1/1", respectively. R Safety iin place

## 2015-11-02 NOTE — BHH Group Notes (Signed)
[place] LCSW Group Therapy 11/02/2015 1:15 PM Type of Therapy: Group Therapy Participation Level: Active  Participation Quality: Attentive, Sharing and Supportive  Affect: Appropriate  Cognitive: Alert and Oriented  Insight: Developing/Improving and Engaged  Engagement in Therapy: Developing/Improving and Engaged  Modes of Intervention: Clarification, Confrontation, Discussion, Education, Exploration, Limit-setting, Orientation, Problem-solving, Rapport Building, Art therapist, Socialization and Support  Summary of Progress/Problems: The topic for today was feelings about relapse. Pt discussed what relapse prevention is to them and identified triggers that they are on the path to relapse. Pt processed their feeling towards relapse and was able to relate to peers. Pt discussed coping skills that can be used for relapse prevention. Patient identified using drugs, specifically crack, as her relapse behavior. She discussed various triggers such as smells and seeing her drug dealer's car. She discussed knowing which people in her life she can be honest about her illness with and who she feels that she will be judged by. She reports that she is more successful in her recovery when she can be honest with herself and others. CSW and other group members provided patient with emotional support and encouragement.   Tilden Fossa, MSW, [place] Worker [facility] [phone]

## 2015-11-02 NOTE — BHH Group Notes (Addendum)
   [facility] LCSW Aftercare Discharge Planning Group Note  11/02/2015  8:45 AM   Participation Quality: Alert, Appropriate and Oriented  Mood/Affect: Appropriate  Depression Rating: 1  Anxiety Rating: 1  Thoughts of Suicide: Pt denies SI/HI  Will you contract for safety? Yes  Current AVH: Pt denies  Plan for Discharge/Comments: Pt attended discharge planning group and actively participated in group. CSW provided pt with today's workbook. Patient reports hoping to discharge home today to follow up with outpatient services at [facility].   Transportation Means: Pt reports access to transportation  Supports: No supports mentioned at this time  Tilden Fossa, MSW, [place] Social Worker [employer] [phone]

## 2015-11-02 NOTE — Plan of Care (Signed)
Problem: Alteration in mood & ability to function due to Goal: STG-Patient will attend groups Outcome: Progressing Patient attended [place] meeting on 11/02/2015

## 2015-11-02 NOTE — Progress Notes (Signed)
Pt reports she is doing better.  She is feeling more hopeful, but is a little apprehensive about the new sleep medication that she is to start tonight.  Spoke with pt about the medication, and she voices understanding.  She denies SI/HI/AVH at this time.  She plans to return home at discharge.  She says she has some legal issues she needs to take care of after the first of the year.  Support and encouragement offered.  Pt makes her needs known to staff.  Safety maintained with q15 minute checks.

## 2015-11-02 NOTE — Progress Notes (Signed)
Patient did attend the evening karaoke group. Pt was engaged and supportive but did not participate by singing a song.

## 2015-11-02 NOTE — Progress Notes (Signed)
Pt attended AA group this evening.

## 2015-11-02 NOTE — Progress Notes (Signed)
Writer spoke with patient 1:1 after she attended [employer]. She reports that she will be discharging on Sunday and was concerned that she would not be getting any seroquel tonight. Once she was informed of the order for seroquel she was pleased and ready to take her medications. She spoke about her jail time she may be looking at once discharged. She reports that this time she wanted to get clean for herself, not for her mother or children but for herself. Support given and safety maintained on unit with 15 min checks.

## 2015-11-02 NOTE — Plan of Care (Signed)
Problem: Alteration in mood & ability to function due to Goal: LTG-Pt reports reduction in suicidal thoughts (Patient reports reduction in suicidal thoughts and is able to verbalize a safety plan for whenever patient is feeling suicidal)  Outcome: Progressing Patient currently denies suicidal ideations.

## 2015-11-03 NOTE — Progress Notes (Signed)
[facility] MD Progress Note  11/03/2015 1:27 PM Michele Webster  MRN:  [id number] Subjective:  Pt states: "I'm doing really well. Dr. Sabra Heck said I could probably go on Sunday if things are going well."  Objective: Pt seen and chart reviewed. Pt is alert/oriented x4, calm, cooperative, and appropriate to situation. Pt denies suicidal/homicidal ideation and psychosis and does not appear to be responding to internal stimuli. Pt may be minimizing given that she overdosed on Seroquel. Pt reports that she wants to go home on Sunday, but this may not be a realistic goal given that this is her second assessment.   Principal Problem: Bipolar disorder with current episode depressed ([place]) Diagnosis:   Patient Active Problem List   Diagnosis Date Noted  . Cocaine use disorder, moderate, dependence ([place]) [F14.20] 11/01/2015  . Cannabis use disorder, moderate, dependence ([place]) [F12.20] 11/01/2015  . Bipolar disorder with current episode depressed ([place]) [F31.30] 10/31/2015  . Suicide attempt ([place]) [T14.91]   . Cocaine abuse [F14.10] 01/24/2014  . Polysubstance abuse [F19.10] 01/24/2014  . Depression [F32.9] 01/24/2014  . Anal fissure [K60.2] 02/05/2012   Total Time spent with patient: 15 minutes  Past Psychiatric History: See H&P  Past Medical History:  Past Medical History  Diagnosis Date  . Anxiety   . Substance abuse   . Depression   . High cholesterol   . Bipolar 1 disorder [facility])     Past Surgical History  Procedure Laterality Date  . Tubal ligation     Family History:  Family History  Problem Relation Age of Onset  . Cancer Maternal Grandmother     breast   Family Psychiatric  History: See H&P Social History:  History  Alcohol Use  . Yes     History  Drug Use  . 7.00 per week  . Special: Marijuana, Cocaine    Social History   Social History  . Marital Status: Legally Separated    Spouse Name: N/A  . Number of Children: N/A  . Years of Education: N/A   Social History  Main Topics  . Smoking status: Current Every Day Smoker    Types: Cigarettes  . Smokeless tobacco: None  . Alcohol Use: Yes  . Drug Use: 7.00 per week    Special: Marijuana, Cocaine  . Sexual Activity: No   Other Topics Concern  . None   Social History Narrative   ** Merged History Encounter **       Additional Social History:    Prescriptions: See PTA list History of alcohol / drug use?: Yes Longest period of sobriety (when/how long): 6 years Withdrawal Symptoms: Other (Comment) (none) Name of Substance 1: Cocaine 1 - Age of First Use: 20s 1 - Amount (size/oz): $100 1 - Frequency: day 1 - Duration: 12 yrs 1 - Last Use / Amount: today Name of Substance 2: Marijuana 2 - Age of First Use: 10 2 - Amount (size/oz): a dime bag 2 - Frequency: daily 2 - Duration: everyday since 39 yo 2 - Last Use / Amount: today Name of Substance 3: Alcohol 3 - Age of First Use: teens 3 - Amount (size/oz): 2 beers 3 - Frequency: day 3 - Duration: started back in 2016 (no drinking from 2012-2016)              Sleep: Good  Appetite:  Good  Current Medications: Current Facility-Administered Medications  Medication Dose Route Frequency Provider Last Rate Last Dose  . acetaminophen (TYLENOL) tablet 650 mg  650 mg  Oral Q4H PRN Delfin Gant, NP   650 mg at 11/01/15 0759  . FLUoxetine (PROZAC) capsule 40 mg  40 mg Oral Daily Nicholaus Bloom, MD   40 mg at 11/03/15 0803  . hydrOXYzine (ATARAX/VISTARIL) tablet 25 mg  25 mg Oral TID PRN Ursula Alert, MD      . hydrOXYzine (ATARAX/VISTARIL) tablet 50 mg  50 mg Oral QHS PRN Ursula Alert, MD      . ibuprofen (ADVIL,MOTRIN) tablet 800 mg  800 mg Oral Q8H PRN Encarnacion Slates, NP   800 mg at 11/03/15 0809  . lidocaine (LIDODERM) 5 % 1 patch  1 patch Transdermal Q24H Ursula Alert, MD   1 patch at 11/03/15 0802  . LORazepam (ATIVAN) tablet 1 mg  1 mg Oral Q8H PRN Delfin Gant, NP      . ondansetron (ZOFRAN) tablet 4 mg  4 mg Oral Q8H  PRN Delfin Gant, NP      . potassium chloride SA (K-DUR,KLOR-CON) CR tablet 20 mEq  20 mEq Oral BID Encarnacion Slates, NP   20 mEq at 11/03/15 0802  . QUEtiapine (SEROQUEL) tablet 100 mg  100 mg Oral QHS Harriet Butte, NP   100 mg at 11/02/15 2153  . ramelteon (ROZEREM) tablet 8 mg  8 mg Oral QHS Ursula Alert, MD   8 mg at 11/02/15 2153    Lab Results:  Results for orders placed or performed during the hospital encounter of 10/31/15 (from the past 48 hour(s))  TSH     Status: None   Collection Time: 11/02/15  6:34 AM  Result Value Ref Range   TSH 2.044 0.350 - 4.500 uIU/mL    Comment: Performed at [facility]    Physical Findings: AIMS: Facial and Oral Movements Muscles of Facial Expression: None, normal Lips and Perioral Area: None, normal Jaw: None, normal Tongue: None, normal,Extremity Movements Upper (arms, wrists, hands, fingers): None, normal Lower (legs, knees, ankles, toes): None, normal, Trunk Movements Neck, shoulders, hips: None, normal, Overall Severity Severity of abnormal movements (highest score from questions above): None, normal Incapacitation due to abnormal movements: None, normal Patient's awareness of abnormal movements (rate only patient's report): No Awareness, Dental Status Current problems with teeth and/or dentures?: No Does patient usually wear dentures?: No  CIWA:  CIWA-Ar Total: 1 COWS:  COWS Total Score: 0  Musculoskeletal: Strength & Muscle Tone: within normal limits Gait & Station: normal Patient leans: N/A  Psychiatric Specialty Exam: Review of Systems  Psychiatric/Behavioral: Positive for depression. Negative for suicidal ideas and hallucinations. The patient is nervous/anxious and has insomnia.   All other systems reviewed and are negative.   Blood pressure 134/79, pulse 76, temperature 97.8 F (36.6 C), temperature source Oral, resp. rate 20, height 5' 5"  (1.651 m), weight 79.379 kg (175 lb), SpO2 100 %.Body mass  index is 29.12 kg/(m^2).  General Appearance: Casual and Fairly Groomed  Engineer, water::  Good  Speech:  Clear and Coherent and Normal Rate  Volume:  Normal  Mood:  Euphoric  Affect:  Appropriate and Congruent  Thought Process:  Coherent, Goal Directed, Linear and Logical  Orientation:  Full (Time, Place, and Person)  Thought Content:  WDL  Suicidal Thoughts:  No  Homicidal Thoughts:  No  Memory:  Immediate;   Fair Recent;   Fair Remote;   Fair  Judgement:  Fair  Insight:  Fair  Psychomotor Activity:  Normal  Concentration:  Fair  Recall:  Fair  Fund of Knowledge:Fair  Language: Fair  Akathisia:  No  Handed:    AIMS (if indicated):     Assets:  Communication Skills Desire for Improvement Resilience Social Support  ADL's:  Intact  Cognition: WNL  Sleep:  Number of Hours: 5.75   Treatment Plan Summary: Daily contact with patient to assess and evaluate symptoms and progress in treatment and Medication management  Medications:  -Start Seroquel 183m qhs for auditory hallucinations and racing thoughts -Continue Rozerem 828mqhs insomnia -Continue prozac 402maily for MDD -Continue Vistaril 76m65md prn anxiety and 50mg30m prn insomnia adjuvant -Continue Lidoderm patch to lower back for chronic pain -Continue Ativan 1mg q61mprn anxiety  WithroBenjamine MolaBC 11/03/2015, 11:00AM

## 2015-11-03 NOTE — Progress Notes (Signed)
Michele Webster is more comfortable and calm today and this is evidenced by her not talking as loudly as she was, not demanding anything and allowing this staff nurse to process with her . She completed her daily assessment first thing this morning and on it she wrote she deneid SI and she rated her depression, hopelessness and anxiety " 0/0/0", respectively   A. She is attentive in her Life SKills groups and is beginning to process and undertand what her emotional instability is from ...  what she can do about it. R Safety is in place.

## 2015-11-03 NOTE — BHH Group Notes (Signed)
[place] Group Notes:  (Clinical Social Work)   09/29/2015     10:00-11:00AM  Summary of Progress/Problems:   In today's process group a decisional balance exercise was used to explore in depth the perceived benefits and costs of alcohol and drugs, as well as the  benefits and costs of replacing these with healthy coping skills.  Patients listed healthy and unhealthy coping techniques, determining with CSW guidance that unhealthy coping techniques work initially, but eventually become harmful.  Motivational Interviewing and the whiteboard were utilized for the exercises.  The patient expressed that the unhealthy coping she often uses is drinking/drugs and she is in the hospital for depression, suicidal ideation and detox.  She participated fully and well in group, was very insightful about her own motivations.  She stated she has made the decision she is going to stop using, and is in the Planning stage of change.  Type of Therapy:  Group Therapy - Process   Participation Level:  Active  Participation Quality:  Attentive and Sharing  Affect:  Blunted  Cognitive:  Alert and Appropriate  Insight:  Engaged  Engagement in Therapy:  Engaged  Modes of Intervention:  Education, Motivational Interviewing  Selmer Dominion, LCSW 11/03/2015, 11:56 AM

## 2015-11-03 NOTE — BHH Group Notes (Signed)
[place] Group Notes:  (Nursing/MHT/Case Management/Adjunct)  Date:  11/03/2015  Time:  1315  Type of Therapy:  Nurse Education /  Life Skills : The group is focused on teaching patients how to identify their needs and then how to develop healthy coping skills..to get thei needs met.  Participation Level:  Good  Participation Quality:Good    Affect:  Animated  Cognitive:  Inact  Insight: Improving  Engagement in Group:  Engaged  Modes of Intervention:  Discussion  Summary of Progress/Problems:  Lauralyn Primes 11/03/2015, 4:47 PM

## 2015-11-04 DIAGNOSIS — F314 Bipolar disorder, current episode depressed, severe, without psychotic features: Secondary | ICD-10-CM

## 2015-11-04 MED ORDER — HYDROXYZINE HCL 25 MG PO TABS: 25.0000 mg | ORAL_TABLET | Freq: Three times a day (TID) | ORAL | Status: DC | PRN

## 2015-11-04 MED ORDER — FLUOXETINE HCL 40 MG PO CAPS: 40.0000 mg | ORAL_CAPSULE | Freq: Every day | ORAL | Status: DC

## 2015-11-04 MED ORDER — LIDOCAINE 5 % EX PTCH: 1.0000 | MEDICATED_PATCH | CUTANEOUS | Status: DC

## 2015-11-04 MED ORDER — QUETIAPINE FUMARATE 100 MG PO TABS: 100.0000 mg | ORAL_TABLET | Freq: Every day | ORAL | Status: DC

## 2015-11-04 MED ORDER — RAMELTEON 8 MG PO TABS: 8.0000 mg | ORAL_TABLET | Freq: Every day | ORAL | Status: DC

## 2015-11-04 NOTE — Progress Notes (Signed)
  [facility] Adult Case Management Discharge Plan :  Will you be returning to the same living situation after discharge:  Yes,  with mother At discharge, do you have transportation home?: Yes,  mother to pick up Do you have the ability to pay for your medications: Yes,  insurance  Release of information consent forms completed and in the chart;  Patient's signature needed at discharge.  Patient to Follow up at: Follow-up Information    Follow up with [facility]. Go on 11/16/2015.   Specialty:  Behavioral Health   Why:  Follow up appt at [facility] on 11/16/15 at 8:25 am for medication management with [facility]  Please call to reschedule if you can not make your appointment.   Contact information:   [place] [address] [phone]       Next level of care provider has access to [place]  Patient denies SI/HI: Yes,  see doctor's SRA    Safety Planning and Suicide Prevention discussed: Yes,  done per CSW handoff  Have you used any form of tobacco in the last 30 days? (Cigarettes, Smokeless Tobacco, Cigars, and/or Pipes): Yes  Has patient been referred to the Quitline?: Patient refused referral  Lysle Dingwall 11/04/2015, 2:34 PM

## 2015-11-04 NOTE — BHH Group Notes (Signed)
[place] Group Notes: (Clinical Social Work)   11/04/2015      Type of Therapy:  Group Therapy   Participation Level:  Did Not Attend despite MHT prompting   Selmer Dominion, LCSW 11/04/2015, 11:50 AM

## 2015-11-04 NOTE — Progress Notes (Signed)
The patient attended last evening's A.A. Meeting.
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
## 2015-11-04 NOTE — Progress Notes (Signed)
Webster Webster says " Hi..Internal Medicine so glad Im going home today!!! I can't wait until the doctor gets here". She is loud. She takes her scheduled meds as planned. She completed her daily assessment and on it she wrote she denied SI today and she rated her depresison, hopelessness and anxiety " 0/0/0", respectively.    A NP to discuss DC plan with pt later this AM.    R Safety in place and stafff supportive.

## 2015-11-04 NOTE — Progress Notes (Signed)
Patient has been up and active on the unit, attended AA group this evening. Patient looking forward to possibly discharging on tomorrow,  She is -si/hi/a/v hall. Support and encouragement offered, safety maintained on unit, will continue to monitor.

## 2015-11-04 NOTE — Progress Notes (Signed)
MD completed pt's DC SRA and DC order. Pt completed her daily assessment and on it she wrote she denied SI today and she rated her depression, hopelessness and anxiety " 0/0/0", respectively. She is given written prescriptions from her MD and her DC AVS is reviewed with her. She states understanding and willingness to comply. All belongings are returned to her and she is escorted to bldg entrance and dc'd per MD order.

## 2015-11-04 NOTE — Discharge Summary (Signed)
Physician Discharge Summary Note  Patient:  Michele Webster is an 39 y.o., female MRN:  [id number] DOB:  [birth year] Patient phone:  [phone] (home)  Patient address:   [address] [place] [address],  Total Time spent with patient: 45 minutes  Date of Admission:  10/31/2015 Date of Discharge: 11/04/2015  Reason for Admission:   History of Present Illness: Michele Webster is a 39 year old Caucasian female. Admitted to the [facility] adult unit with complaints of suicide attempt by overdose. She reports during this assessment; "I was taken to the [facility] ED yesterday. I had wanted to go to sleep & not wake up. So, I took a bunch of my Seroquel pills. They are150 mg each. I have been on this medication for 1 year for Bipolar disorder & Insomnia. I did this because I feel overwhelmed. There are a lot of shit going on in my life. I have been severely depressed since September after spending 3 weeks in jail. I got out of jail on the 28th of September, 2016. I'm currently looking at going back to prison for 2 & 1/2 years for; larceny on an employer, conspiracy in [place] abetting a minor & possession of drugs & drug paraphernalia. One of the charges has been moved up to the superior court. My lawyer is very concerned. I receive my mental health treatment at [facility]. I hear voices, but the voices are now softer. My mood is improving, will even improve more if I can stay off of drugs. I smoke weed & use cocaine"   Principal Problem: Bipolar disorder with current episode depressed [facility]) Discharge Diagnoses: Patient Active Problem List   Diagnosis Date Noted  . Cocaine use disorder, moderate, dependence ([place]) [F14.20] 11/01/2015  . Cannabis use disorder, moderate, dependence ([place]) [F12.20] 11/01/2015  . Bipolar disorder with current episode depressed ([place]) [F31.30] 10/31/2015  . Suicide attempt ([place]) [T14.91]   . Cocaine abuse [F14.10] 01/24/2014  . Polysubstance abuse [F19.10] 01/24/2014   . Depression [F32.9] 01/24/2014  . Anal fissure [K60.2] 02/05/2012    Past Psychiatric History: See H&P  Past Medical History:  Past Medical History  Diagnosis Date  . Anxiety   . Substance abuse   . Depression   . High cholesterol   . Bipolar 1 disorder [facility])     Past Surgical History  Procedure Laterality Date  . Tubal ligation     Family History:  Family History  Problem Relation Age of Onset  . Cancer Maternal Grandmother     breast   Family Psychiatric  History: See H&P Social History:  History  Alcohol Use  . Yes     History  Drug Use  . 7.00 per week  . Special: Marijuana, Cocaine    Social History   Social History  . Marital Status: Legally Separated    Spouse Name: N/A  . Number of Children: N/A  . Years of Education: N/A   Social History Main Topics  . Smoking status: Current Every Day Smoker    Types: Cigarettes  . Smokeless tobacco: None  . Alcohol Use: Yes  . Drug Use: 7.00 per week    Special: Marijuana, Cocaine  . Sexual Activity: No   Other Topics Concern  . None   Social History Narrative   ** Merged History Encounter **        Hospital Course:   Michele Webster was admitted for Bipolar disorder with current episode depressed ([place]) , and crisis management.  Pt was treated discharged with the medications listed below under Medication List.  Medical problems were identified and treated as needed.  Home medications were restarted as appropriate.  Improvement was monitored by observation and Michele Webster 's daily report of symptom reduction.  Emotional and mental status was monitored by daily self-inventory reports completed by Michele Webster and clinical staff.         Michele Webster was evaluated by the treatment team for stability and plans for continued recovery upon discharge. Michele Webster 's motivation was an integral factor for scheduling further treatment. Employment, transportation, bed availability,  health status, family support, and any pending legal issues were also considered during hospital stay. Pt was offered further treatment options upon discharge including but not limited to Residential, Intensive Outpatient, and Outpatient treatment.  Michele Webster will follow up with the services as listed below under Follow Up Information.     Upon completion of this admission the patient was both mentally and medically stable for discharge denying suicidal/homicidal ideation, auditory/visual/tactile hallucinations, delusional thoughts and paranoia.    Michele Webster responded well to treatment with Prozac, Vistaril, Lidoderm, Ativan, Seroquel, and Rozerem without adverse effects. Initially, pt did complain of insomnia with these medications, but this resolved. Pt demonstrated improvement without reported or observed adverse effects to the point of stability appropriate for outpatient management. Pertinent labs include: UDS + cocaine/THC , Potassium 3.3 (treated, asymptomatic, EKG is WNL. Reviewed CBC, CMP, BAL, and UDS; all unremarkable aside from noted exceptions.   Physical Findings: AIMS: Facial and Oral Movements Muscles of Facial Expression: None, normal Lips and Perioral Area: None, normal Jaw: None, normal Tongue: None, normal,Extremity Movements Upper (arms, wrists, hands, fingers): None, normal Lower (legs, knees, ankles, toes): None, normal, Trunk Movements Neck, shoulders, hips: None, normal, Overall Severity Severity of abnormal movements (highest score from questions above): None, normal Incapacitation due to abnormal movements: None, normal Patient's awareness of abnormal movements (rate only patient's report): No Awareness, Dental Status Current problems with teeth and/or dentures?: No Does patient usually wear dentures?: No  CIWA:  CIWA-Ar Total: 1 COWS:  COWS Total Score: 0  Musculoskeletal: Strength & Muscle Tone: within normal limits Gait & Station:  normal Patient leans: N/A  Psychiatric Specialty Exam: Review of Systems  Psychiatric/Behavioral: Positive for depression and substance abuse. Negative for suicidal ideas and hallucinations. The patient is nervous/anxious and has insomnia.   All other systems reviewed and are negative.   Blood pressure 109/77, pulse 80, temperature 98.3 F (36.8 C), temperature source Oral, resp. rate 16, height 5' 5"  (1.651 m), weight 79.379 kg (175 lb), SpO2 100 %.Body mass index is 29.12 kg/(m^2).  SEE MD PSE within the SRA   Have you used any form of tobacco in the last 30 days? (Cigarettes, Smokeless Tobacco, Cigars, and/or Pipes): Yes  Has this patient used any form of tobacco in the last 30 days? (Cigarettes, Smokeless Tobacco, Cigars, and/or Pipes) Yes, No  Metabolic Disorder Labs:  No results found for: HGBA1C, MPG No results found for: PROLACTIN No results found for: CHOL, TRIG, HDL, CHOLHDL, VLDL, LDLCALC  See Psychiatric Specialty Exam and Suicide Risk Assessment completed by Attending Physician prior to discharge.  Discharge destination:  Home  Is patient on multiple antipsychotic therapies at discharge:  No   Has Patient had three or more failed trials of antipsychotic monotherapy by history:  No  Recommended Plan for Multiple Antipsychotic Therapies: NA     Medication List  STOP taking these medications        ibuprofen 200 MG tablet  Commonly known as:  ADVIL,MOTRIN      TAKE these medications      Indication   FLUoxetine 40 MG capsule  Commonly known as:  PROZAC  Take 1 capsule (40 mg total) by mouth daily.   Indication:  Manic-Depression, Major Depressive Disorder     hydrOXYzine 25 MG tablet  Commonly known as:  ATARAX/VISTARIL  Take 1 tablet (25 mg total) by mouth 3 (three) times daily as needed for anxiety.   Indication:  Anxiety Neurosis     lidocaine 5 %  Commonly known as:  LIDODERM  Place 1 patch onto the skin daily. Remove & Discard patch within 12  hours or as directed by MD   Indication:  back pain     QUEtiapine 100 MG tablet  Commonly known as:  SEROQUEL  Take 1 tablet (100 mg total) by mouth at bedtime.   Indication:  racing thoughts at night     ramelteon 8 MG tablet  Commonly known as:  ROZEREM  Take 1 tablet (8 mg total) by mouth at bedtime.   Indication:  Trouble Sleeping           Follow-up Information    Follow up with [facility]. Go on 11/16/2015.   Specialty:  Behavioral Health   Why:  Follow up appt at [facility] on 11/16/15 at 8:25 am for medication management with [facility].  Please call to reschedule if you can not make your appointment.   Contact information:   [place] [address] [phone]       Follow-up recommendations:  Activity:  As tolerated Diet:  Heart healthy with low sodium.  Comments:   Take all medications as prescribed. Keep all follow-up appointments as scheduled.  Do not consume alcohol or use illegal drugs while on prescription medications. Report any adverse effects from your medications to your primary care provider promptly.  In the event of recurrent symptoms or worsening symptoms, call 911, a crisis hotline, or go to the nearest emergency department for evaluation.   Signed: Benjamine Mola, FNP-BC 11/04/2015, 1:31 PM

## 2015-11-04 NOTE — Progress Notes (Signed)
Requested by NP JW to look into patient's chart, regarding what physician has documented in terms of patient's discharge date. Unable to find this data and with JW , tis nurse left voicemail message on physician's phone, requesting he call adult unit.

## 2015-12-18 ENCOUNTER — Other Ambulatory Visit (HOSPITAL_COMMUNITY): Payer: Self-pay | Admitting: Psychiatry

## 2015-12-19 ENCOUNTER — Other Ambulatory Visit (HOSPITAL_COMMUNITY): Payer: Self-pay | Admitting: Psychiatry

## 2015-12-28 ENCOUNTER — Other Ambulatory Visit (HOSPITAL_COMMUNITY): Payer: Self-pay | Admitting: Psychiatry

## 2015-12-29 ENCOUNTER — Other Ambulatory Visit (HOSPITAL_COMMUNITY): Payer: Self-pay | Admitting: Psychiatry

## 2016-04-14 ENCOUNTER — Other Ambulatory Visit: Payer: Self-pay

## 2016-04-14 DIAGNOSIS — Z1231 Encounter for screening mammogram for malignant neoplasm of breast: Secondary | ICD-10-CM

## 2016-04-15 ENCOUNTER — Ambulatory Visit: Payer: Self-pay

## 2016-12-31 ENCOUNTER — Emergency Department (HOSPITAL_BASED_OUTPATIENT_CLINIC_OR_DEPARTMENT_OTHER)
Admission: EM | Admit: 2016-12-31 | Discharge: 2016-12-31 | Disposition: A | Discharge status: Home / Self Care | Payer: Medicaid Other | Attending: Emergency Medicine | Admitting: Emergency Medicine

## 2016-12-31 ENCOUNTER — Encounter (HOSPITAL_BASED_OUTPATIENT_CLINIC_OR_DEPARTMENT_OTHER): Payer: Self-pay | Admitting: Emergency Medicine

## 2016-12-31 DIAGNOSIS — F17210 Nicotine dependence, cigarettes, uncomplicated: Secondary | ICD-10-CM | POA: Diagnosis not present

## 2016-12-31 DIAGNOSIS — A5901 Trichomonal vulvovaginitis: Secondary | ICD-10-CM | POA: Diagnosis not present

## 2016-12-31 DIAGNOSIS — N898 Other specified noninflammatory disorders of vagina: Secondary | ICD-10-CM | POA: Diagnosis present

## 2016-12-31 LAB — URINALYSIS, ROUTINE W REFLEX MICROSCOPIC
Bilirubin Urine: NEGATIVE
Glucose, UA: NEGATIVE mg/dL
Ketones, ur: NEGATIVE mg/dL
Nitrite: NEGATIVE
Protein, ur: NEGATIVE mg/dL
Specific Gravity, Urine: 1.024 (ref 1.005–1.030)
pH: 5.5 (ref 5.0–8.0)

## 2016-12-31 LAB — URINALYSIS, MICROSCOPIC (REFLEX)

## 2016-12-31 LAB — PREGNANCY, URINE: Preg Test, Ur: NEGATIVE

## 2016-12-31 LAB — WET PREP, GENITAL
Sperm: NONE SEEN
Yeast Wet Prep HPF POC: NONE SEEN

## 2016-12-31 MED ORDER — METRONIDAZOLE 500 MG PO TABS: 500.0000 mg | ORAL_TABLET | Freq: Two times a day (BID) | ORAL | 0 refills | Status: DC

## 2016-12-31 NOTE — ED Notes (Signed)
[facility] called for tranpsort

## 2016-12-31 NOTE — ED Triage Notes (Addendum)
Vaginal discharge - light yellow - with foul odor x3 days.  Denies itching or burning.  Pt from [facility]

## 2016-12-31 NOTE — ED Provider Notes (Signed)
[facility]PT [facility] Provider Note   CSN: [id number] Arrival date & time: 12/31/16  1548     History   Chief Complaint Chief Complaint  Patient presents with  . Vaginal Discharge    HPI Michele Webster is a 41 y.o. female.  HPI Patient presents from [facility] with vaginal odor. Has had it for the last 3 days. No itching burning or pain. Has had unprotected sex.no dysuria. She just started her menses today. She is in treatment for cocaine and alcohol use.    Past Medical History:  Diagnosis Date  . Anxiety   . Bipolar 1 disorder ([place])   . Depression   . High cholesterol   . Substance abuse     Patient Active Problem List   Diagnosis Date Noted  . Cocaine use disorder, moderate, dependence ([place]) 11/01/2015  . Cannabis use disorder, moderate, dependence ([place]) 11/01/2015  . Bipolar disorder with current episode depressed ([place]) 10/31/2015  . Suicide attempt   . Cocaine abuse 01/24/2014  . Polysubstance abuse 01/24/2014  . Depression 01/24/2014  . Anal fissure 02/05/2012    Past Surgical History:  Procedure Laterality Date  . TUBAL LIGATION      OB History    No data available       Home Medications    Prior to Admission medications   Medication Sig Start Date End Date Taking? Authorizing Provider  FLUoxetine (PROZAC) 40 MG capsule Take 1 capsule (40 mg total) by mouth daily. 11/04/15   Benjamine Mola, FNP  hydrOXYzine (ATARAX/VISTARIL) 25 MG tablet Take 1 tablet (25 mg total) by mouth 3 (three) times daily as needed for anxiety. 11/04/15   Benjamine Mola, FNP  lidocaine (LIDODERM) 5 % Place 1 patch onto the skin daily. Remove & Discard patch within 12 hours or as directed by MD 11/04/15   Benjamine Mola, FNP  metroNIDAZOLE (FLAGYL) 500 MG tablet Take 1 tablet (500 mg total) by mouth 2 (two) times daily. 12/31/16   Davonna Belling, MD  QUEtiapine (SEROQUEL) 100 MG tablet Take 1 tablet (100 mg total) by mouth at bedtime. 11/04/15   Benjamine Mola, FNP    ramelteon (ROZEREM) 8 MG tablet Take 1 tablet (8 mg total) by mouth at bedtime. 11/04/15   Benjamine Mola, FNP    Family History Family History  Problem Relation Age of Onset  . Cancer Maternal Grandmother     breast    Social History Social History  Substance Use Topics  . Smoking status: Current Every Day Smoker    Packs/day: 1.00    Types: Cigarettes  . Smokeless tobacco: Never Used  . Alcohol use No     Comment: In [facility] at this time for drug and alcohol abuse     Allergies   Ceclor [cefaclor]; Sertraline; and Ceclor [cefaclor]   Review of Systems Review of Systems  Constitutional: Negative for appetite change and fever.  HENT: Negative for congestion.   Respiratory: Negative for shortness of breath.   Cardiovascular: Negative for chest pain.  Gastrointestinal: Negative for abdominal pain.  Genitourinary: Positive for vaginal discharge. Negative for decreased urine volume, menstrual problem, pelvic pain and vaginal pain.  Musculoskeletal: Negative for back pain.  Skin: Negative for rash.  Neurological: Negative for weakness.  Hematological: Negative for adenopathy.  Psychiatric/Behavioral: Negative for confusion.     Physical Exam Updated Vital Signs BP 131/91 (BP Location: Right Arm)   Pulse 90   Temp 98.5 F (36.9 C) (Oral)  Resp 16   Ht 5' 5"  (1.651 m)   Wt 208 lb (94.3 kg)   LMP 12/31/2016 (Exact Date)   SpO2 99%   BMI 34.61 kg/m   Physical Exam  Constitutional: She appears well-developed.  HENT:  Head: Atraumatic.  Neck: Neck supple.  Cardiovascular: Normal rate.   Pulmonary/Chest: Effort normal.  Abdominal: There is no tenderness.  Genitourinary:  Genitourinary Comments: Mild amount of brownish vaginal discharge. No cervical motion tenderness. No adnexal tenderness.  Musculoskeletal: She exhibits no edema.  Neurological: She is alert.  Skin: Skin is warm.     ED Treatments / Results  Labs (all labs ordered are listed, but only  abnormal results are displayed) Labs Reviewed  WET PREP, GENITAL - Abnormal; Notable for the following:       Result Value   Trich, Wet Prep PRESENT (*)    Clue Cells Wet Prep HPF POC PRESENT (*)    WBC, Wet Prep HPF POC MANY (*)    All other components within normal limits  URINALYSIS, ROUTINE W REFLEX MICROSCOPIC - Abnormal; Notable for the following:    APPearance CLOUDY (*)    Hgb urine dipstick SMALL (*)    Leukocytes, UA MODERATE (*)    All other components within normal limits  URINALYSIS, MICROSCOPIC (REFLEX) - Abnormal; Notable for the following:    Bacteria, UA RARE (*)    Squamous Epithelial / LPF 0-5 (*)    All other components within normal limits  PREGNANCY, URINE  RPR  HIV ANTIBODY (ROUTINE TESTING)  GC/CHLAMYDIA PROBE AMP ([place]) NOT AT [facility]    EKG  EKG Interpretation None       Radiology No results found.  Procedures Procedures (including critical care time)  Medications Ordered in ED Medications - No data to display   Initial Impression / Assessment and Plan / ED Course  I have reviewed the triage vital signs and the nursing notes.  Pertinent labs & imaging results that were available during my care of the patient were reviewed by me and considered in my medical decision making (see chart for details).    Patient with vaginal discharge. Trichomoniasis on wet prep. Will not empirically treat for all STDs. Culture sent. Will discharge to [facility]. Final diagnoses:  Trichomonas vaginitis    New Prescriptions New Prescriptions   METRONIDAZOLE (FLAGYL) 500 MG TABLET    Take 1 tablet (500 mg total) by mouth 2 (two) times daily.     Davonna Belling, MD 12/31/16 [phone]

## 2017-01-01 LAB — GC/CHLAMYDIA PROBE AMP (~~LOC~~) NOT AT ARMC
Chlamydia: NEGATIVE
Neisseria Gonorrhea: NEGATIVE

## 2017-01-01 LAB — HIV ANTIBODY (ROUTINE TESTING W REFLEX): HIV Screen 4th Generation wRfx: NONREACTIVE

## 2017-01-01 LAB — RPR: RPR Ser Ql: NONREACTIVE

## 2017-12-31 ENCOUNTER — Encounter (HOSPITAL_COMMUNITY): Payer: Self-pay | Admitting: Emergency Medicine

## 2017-12-31 ENCOUNTER — Other Ambulatory Visit: Payer: Self-pay

## 2017-12-31 ENCOUNTER — Emergency Department (HOSPITAL_COMMUNITY): Payer: Medicaid Other

## 2017-12-31 ENCOUNTER — Encounter (HOSPITAL_COMMUNITY)
Admission: EM | Disposition: A | Discharge status: Home / Self Care | Payer: Self-pay | Source: Home / Self Care | Attending: Orthopedic Surgery

## 2017-12-31 ENCOUNTER — Inpatient Hospital Stay (HOSPITAL_COMMUNITY)
Admission: EM | Admit: 2017-12-31 | Discharge: 2018-01-02 | DRG: 494 | Disposition: A | Discharge status: Home / Self Care | Payer: Medicaid Other | Attending: Orthopedic Surgery | Admitting: Orthopedic Surgery

## 2017-12-31 ENCOUNTER — Emergency Department (HOSPITAL_COMMUNITY): Payer: Medicaid Other | Admitting: Anesthesiology

## 2017-12-31 DIAGNOSIS — F17210 Nicotine dependence, cigarettes, uncomplicated: Secondary | ICD-10-CM | POA: Diagnosis present

## 2017-12-31 DIAGNOSIS — S8254XA Nondisplaced fracture of medial malleolus of right tibia, initial encounter for closed fracture: Secondary | ICD-10-CM | POA: Diagnosis present

## 2017-12-31 DIAGNOSIS — Z23 Encounter for immunization: Secondary | ICD-10-CM | POA: Diagnosis not present

## 2017-12-31 DIAGNOSIS — S82201A Unspecified fracture of shaft of right tibia, initial encounter for closed fracture: Secondary | ICD-10-CM | POA: Diagnosis present

## 2017-12-31 DIAGNOSIS — Z79899 Other long term (current) drug therapy: Secondary | ICD-10-CM

## 2017-12-31 DIAGNOSIS — F419 Anxiety disorder, unspecified: Secondary | ICD-10-CM | POA: Diagnosis present

## 2017-12-31 DIAGNOSIS — Z888 Allergy status to other drugs, medicaments and biological substances status: Secondary | ICD-10-CM | POA: Diagnosis not present

## 2017-12-31 DIAGNOSIS — W19XXXA Unspecified fall, initial encounter: Secondary | ICD-10-CM

## 2017-12-31 DIAGNOSIS — F1010 Alcohol abuse, uncomplicated: Secondary | ICD-10-CM | POA: Diagnosis present

## 2017-12-31 DIAGNOSIS — S82251A Displaced comminuted fracture of shaft of right tibia, initial encounter for closed fracture: Secondary | ICD-10-CM | POA: Diagnosis present

## 2017-12-31 DIAGNOSIS — W010XXA Fall on same level from slipping, tripping and stumbling without subsequent striking against object, initial encounter: Secondary | ICD-10-CM | POA: Diagnosis present

## 2017-12-31 DIAGNOSIS — Z881 Allergy status to other antibiotic agents status: Secondary | ICD-10-CM

## 2017-12-31 DIAGNOSIS — Y92009 Unspecified place in unspecified non-institutional (private) residence as the place of occurrence of the external cause: Secondary | ICD-10-CM

## 2017-12-31 DIAGNOSIS — S82401A Unspecified fracture of shaft of right fibula, initial encounter for closed fracture: Secondary | ICD-10-CM

## 2017-12-31 DIAGNOSIS — F319 Bipolar disorder, unspecified: Secondary | ICD-10-CM | POA: Diagnosis present

## 2017-12-31 DIAGNOSIS — Z419 Encounter for procedure for purposes other than remedying health state, unspecified: Secondary | ICD-10-CM

## 2017-12-31 DIAGNOSIS — S8291XA Unspecified fracture of right lower leg, initial encounter for closed fracture: Secondary | ICD-10-CM

## 2017-12-31 DIAGNOSIS — F1910 Other psychoactive substance abuse, uncomplicated: Secondary | ICD-10-CM | POA: Diagnosis present

## 2017-12-31 DIAGNOSIS — S82451A Displaced comminuted fracture of shaft of right fibula, initial encounter for closed fracture: Secondary | ICD-10-CM | POA: Diagnosis present

## 2017-12-31 HISTORY — PX: TIBIA IM NAIL INSERTION: SHX2516

## 2017-12-31 LAB — CBC
HCT: 37.5 % (ref 36.0–46.0)
Hemoglobin: 12.5 g/dL (ref 12.0–15.0)
MCH: 29 pg (ref 26.0–34.0)
MCHC: 33.3 g/dL (ref 30.0–36.0)
MCV: 87 fL (ref 78.0–100.0)
Platelets: 344 10*3/uL (ref 150–400)
RBC: 4.31 MIL/uL (ref 3.87–5.11)
RDW: 14.7 % (ref 11.5–15.5)
WBC: 18.5 10*3/uL — ABNORMAL HIGH (ref 4.0–10.5)

## 2017-12-31 LAB — BASIC METABOLIC PANEL
Anion gap: 6 (ref 5–15)
BUN: 10 mg/dL (ref 6–20)
CO2: 24 mmol/L (ref 22–32)
Calcium: 8.4 mg/dL — ABNORMAL LOW (ref 8.9–10.3)
Chloride: 104 mmol/L (ref 101–111)
Creatinine, Ser: 0.85 mg/dL (ref 0.44–1.00)
GFR calc Af Amer: >60 mL/min (ref >60)
GFR calc non Af Amer: >60 mL/min (ref >60)
Glucose, Bld: 107 mg/dL — ABNORMAL HIGH (ref 65–99)
Potassium: 4.5 mmol/L (ref 3.5–5.1)
Sodium: 134 mmol/L — ABNORMAL LOW (ref 135–145)

## 2017-12-31 LAB — RAPID URINE DRUG SCREEN, HOSP PERFORMED
Amphetamines: NOT DETECTED
Barbiturates: NOT DETECTED
Benzodiazepines: NOT DETECTED
Cocaine: POSITIVE — AB
Opiates: POSITIVE — AB
Tetrahydrocannabinol: POSITIVE — AB

## 2017-12-31 SURGERY — INSERTION, INTRAMEDULLARY ROD, TIBIA
Anesthesia: General | Laterality: Right

## 2017-12-31 MED ORDER — KETOROLAC TROMETHAMINE 30 MG/ML IJ SOLN
INTRAMUSCULAR | Status: AC
  Filled 2017-12-31: qty 1

## 2017-12-31 MED ORDER — MIDAZOLAM HCL 2 MG/2ML IJ SOLN
INTRAMUSCULAR | Status: AC
  Filled 2017-12-31: qty 2

## 2017-12-31 MED ORDER — LIDOCAINE 2% (20 MG/ML) 5 ML SYRINGE
INTRAMUSCULAR | Status: DC | PRN
  Administered 2017-12-31: 100 mg via INTRAVENOUS

## 2017-12-31 MED ORDER — DEXAMETHASONE SODIUM PHOSPHATE 10 MG/ML IJ SOLN
INTRAMUSCULAR | Status: DC | PRN
  Administered 2017-12-31: 10 mg via INTRAVENOUS

## 2017-12-31 MED ORDER — ROCURONIUM BROMIDE 10 MG/ML (PF) SYRINGE
PREFILLED_SYRINGE | INTRAVENOUS | Status: AC
  Filled 2017-12-31: qty 5

## 2017-12-31 MED ORDER — PNEUMOCOCCAL VAC POLYVALENT 25 MCG/0.5ML IJ INJ
0.5000 mL | INJECTION | INTRAMUSCULAR | Status: AC
  Administered 2018-01-02: 0.5 mL via INTRAMUSCULAR
  Filled 2017-12-31: qty 0.5

## 2017-12-31 MED ORDER — SODIUM CHLORIDE 0.9 % IJ SOLN
INTRAMUSCULAR | Status: AC
  Filled 2017-12-31: qty 10

## 2017-12-31 MED ORDER — PROPOFOL 10 MG/ML IV BOLUS
INTRAVENOUS | Status: DC | PRN
  Administered 2017-12-31: 170 mg via INTRAVENOUS

## 2017-12-31 MED ORDER — ACETAMINOPHEN 500 MG PO TABS
1000.0000 mg | ORAL_TABLET | Freq: Four times a day (QID) | ORAL | Status: AC
  Administered 2018-01-01 (×4): 1000 mg via ORAL
  Filled 2017-12-31 (×4): qty 2

## 2017-12-31 MED ORDER — KETAMINE HCL 10 MG/ML IJ SOLN
INTRAMUSCULAR | Status: DC | PRN
  Administered 2017-12-31: 30 mg via INTRAVENOUS
  Administered 2017-12-31: 70 mg via INTRAVENOUS

## 2017-12-31 MED ORDER — FENTANYL CITRATE (PF) 250 MCG/5ML IJ SOLN
INTRAMUSCULAR | Status: AC
  Filled 2017-12-31: qty 5

## 2017-12-31 MED ORDER — HYDROMORPHONE HCL 2 MG/ML IJ SOLN
INTRAMUSCULAR | Status: AC
  Filled 2017-12-31: qty 1

## 2017-12-31 MED ORDER — POVIDONE-IODINE 10 % EX SWAB
2.0000 "application " | Freq: Once | CUTANEOUS | Status: AC
  Administered 2017-12-31: 2 via TOPICAL

## 2017-12-31 MED ORDER — METHOCARBAMOL 500 MG PO TABS
500.0000 mg | ORAL_TABLET | Freq: Four times a day (QID) | ORAL | Status: DC | PRN
  Administered 2017-12-31 – 2018-01-02 (×4): 500 mg via ORAL
  Filled 2017-12-31 (×4): qty 1

## 2017-12-31 MED ORDER — KETOROLAC TROMETHAMINE 30 MG/ML IJ SOLN
INTRAMUSCULAR | Status: DC | PRN
  Administered 2017-12-31: 30 mg via INTRAVENOUS

## 2017-12-31 MED ORDER — LORAZEPAM BOLUS VIA INFUSION: 1.0000 mg | Freq: Three times a day (TID) | INTRAVENOUS | Status: DC | PRN

## 2017-12-31 MED ORDER — HYDROXYZINE HCL 25 MG PO TABS: 25.0000 mg | ORAL_TABLET | Freq: Three times a day (TID) | ORAL | Status: DC | PRN

## 2017-12-31 MED ORDER — ACETAMINOPHEN 10 MG/ML IV SOLN
INTRAVENOUS | Status: AC
  Filled 2017-12-31: qty 100

## 2017-12-31 MED ORDER — OXYCODONE-ACETAMINOPHEN 5-325 MG PO TABS: 1.0000 | ORAL_TABLET | Freq: Four times a day (QID) | ORAL | 0 refills | Status: DC | PRN

## 2017-12-31 MED ORDER — DEXAMETHASONE SODIUM PHOSPHATE 10 MG/ML IJ SOLN
INTRAMUSCULAR | Status: AC
  Filled 2017-12-31: qty 1

## 2017-12-31 MED ORDER — METHOCARBAMOL 1000 MG/10ML IJ SOLN
500.0000 mg | Freq: Four times a day (QID) | INTRAMUSCULAR | Status: DC | PRN
  Filled 2017-12-31: qty 5

## 2017-12-31 MED ORDER — POLYETHYLENE GLYCOL 3350 17 G PO PACK: 17.0000 g | PACK | Freq: Every day | ORAL | Status: DC | PRN

## 2017-12-31 MED ORDER — ASPIRIN EC 325 MG PO TBEC: 325.0000 mg | DELAYED_RELEASE_TABLET | Freq: Every day | ORAL | 0 refills | Status: DC

## 2017-12-31 MED ORDER — LACTATED RINGERS IV SOLN
INTRAVENOUS | Status: DC
  Administered 2017-12-31 (×2): via INTRAVENOUS

## 2017-12-31 MED ORDER — KETAMINE HCL 10 MG/ML IJ SOLN
INTRAMUSCULAR | Status: AC
  Filled 2017-12-31: qty 1

## 2017-12-31 MED ORDER — LABETALOL HCL 5 MG/ML IV SOLN
INTRAVENOUS | Status: DC | PRN
  Administered 2017-12-31: 5 mg via INTRAVENOUS

## 2017-12-31 MED ORDER — LORAZEPAM 2 MG/ML IJ SOLN: 1.0000 mg | Freq: Three times a day (TID) | INTRAMUSCULAR | Status: DC | PRN

## 2017-12-31 MED ORDER — CLINDAMYCIN PHOSPHATE 900 MG/50ML IV SOLN
900.0000 mg | Freq: Three times a day (TID) | INTRAVENOUS | Status: AC
  Administered 2017-12-31 – 2018-01-01 (×3): 900 mg via INTRAVENOUS
  Filled 2017-12-31 (×3): qty 50

## 2017-12-31 MED ORDER — HYDROMORPHONE HCL 1 MG/ML IJ SOLN
1.0000 mg | Freq: Once | INTRAMUSCULAR | Status: AC
  Administered 2017-12-31: 1 mg via INTRAVENOUS
  Filled 2017-12-31: qty 1

## 2017-12-31 MED ORDER — TIZANIDINE HCL 2 MG PO TABS: 2.0000 mg | ORAL_TABLET | Freq: Three times a day (TID) | ORAL | 0 refills | Status: DC | PRN

## 2017-12-31 MED ORDER — CHLORHEXIDINE GLUCONATE 4 % EX LIQD
60.0000 mL | Freq: Once | CUTANEOUS | Status: AC
Start: 2017-12-31 — End: 2017-12-31
  Administered 2017-12-31: 4 via TOPICAL

## 2017-12-31 MED ORDER — BISACODYL 5 MG PO TBEC: 5.0000 mg | DELAYED_RELEASE_TABLET | Freq: Every day | ORAL | Status: DC | PRN

## 2017-12-31 MED ORDER — ACETAMINOPHEN 10 MG/ML IV SOLN
1000.0000 mg | Freq: Once | INTRAVENOUS | Status: DC | PRN
Start: 2017-12-31 — End: 2017-12-31
  Administered 2017-12-31: 1000 mg via INTRAVENOUS

## 2017-12-31 MED ORDER — QUETIAPINE FUMARATE 50 MG PO TABS
400.0000 mg | ORAL_TABLET | Freq: Every day | ORAL | Status: DC
  Administered 2017-12-31 – 2018-01-01 (×2): 400 mg via ORAL
  Filled 2017-12-31 (×2): qty 8

## 2017-12-31 MED ORDER — SUCCINYLCHOLINE CHLORIDE 200 MG/10ML IV SOSY
PREFILLED_SYRINGE | INTRAVENOUS | Status: DC | PRN
  Administered 2017-12-31: 100 mg via INTRAVENOUS

## 2017-12-31 MED ORDER — ONDANSETRON HCL 4 MG/2ML IJ SOLN
4.0000 mg | Freq: Four times a day (QID) | INTRAMUSCULAR | Status: DC | PRN
  Administered 2018-01-01: 4 mg via INTRAVENOUS
  Filled 2017-12-31: qty 2

## 2017-12-31 MED ORDER — HYDROMORPHONE HCL 1 MG/ML IJ SOLN: 0.2500 mg | INTRAMUSCULAR | Status: DC | PRN

## 2017-12-31 MED ORDER — HYDROMORPHONE HCL 1 MG/ML IJ SOLN
INTRAMUSCULAR | Status: DC | PRN
  Administered 2017-12-31 (×3): 0.5 mg via INTRAVENOUS
  Administered 2017-12-31 (×2): 1 mg via INTRAVENOUS
  Administered 2017-12-31: 0.5 mg via INTRAVENOUS

## 2017-12-31 MED ORDER — HYDROCODONE-ACETAMINOPHEN 7.5-325 MG PO TABS: 1.0000 | ORAL_TABLET | Freq: Once | ORAL | Status: DC | PRN

## 2017-12-31 MED ORDER — KETOROLAC TROMETHAMINE 15 MG/ML IJ SOLN
15.0000 mg | Freq: Four times a day (QID) | INTRAMUSCULAR | Status: AC
  Administered 2018-01-01 (×4): 15 mg via INTRAVENOUS
  Filled 2017-12-31 (×4): qty 1

## 2017-12-31 MED ORDER — CEFAZOLIN SODIUM-DEXTROSE 2-4 GM/100ML-% IV SOLN
2.0000 g | INTRAVENOUS | Status: AC
  Administered 2017-12-31: 2 g via INTRAVENOUS
  Filled 2017-12-31: qty 100

## 2017-12-31 MED ORDER — MIDAZOLAM HCL 2 MG/2ML IJ SOLN
INTRAMUSCULAR | Status: DC | PRN
  Administered 2017-12-31: 2 mg via INTRAVENOUS

## 2017-12-31 MED ORDER — LABETALOL HCL 5 MG/ML IV SOLN
INTRAVENOUS | Status: AC
  Filled 2017-12-31: qty 4

## 2017-12-31 MED ORDER — SUGAMMADEX SODIUM 200 MG/2ML IV SOLN
INTRAVENOUS | Status: AC
  Filled 2017-12-31: qty 2

## 2017-12-31 MED ORDER — DOCUSATE SODIUM 100 MG PO CAPS
100.0000 mg | ORAL_CAPSULE | Freq: Two times a day (BID) | ORAL | Status: DC
  Administered 2017-12-31 – 2018-01-01 (×3): 100 mg via ORAL
  Filled 2017-12-31 (×3): qty 1

## 2017-12-31 MED ORDER — ZOLPIDEM TARTRATE 5 MG PO TABS: 5.0000 mg | ORAL_TABLET | Freq: Every evening | ORAL | Status: DC | PRN

## 2017-12-31 MED ORDER — ONDANSETRON HCL 4 MG PO TABS
4.0000 mg | ORAL_TABLET | Freq: Four times a day (QID) | ORAL | Status: DC | PRN
  Administered 2018-01-02: 4 mg via ORAL
  Filled 2017-12-31: qty 1

## 2017-12-31 MED ORDER — LIDOCAINE 2% (20 MG/ML) 5 ML SYRINGE
INTRAMUSCULAR | Status: AC
  Filled 2017-12-31: qty 5

## 2017-12-31 MED ORDER — OXYCODONE HCL 5 MG PO TABS
5.0000 mg | ORAL_TABLET | ORAL | Status: DC | PRN
  Administered 2017-12-31 – 2018-01-01 (×6): 10 mg via ORAL
  Filled 2017-12-31 (×6): qty 2

## 2017-12-31 MED ORDER — ONDANSETRON HCL 4 MG/2ML IJ SOLN
INTRAMUSCULAR | Status: DC | PRN
  Administered 2017-12-31: 4 mg via INTRAVENOUS

## 2017-12-31 MED ORDER — ONDANSETRON HCL 4 MG/2ML IJ SOLN
INTRAMUSCULAR | Status: AC
  Filled 2017-12-31: qty 2

## 2017-12-31 MED ORDER — MEPERIDINE HCL 50 MG/ML IJ SOLN: 6.2500 mg | INTRAMUSCULAR | Status: DC | PRN

## 2017-12-31 MED ORDER — PROPOFOL 10 MG/ML IV BOLUS: 1.0000 mg/kg | Freq: Once | INTRAVENOUS | Status: DC

## 2017-12-31 MED ORDER — PROMETHAZINE HCL 25 MG/ML IJ SOLN: 6.2500 mg | INTRAMUSCULAR | Status: DC | PRN

## 2017-12-31 MED ORDER — FLUOXETINE HCL 20 MG PO CAPS
40.0000 mg | ORAL_CAPSULE | Freq: Every day | ORAL | Status: DC
  Administered 2017-12-31: 40 mg via ORAL
  Filled 2017-12-31 (×3): qty 2

## 2017-12-31 MED ORDER — HYDROMORPHONE HCL 1 MG/ML IJ SOLN: 0.5000 mg | INTRAMUSCULAR | Status: DC | PRN

## 2017-12-31 SURGICAL SUPPLY — 50 items
BAG ZIPLOCK 12X15 (MISCELLANEOUS) ×2 IMPLANT
BIT DRILL 2.9 CANN QC NONSTRL (BIT) ×2 IMPLANT
BIT DRILL 3.8X6 NS (BIT) ×2 IMPLANT
BIT DRILL 4.4 NS (BIT) ×2 IMPLANT
BLADE SURG 15 STRL LF DISP TIS (BLADE) ×1 IMPLANT
BLADE SURG 15 STRL SS (BLADE) ×1
COVER BACK TABLE 60X90IN (DRAPES) ×2 IMPLANT
COVER SURGICAL LIGHT HANDLE (MISCELLANEOUS) ×2 IMPLANT
DRAPE C-ARM 42X120 X-RAY (DRAPES) ×2 IMPLANT
DRAPE C-ARMOR (DRAPES) ×2 IMPLANT
DRAPE ORTHO SPLIT 77X108 STRL (DRAPES) ×2
DRAPE SHEET LG 3/4 BI-LAMINATE (DRAPES) ×4 IMPLANT
DRAPE STERI IOBAN 125X83 (DRAPES) ×2 IMPLANT
DRAPE SURG ORHT 6 SPLT 77X108 (DRAPES) ×2 IMPLANT
DRSG PAD ABDOMINAL 8X10 ST (GAUZE/BANDAGES/DRESSINGS) ×2 IMPLANT
DURAPREP 26ML APPLICATOR (WOUND CARE) ×4 IMPLANT
ELECT PENCIL ROCKER SW 15FT (MISCELLANEOUS) ×2 IMPLANT
ELECT REM PT RETURN 15FT ADLT (MISCELLANEOUS) ×2 IMPLANT
GAUZE SPONGE 4X4 12PLY STRL (GAUZE/BANDAGES/DRESSINGS) ×4 IMPLANT
GAUZE XEROFORM 5X9 LF (GAUZE/BANDAGES/DRESSINGS) ×2 IMPLANT
GLOVE BIO SURGEON STRL SZ8 (GLOVE) ×2 IMPLANT
GLOVE ECLIPSE 7.5 STRL STRAW (GLOVE) ×2 IMPLANT
GUIDEPIN 3.2X17.5 THRD DISP (PIN) ×2 IMPLANT
GUIDEWIRE BALL NOSE 80CM (WIRE) ×2 IMPLANT
K-WIRE ACE 1.6X6 (WIRE) ×4
KIT BASIN OR (CUSTOM PROCEDURE TRAY) ×2 IMPLANT
KWIRE ACE 1.6X6 (WIRE) ×2 IMPLANT
MANIFOLD NEPTUNE II (INSTRUMENTS) ×2 IMPLANT
NAIL TIBIAL 11MMX34.5CM (Nail) ×2 IMPLANT
NS IRRIG 1000ML POUR BTL (IV SOLUTION) ×2 IMPLANT
PACK GENERAL/GYN (CUSTOM PROCEDURE TRAY) ×2 IMPLANT
PAD CAST 4YDX4 CTTN HI CHSV (CAST SUPPLIES) ×2 IMPLANT
PADDING CAST COTTON 4X4 STRL (CAST SUPPLIES) ×2
POSITIONER SURGICAL ARM (MISCELLANEOUS) ×2 IMPLANT
SCREW ACE CAN 4.0 40M (Screw) ×4 IMPLANT
SCREW ACECAP 36MM (Screw) ×2 IMPLANT
SCREW ACECAP 42MM (Screw) ×2 IMPLANT
SCREW ACECAP 46MM (Screw) ×2 IMPLANT
SCREW PROXIMAL DEPUY (Screw) ×2 IMPLANT
SCREW PRXML FT 45X5.5XLCK NS (Screw) ×1 IMPLANT
SCREW PRXML FT 60X5.5XNS LF (Screw) ×1 IMPLANT
SPLINT FIBERGLASS 4X30 (CAST SUPPLIES) ×4 IMPLANT
STAPLER VISISTAT 35W (STAPLE) ×2 IMPLANT
SUT VIC AB 0 CT1 27 (SUTURE) ×1
SUT VIC AB 0 CT1 27XBRD ANTBC (SUTURE) ×1 IMPLANT
SUT VIC AB 2-0 CT1 27 (SUTURE) ×2
SUT VIC AB 2-0 CT1 TAPERPNT 27 (SUTURE) ×2 IMPLANT
TOWEL OR 17X26 10 PK STRL BLUE (TOWEL DISPOSABLE) ×2 IMPLANT
TRAY FOLEY W/METER SILVER 16FR (SET/KITS/TRAYS/PACK) ×2 IMPLANT
WATER STERILE IRR 1000ML POUR (IV SOLUTION) IMPLANT

## 2017-12-31 NOTE — Brief Op Note (Signed)
12/31/2017  6:17 PM  PATIENT:  Michele Webster  42 y.o. female  PRE-OPERATIVE DIAGNOSIS:  RIGHT TIBIA FRACTURE  POST-OPERATIVE DIAGNOSIS:  right tibial fracture  PROCEDURE:  Procedure(s): INTRAMEDULLARY (IM) NAIL TIBIAL (Right)  SURGEON:  Surgeon(s) and Role:    Dorna Leitz, MD - Primary  PHYSICIAN ASSISTANT:   ASSISTANTS: bethune   ANESTHESIA:   general  EBL:  50 mL   BLOOD ADMINISTERED:none  DRAINS: none   LOCAL MEDICATIONS USED:  NONE  SPECIMEN:  No Specimen  DISPOSITION OF SPECIMEN:  N/A  COUNTS:  YES  TOURNIQUET:   Total Tourniquet Time Documented: Thigh (Right) - 51 minutes Total: Thigh (Right) - 51 minutes   DICTATION: .Other Dictation: Dictation Number [id number]  PLAN OF CARE: Admit to inpatient   PATIENT DISPOSITION:  PACU - hemodynamically stable.   Delay start of Pharmacological VTE agent (>24hrs) due to surgical blood loss or risk of bleeding: no

## 2017-12-31 NOTE — Anesthesia Preprocedure Evaluation (Addendum)
Anesthesia Evaluation  Patient identified by MRN, date of birth, ID band Patient awake    Reviewed: Allergy & Precautions, NPO status , Patient's Chart, lab work & pertinent test results  Airway Mallampati: II  TM Distance: >3 FB Neck ROM: Full    Dental no notable dental hx.    Pulmonary Current Smoker,    Pulmonary exam normal breath sounds clear to auscultation       Cardiovascular negative cardio ROS Normal cardiovascular exam Rhythm:Regular Rate:Normal     Neuro/Psych PSYCHIATRIC DISORDERS Bipolar Disorder negative neurological ROS     GI/Hepatic negative GI ROS, Neg liver ROS,   Endo/Other  negative endocrine ROS  Renal/GU negative Renal ROS  negative genitourinary   Musculoskeletal negative musculoskeletal ROS (+)   Abdominal   Peds  Hematology negative hematology ROS (+)   Anesthesia Other Findings   Reproductive/Obstetrics                           . Lab Results  Component Value Date   WBC 18.5 (H) 12/31/2017   HGB 12.5 12/31/2017   HCT 37.5 12/31/2017   MCV 87.0 12/31/2017   PLT 344 12/31/2017   Lab Results  Component Value Date   CREATININE 0.85 12/31/2017   BUN 10 12/31/2017   NA 134 (L) 12/31/2017   K 4.5 12/31/2017   CL 104 12/31/2017   CO2 24 12/31/2017   Drugs of Abuse     Component Value Date/Time   LABOPIA POSITIVE (A) 12/31/2017 1450   COCAINSCRNUR POSITIVE (A) 12/31/2017 1450   LABBENZ NONE DETECTED 12/31/2017 1450   AMPHETMU NONE DETECTED 12/31/2017 1450   THCU POSITIVE (A) 12/31/2017 1450   LABBARB NONE DETECTED 12/31/2017 1450     Anesthesia Physical Anesthesia Plan  ASA: III  Anesthesia Plan: General   Post-op Pain Management:    Induction: Intravenous  PONV Risk Score and Plan: Treatment may vary due to age or medical condition  Airway Management Planned: Oral ETT  Additional Equipment:   Intra-op Plan:   Post-operative Plan:  Extubation in OR  Informed Consent:   Dental advisory given  Plan Discussed with: CRNA  Anesthesia Plan Comments:        Pt not tachycardic or hypertensive. Will proceed with precautions due to pos Cocaine in Tox screen. Dr Berenice Primas aware. Anesthesia Quick Evaluation

## 2017-12-31 NOTE — Discharge Instructions (Signed)
Elevate your right leg as much as possible above your heart. Ambulate nonweightbearing on the right with crutches or a walker. You may move your toes as tolerated.

## 2017-12-31 NOTE — H&P (Addendum)
PREOPERATIVE H&P  Chief Complaint: r lowwer ext pain  HPI: Michele Webster is a 42 y.o. female who presents for evaluation of r lower ext pain. It has been present12 hrs since a fall in her home and has been worsening. She has failed conservative measures. Pain is rated as moderate.  Past Medical History:  Diagnosis Date  . Anxiety   . Bipolar 1 disorder ([place])   . Depression   . High cholesterol   . Substance abuse [facility])    Past Surgical History:  Procedure Laterality Date  . TUBAL LIGATION     Social History   Socioeconomic History  . Marital status: Divorced    Spouse name: None  . Number of children: None  . Years of education: None  . Highest education level: None  Social Needs  . Financial resource strain: None  . Food insecurity - worry: None  . Food insecurity - inability: None  . Transportation needs - medical: None  . Transportation needs - non-medical: None  Occupational History  . None  Tobacco Use  . Smoking status: Current Every Day Smoker    Packs/day: 1.00    Types: Cigarettes  . Smokeless tobacco: Never Used  Substance and Sexual Activity  . Alcohol use: No    Comment: In [facility] at this time for drug and alcohol abuse  . Drug use: No    Comment: At [facility] for rehab  . Sexual activity: No    Birth control/protection: Surgical  Other Topics Concern  . None  Social History Narrative   ** Merged History Encounter **       Family History  Problem Relation Age of Onset  . Cancer Maternal Grandmother        breast   Allergies  Allergen Reactions  . Ceclor [Cefaclor] Hives  . Sertraline Other (See Comments)    Makes her crazy  . Ceclor [Cefaclor] Hives, Nausea And Vomiting and Swelling   Prior to Admission medications   Medication Sig Start Date End Date Taking? Authorizing Provider  FLUoxetine (PROZAC) 40 MG capsule Take 1 capsule (40 mg total) by mouth daily. 11/04/15  Yes Withrow, Elyse Jarvis, FNP  hydrOXYzine (ATARAX/VISTARIL) 25 MG  tablet Take 1 tablet (25 mg total) by mouth 3 (three) times daily as needed for anxiety. 11/04/15  Yes Withrow, Elyse Jarvis, FNP  QUEtiapine (SEROQUEL) 100 MG tablet Take 1 tablet (100 mg total) by mouth at bedtime. 11/04/15  Yes Withrow, Elyse Jarvis, FNP  lidocaine (LIDODERM) 5 % Place 1 patch onto the skin daily. Remove & Discard patch within 12 hours or as directed by MD Patient not taking: Reported on 12/31/2017 11/04/15   Withrow, Elyse Jarvis, FNP  metroNIDAZOLE (FLAGYL) 500 MG tablet Take 1 tablet (500 mg total) by mouth 2 (two) times daily. Patient not taking: Reported on 12/31/2017 12/31/16   Davonna Belling, MD  ramelteon (ROZEREM) 8 MG tablet Take 1 tablet (8 mg total) by mouth at bedtime. Patient not taking: Reported on 12/31/2017 11/04/15   Benjamine Mola, FNP     Positive ROS: none  All other systems have been reviewed and were otherwise negative with the exception of those mentioned in the HPI and as above.  Physical Exam: Vitals:   12/31/17 1230 12/31/17 1330  BP: 128/90 (!) 130/92  Pulse: 68 71  Resp:  18  Temp:    SpO2:  98%    General: Alert, no acute distress Cardiovascular: No pedal edema Respiratory: No cyanosis, no use  of accessory musculature GI: No organomegaly, abdomen is soft and non-tender Skin: No lesions in the area of chief complaint Neurologic: Sensation intact distally Psychiatric: Patient is competent for consent with normal mood and affect Lymphatic: No axillary or cervical lymphadenopathy  MUSCULOSKELETAL: r leg: +sts and pain with all rom  Assessment/Plan: 42 yo female with r tib/fib fx with displacement.  Pt needs im rod r tibia   The risks benefits and alternatives were discussed with the patient including but not limited to the risks of nonoperative treatment, versus surgical intervention including infection, bleeding, nerve injury, malunion, nonunion, hardware prominence, hardware failure, need for hardware removal, blood clots, cardiopulmonary  complications, morbidity, mortality, among others, and they were willing to proceed.  Predicted outcome is good, although there will be at least a six to nine month expected recovery.  I had an opportunity to further review the patient's x-rays.  These unfortunately show that she has a medial malleolus fracture as well.  My plan at this point would be to do her intramedullary rod of the tibia as carefully as possible and then proceed with stabilization of the medial malleolus fracture.  We may try to stabilize the fracture but just need to be aware of those wires during the tibial rodding portion of the surgery.  Alta Corning, MD 12/31/2017 2:03 PM

## 2017-12-31 NOTE — ED Notes (Signed)
Patient transported to X-ray 

## 2017-12-31 NOTE — ED Provider Notes (Signed)
[facility] DEPT Provider Note   CSN: [id number] Arrival date & time: 12/31/17  1117     History   Chief Complaint Chief Complaint  Patient presents with  . Fall  . Foot Pain  . Ankle Pain    HPI Michele Webster is a 42 y.o. female.  HPI Presents 12 hours after a fall. Patient recalls the fall in its entirety, states that she was sleepy, from having taken her medication to assist with sleeping, when she slipped, fell, twisting her right ankle. Since that time she has had these increasing pain, though she was able to sleep for a few hours. She has preserved sensation, but cannot move her toes secondary to pain, swelling. No other injuries, including knee pain, hip pain, head pain, or weakness anywhere else. She has not taken pain medication and thus far, though she did receive IV pain medication  while in transport via EMS. This helped marginally. Past Medical History:  Diagnosis Date  . Anxiety   . Bipolar 1 disorder ([place])   . Depression   . High cholesterol   . Substance abuse [facility])     Patient Active Problem List   Diagnosis Date Noted  . Cocaine use disorder, moderate, dependence ([place]) 11/01/2015  . Cannabis use disorder, moderate, dependence ([place]) 11/01/2015  . Bipolar disorder with current episode depressed ([place]) 10/31/2015  . Suicide attempt ([place])   . Cocaine abuse ([place]) 01/24/2014  . Polysubstance abuse ([place]) 01/24/2014  . Depression 01/24/2014  . Anal fissure 02/05/2012    Past Surgical History:  Procedure Laterality Date  . TUBAL LIGATION      OB History    No data available       Home Medications    Prior to Admission medications   Medication Sig Start Date End Date Taking? Authorizing Provider  FLUoxetine (PROZAC) 40 MG capsule Take 1 capsule (40 mg total) by mouth daily. 11/04/15  Yes Withrow, Elyse Jarvis, FNP  hydrOXYzine (ATARAX/VISTARIL) 25 MG tablet Take 1 tablet (25 mg total) by mouth 3 (three) times daily  as needed for anxiety. 11/04/15  Yes Withrow, Elyse Jarvis, FNP  QUEtiapine (SEROQUEL) 100 MG tablet Take 1 tablet (100 mg total) by mouth at bedtime. 11/04/15  Yes Withrow, Elyse Jarvis, FNP  lidocaine (LIDODERM) 5 % Place 1 patch onto the skin daily. Remove & Discard patch within 12 hours or as directed by MD Patient not taking: Reported on 12/31/2017 11/04/15   Withrow, Elyse Jarvis, FNP  metroNIDAZOLE (FLAGYL) 500 MG tablet Take 1 tablet (500 mg total) by mouth 2 (two) times daily. Patient not taking: Reported on 12/31/2017 12/31/16   Davonna Belling, MD  ramelteon (ROZEREM) 8 MG tablet Take 1 tablet (8 mg total) by mouth at bedtime. Patient not taking: Reported on 12/31/2017 11/04/15   Benjamine Mola, FNP    Family History Family History  Problem Relation Age of Onset  . Cancer Maternal Grandmother        breast    Social History Social History   Tobacco Use  . Smoking status: Current Every Day Smoker    Packs/day: 1.00    Types: Cigarettes  . Smokeless tobacco: Never Used  Substance Use Topics  . Alcohol use: No    Comment: In [facility] at this time for drug and alcohol abuse  . Drug use: No    Comment: At [facility] for rehab     Allergies   Ceclor [cefaclor]; Sertraline; and Ceclor [cefaclor]   Review  of Systems Review of Systems  Constitutional:       Per HPI, otherwise negative  HENT:       Per HPI, otherwise negative  Respiratory:       Per HPI, otherwise negative  Cardiovascular:       Per HPI, otherwise negative  Gastrointestinal: Negative for vomiting.  Endocrine:       Negative aside from HPI  Genitourinary:       Neg aside from HPI   Musculoskeletal:       Per HPI, otherwise negative  Skin: Negative for wound.  Neurological: Negative for syncope.  Psychiatric/Behavioral: The patient is nervous/anxious.      Physical Exam Updated Vital Signs BP 140/88 (BP Location: Right Arm)   Pulse (!) 53   Temp 97.6 F (36.4 C) (Oral)   Resp 19   Ht 5' 5"  (1.651 m)   LMP  12/17/2017   SpO2 97%   BMI 34.61 kg/m    Physical Exam  Constitutional: She is oriented to person, place, and time. She appears well-developed and well-nourished.  Uncomfortable appearing female awake and alert, here with her mother.  HENT:  Head: Normocephalic and atraumatic.  Eyes: Conjunctivae and EOM are normal.  Cardiovascular: Normal rate and regular rhythm.  Pulmonary/Chest: Effort normal. No respiratory distress.  Abdominal: She exhibits no distension.  Musculoskeletal: She exhibits no edema.       Right knee: Normal.       Right ankle: She exhibits decreased range of motion, swelling and deformity. She exhibits no laceration and normal pulse. Tenderness.  Neurological: She is alert and oriented to person, place, and time. No cranial nerve deficit.  Skin: Skin is warm and dry.  Psychiatric: She has a normal mood and affect.  Nursing note and vitals reviewed.    ED Treatments / Results   Radiology No results found.  Procedures .Splint Application Date/Time: 2/72/5366 1:11 PM Performed by: Carmin Muskrat, MD Authorized by: Carmin Muskrat, MD   Consent:    Consent obtained:  Verbal   Consent given by:  Patient   Risks discussed:  Pain and swelling   Alternatives discussed:  No treatment Pre-procedure details:    Sensation:  Normal Procedure details:    Laterality:  Right   Location:  Leg   Leg:  R lower leg   Strapping: no     Cast type:  Long leg   Splint type:  Long leg and sugar tong   Supplies:  Ortho-Glass Post-procedure details:    Pain:  Improved   Sensation:  Normal   Skin color:  Pink   Patient tolerance of procedure:  Tolerated well, no immediate complications Comments:     Splint applied with assistance of orthopedic technician and nursing staff. Procedure well tolerated, and throughout the procedure the patient was interactive.   (including critical care time)  Medications Ordered in ED Medications  chlorhexidine (HIBICLENS) 4 %  liquid 4 application (not administered)  povidone-iodine 10 % swab 2 application (not administered)  ceFAZolin (ANCEF) IVPB 2g/100 mL premix (not administered)  lactated ringers infusion (not administered)  HYDROmorphone (DILAUDID) injection 1 mg (1 mg Intravenous Given 12/31/17 1221)  HYDROmorphone (DILAUDID) injection 1 mg (1 mg Intravenous Given 12/31/17 1246)  HYDROmorphone (DILAUDID) injection 1 mg (1 mg Intravenous Given 12/31/17 1436)     Initial Impression / Assessment and Plan / ED Course  I have reviewed the triage vital signs and the nursing notes.  Pertinent labs & imaging results that were  available during my care of the patient were reviewed by me and considered in my medical decision making (see chart for details).   Update: I discussed patient's case with her orthopedic doctor on-call, Dr. Berenice Primas.  Patient will go to the operating room today.  2:50 PM Patient awake and alert, his had no new complaints. Patient is preparing for transfer to operative repair.  Patient presents after fall that occurred about 12 hours prior to ED arrival, with obvious deformity, and is found to have a complex, comminuted distal tibia, fibula fracture as well as proximal fibula fracture. Patient is distally neurovascular intact, has no distention immediately concerning for compartment syndrome. After splint education by myself and the orthopedic technician and nursing staff, the patient was provided pain management, awaiting operative repair.  Final Clinical Impressions(s) / ED Diagnoses   Final diagnoses:  Fall, initial encounter  Closed fracture of right lower extremity, initial encounter     Carmin Muskrat, MD 12/31/17 1454

## 2017-12-31 NOTE — Anesthesia Procedure Notes (Signed)
Procedure Name: Intubation Date/Time: 12/31/2017 4:04 PM Performed by: Sharlette Dense, CRNA Patient Re-evaluated:Patient Re-evaluated prior to induction Oxygen Delivery Method: Circle system utilized Preoxygenation: Pre-oxygenation with 100% oxygen Induction Type: IV induction and Rapid sequence Laryngoscope Size: Miller and 2 Grade View: Grade I Tube type: Oral Tube size: 7.5 mm Number of attempts: 1 Airway Equipment and Method: Stylet Placement Confirmation: ETT inserted through vocal cords under direct vision,  positive ETCO2 and breath sounds checked- equal and bilateral Secured at: 20 cm Tube secured with: Tape Dental Injury: Teeth and Oropharynx as per pre-operative assessment

## 2017-12-31 NOTE — ED Notes (Signed)
Bed: NP00 Expected date:  Expected time:  Means of arrival:  Comments: EMS-fall/leg pain

## 2017-12-31 NOTE — ED Triage Notes (Signed)
[facility] patient come in for falling around 1230am this morning. Patient states that she got up to get another piece of pizza after taking her night time medications and patient lost balance and fell. Reports taht she laid on ground for an hour thinking it would get better but as time gone on today, swelling and pain has gotten worse.  Patient c/o pain in right leg from knee to toes. Patient has splint on lower extremity placed by EMS. Patient given Fentanyl 254mg in route via 22g in left AC.

## 2017-12-31 NOTE — Transfer of Care (Addendum)
Immediate Anesthesia Transfer of Care Note  Patient: Michele Webster  Procedure(s) Performed: INTRAMEDULLARY (IM) NAIL TIBIAL (Right )  Patient Location: PACU  Anesthesia Type:General  Level of Consciousness: awake, sedated and patient cooperative  Airway & Oxygen Therapy: Patient Spontanous Breathing and Patient connected to face mask oxygen  Post-op Assessment: Report given to RN, Post -op Vital signs reviewed and stable and Patient moving all extremities X 4  Post vital signs: stable  Last Vitals:  Vitals:   12/31/17 1230 12/31/17 1330  BP: 128/90 (!) 130/92  Pulse: 68 71  Resp:  18  Temp:    SpO2:  98%    Last Pain:  Vitals:   12/31/17 1429  TempSrc:   PainSc: 8          Complications: No apparent anesthesia complications

## 2018-01-01 ENCOUNTER — Encounter (HOSPITAL_COMMUNITY): Payer: Self-pay | Admitting: Orthopedic Surgery

## 2018-01-01 MED ORDER — FLUOXETINE HCL 20 MG PO CAPS
40.0000 mg | ORAL_CAPSULE | Freq: Every day | ORAL | Status: DC
  Administered 2018-01-01 – 2018-01-02 (×2): 40 mg via ORAL
  Filled 2018-01-01 (×2): qty 2

## 2018-01-01 MED ORDER — HYDROCODONE-ACETAMINOPHEN 10-325 MG PO TABS
1.0000 | ORAL_TABLET | ORAL | Status: DC | PRN
  Administered 2018-01-02 (×2): 2 via ORAL
  Filled 2018-01-01 (×2): qty 2

## 2018-01-01 MED ORDER — PROMETHAZINE HCL 25 MG/ML IJ SOLN
12.5000 mg | Freq: Four times a day (QID) | INTRAMUSCULAR | Status: DC | PRN
Start: 2018-01-01 — End: 2018-01-02
  Administered 2018-01-01: 12.5 mg via INTRAVENOUS
  Filled 2018-01-01: qty 1

## 2018-01-01 NOTE — Progress Notes (Signed)
    Durable Medical Equipment  (From admission, onward)        Start     Ordered   01/01/18 1237  For home use only DME Bedside commode  Once    Question:  Patient needs a bedside commode to treat with the following condition  Answer:  Fracture   01/01/18 1238

## 2018-01-01 NOTE — Progress Notes (Signed)
01/01/18 [place] PA called regarding patient c/o of nausea and vomiting and diarrhea this evening. Order received to give phenergan iv and try norco for pain. Will continue to monitor patient

## 2018-01-01 NOTE — Evaluation (Signed)
Occupational Therapy Evaluation Patient Details Name: JAQUAY MORNEAULT MRN: [id number] DOB: July 29, 1976 Today's Date: 01/01/2018    History of Present Illness This 42 year old female fell and sustained R tib/fib fxs.  s/p IM rod of tibia and ORIF of medial malleolus.  PMH:  bipolar, anxiety, depression, and substance abuse   Clinical Impression   Pt was admitted for the above.  She was independent with adls prior to admission.  Pt is now NWB with RLE and needs min A for safety for transfers. Will follow in acute for toilet transfer and, if allowed to shower, by MD, tub bench transfer    Follow Up Recommendations  Supervision/Assistance - 24 hour    Equipment Recommendations  3 in 1 bedside commode    Recommendations for Other Services       Precautions / Restrictions Precautions Precautions: Fall Restrictions RLE Weight Bearing: Non weight bearing      Mobility Bed Mobility               General bed mobility comments: supervision  Transfers Overall transfer level: Needs assistance Equipment used: Rolling walker (2 wheeled) Transfers: Sit to/from [facility] Sit to Stand: Min assist Stand pivot transfers: Min assist       General transfer comment: light steadying assistance    Balance                                           ADL either performed or assessed with clinical judgement   ADL Overall ADL's : Needs assistance/impaired Eating/Feeding: Independent   Grooming: Set up;Sitting   Upper Body Bathing: Set up;Sitting   Lower Body Bathing: Minimal assistance;Sit to/from stand   Upper Body Dressing : Set up;Sitting   Lower Body Dressing: Moderate assistance;Sit to/from stand   Toilet Transfer: Minimal assistance;Stand-pivot;BSC;RW   Toileting- Clothing Manipulation and Hygiene: Minimal assistance;Sit to/from stand         General ADL Comments: pt will have 24/7 at home. She has a Secondary school teacher and doesn't feel she  needs a long sponge.  Will try to clarify if pt is allowed to wrap leg and use tub bench to shower vs sponge bathing.       Vision         Perception     Praxis      Pertinent Vitals/Pain Pain Assessment: Faces Faces Pain Scale: Hurts little more Pain Location: R ankle Pain Descriptors / Indicators: Grimacing Pain Intervention(s): Limited activity within patient's tolerance;Monitored during session;Premedicated before session;Repositioned;Ice applied     Hand Dominance     Extremity/Trunk Assessment Upper Extremity Assessment Upper Extremity Assessment: Overall WFL for tasks assessed           Communication Communication Communication: No difficulties   Cognition Arousal/Alertness: Awake/alert Behavior During Therapy: WFL for tasks assessed/performed Overall Cognitive Status: Within Functional Limits for tasks assessed                                     General Comments       Exercises     Shoulder Instructions      Home Living Family/patient expects to be discharged to:: Private residence Living Arrangements: Parent;Children Available Help at Discharge: Family Type of Home: Apartment  Bathroom Shower/Tub: Tub/shower unit(curtain)   Biochemist, clinical: Standard                Prior Functioning/Environment Level of Independence: Independent                 OT Problem List: Pain;Decreased knowledge of use of DME or AE;Decreased activity tolerance      OT Treatment/Interventions: Self-care/ADL training;DME and/or AE instruction;Patient/family education;Balance training    OT Goals(Current goals can be found in the care plan section) Acute Rehab OT Goals Patient Stated Goal: home OT Goal Formulation: With patient Time For Goal Achievement: 01/15/18 Potential to Achieve Goals: Good ADL Goals Pt Will Transfer to Toilet: with min guard assist;bedside commode;ambulating(maintaining NWB, approximately 10  feet) Additional ADL Goal #1: If MD allows pt to wrap leg and shower, pt will demonstrate safe tub bench transfer with min A to manage RLE  OT Frequency: Min 2X/week   Barriers to D/C:            Co-evaluation PT/OT/SLP Co-Evaluation/Treatment: Yes Reason for Co-Treatment: For patient/therapist safety PT goals addressed during session: Mobility/safety with mobility OT goals addressed during session: ADL's and self-care      AM-PAC PT "6 Clicks" Daily Activity     Outcome Measure Help from another person eating meals?: None Help from another person taking care of personal grooming?: A Little Help from another person toileting, which includes using toliet, bedpan, or urinal?: A Little Help from another person bathing (including washing, rinsing, drying)?: A Little Help from another person to put on and taking off regular upper body clothing?: A Little Help from another person to put on and taking off regular lower body clothing?: A Lot 6 Click Score: 18   End of Session    Activity Tolerance: Patient tolerated treatment well Patient left: in chair;with call bell/phone within reach  OT Visit Diagnosis: Pain Pain - Right/Left: Right Pain - part of body: Ankle and joints of foot                Time: 7035-0093 OT Time Calculation (min): 23 min Charges:  OT General Charges $OT Visit: 1 Visit OT Evaluation $OT Eval Low Complexity: 1 Low G-Codes:     [place], OTR/L [phone] 01/01/2018  SPENCER,MARYELLEN 01/01/2018, 1:19 PM

## 2018-01-01 NOTE — Progress Notes (Signed)
Physical Therapy Treatment Patient Details Name: Michele Webster MRN: [id number] DOB: 1975-12-08 Today's Date: 01/01/2018    History of Present Illness This 42 year old female fell and sustained R tib/fib fxs.  s/p IM rod of tibia and ORIF of medial malleolus.  PMH:  bipolar, anxiety, depression, and substance abuse    PT Comments    The patient ambulated short distance  With Rw and maintained NWB. Should be able to return home with short distance ambulation.  Follow Up Recommendations  No PT follow up     Equipment Recommendations  3in1 (PT)    Recommendations for Other Services       Precautions / Restrictions Precautions Precautions: Fall Restrictions RLE Weight Bearing: Non weight bearing    Mobility  Bed Mobility               General bed mobility comments: supervision  Transfers Overall transfer level: Needs assistance Equipment used: Rolling walker (2 wheeled) Transfers: Sit to/from Stand Sit to Stand: Min guard Stand pivot transfers: Min assist       General transfer comment: light steadying assistance  Ambulation/Gait Ambulation/Gait assistance: Min guard Ambulation Distance (Feet): 10 Feet(x2) Assistive device: Rolling walker (2 wheeled) Gait Pattern/deviations: Step-to pattern     General Gait Details: able to remain NWB while hopping a few feet.   Stairs            Wheelchair Mobility    Modified Rankin (Stroke Patients Only)       Balance                                            Cognition Arousal/Alertness: Awake/alert Behavior During Therapy: WFL for tasks assessed/performed Overall Cognitive Status: Within Functional Limits for tasks assessed                                        Exercises      General Comments        Pertinent Vitals/Pain Pain Assessment: Faces Pain Score: 3  Faces Pain Scale: Hurts little more Pain Location: R ankle Pain Descriptors / Indicators:  Grimacing;Aching Pain Intervention(s): Monitored during session;Ice applied;Premedicated before session    Home Living Family/patient expects to be discharged to:: Private residence Living Arrangements: Parent;Children Available Help at Discharge: Family Type of Home: Apartment              Prior Function Level of Independence: Independent          PT Goals (current goals can now be found in the care plan section) Acute Rehab PT Goals Patient Stated Goal: home PT Goal Formulation: With patient Time For Goal Achievement: 01/02/18 Potential to Achieve Goals: Good    Frequency    Min 5X/week      PT Plan Current plan remains appropriate    Co-evaluation PT/OT/SLP Co-Evaluation/Treatment: Yes Reason for Co-Treatment: For patient/therapist safety PT goals addressed during session: Mobility/safety with mobility OT goals addressed during session: ADL's and self-care      AM-PAC PT "6 Clicks" Daily Activity  Outcome Measure  Difficulty turning over in bed (including adjusting bedclothes, sheets and blankets)?: None Difficulty moving from lying on back to sitting on the side of the bed? : None Difficulty sitting down on and standing up from a chair  with arms (e.g., wheelchair, bedside commode, etc,.)?: A Little Help needed moving to and from a bed to chair (including a wheelchair)?: A Little Help needed walking in hospital room?: A Little Help needed climbing 3-5 steps with a railing? : Total 6 Click Score: 18    End of Session Equipment Utilized During Treatment: Gait belt Activity Tolerance: Patient tolerated treatment well Patient left: in bed;with call bell/phone within reach Nurse Communication: Mobility status PT Visit Diagnosis: Unsteadiness on feet (R26.81);History of falling (Z91.81)     Time: 3329-5188 PT Time Calculation (min) (ACUTE ONLY): 21 min  Charges:  $Gait Training: 8-22 mins                    G CodesTresa Endo  PT [phone]   Claretha Cooper 01/01/2018, 4:02 PM

## 2018-01-01 NOTE — Op Note (Signed)
Michele Webster, Michele Webster NO.:  [id number]  MEDICAL RECORD NO.:  [id number]  LOCATION:  ED                           FACILITY:  [facility]  PHYSICIAN:  Alta Corning, M.D.   DATE OF BIRTH:  01-08-1976  DATE OF PROCEDURE:  12/31/2017 DATE OF DISCHARGE:                              OPERATIVE REPORT   PREOPERATIVE DIAGNOSIS:  Comminuted displaced tibia fracture with segmental fibular fracture.  POSTOPERATIVE DIAGNOSES: 1. Comminuted displaced tibia fracture with segmental fibular     fracture. 2. Medial malleolus fracture.  PRINCIPAL PROCEDURES: 1. Intramedullary rod fixation of comminuted displaced tibia fracture. 2. Open reduction internal fixation of medial malleolus fracture. 3. Interpretation of multiple intraoperative fluoroscopic images.  SURGEON:  Alta Corning, M.D.  ASSISTANT:  Gary Fleet, P.A.  ANESTHESIA:  General.  BRIEF HISTORY:  Michele Webster is a 42 year old female with a history of having fallen last night, she suffered a comminuted displaced tibia fracture.  She tried to wait it out, but did not do well with that.  She ultimately presented to the emergency room with a fracture.  We were consulted for management.  X-ray showed comminuted displaced tibia fracture and brought to the operating room for that procedure. Intraoperatively, I did notice that she had a nondisplaced medial malleolus fracture, and we felt like that needed to be fixed as well. She was brought to the operating room for the initial procedure.  DESCRIPTION OF PROCEDURE:  The patient was taken to the operating room and after adequate anesthesia was obtained with general anesthetic, the patient was placed supine on the operating table.  The right leg was then prepped and draped in usual sterile fashion.  Following this, the leg was elevated for 3 minutes.  Blood pressure tourniquet was inflated to 300 mmHg.  The patient was placed onto a high triangle.  Incision was made  from the patella down to the tibial tubercle.  Subcutaneous tissue was dissected down to the level of the patellar tendon.  Just medial to the patellar tendon, an incision was made into the joint capsule.  A guidewire was placed dead center in the tibia.  This was confirmed under fluoro and it was advanced down to the anterior tibia.  We then over- reamed this and then put a guidewire down to the ankle.  We measured it for 34 cm, reamed her to 12.5 mm, and put an 11 x 34 cm rod down the leg and then locked it proximally with 2 oblique screws, then went distally, and at this point, fixed the medial malleolus with 2 cannulated screws in parallel, left them short of the rod, and they were posterior to the rod.  Once this was completed, the attention was turned towards the distal interlock and under freehand guidance, we did distal interlocking of the 3 distal screws.  Once this was completed, the final fluoroscopic images were taken and near anatomic reduction had been achieved.  There was a little bit of translation of the posterior fragment distally, but I did not feel like I could change that at all.  At this point, the wounds were all irrigated, suctioned dry, closed with staples, and top closed with 0 Vicryl,  2-0 Vicryl, and staples.  Sterile compressive dressing was applied as well as a posterior splint.  The patient was taken to the recovery and was noted to be in satisfactory condition. The estimated blood loss for the procedure was minimal.  Tourniquet time was around 50 minutes, but the final can be gotten from the anesthetic record.  Multiple intraoperative fluoroscopic images were taken to assess the fit fill and location.     Alta Corning, M.D.     [name]  D:  12/31/2017  T:  01/01/2018  Job:  [id number]  cc:   Alta Corning, M.D. Fax: [phone]

## 2018-01-01 NOTE — Evaluation (Signed)
Physical Therapy Evaluation Patient Details Name: Michele Webster MRN: [id number] DOB: 06-29-1976 Today's Date: 01/01/2018   History of Present Illness  This 42 year old female fell and sustained R tib/fib fxs.  s/p IM rod of tibia and ORIF of medial malleolus.  PMH:  bipolar, anxiety, depression, and substance abuse  Clinical Impression  The patient tolerated mobilizing tom BSC and 5' ambulation with Rw. Will ambulate again next visit. Plans Dc home. Pt admitted with above diagnosis. Pt currently with functional limitations due to the deficits listed below (see PT Problem List).  Pt will benefit from skilled PT to increase their independence and safety with mobility to allow discharge to the venue listed below.       Follow Up Recommendations No PT follow up    Equipment Recommendations  3in1 (PT)    Recommendations for Other Services       Precautions / Restrictions Precautions Precautions: Fall Restrictions RLE Weight Bearing: Non weight bearing      Mobility  Bed Mobility               General bed mobility comments: supervision  Transfers Overall transfer level: Needs assistance Equipment used: Rolling walker (2 wheeled) Transfers: Sit to/from [facility] Sit to Stand: Min assist Stand pivot transfers: Min assist       General transfer comment: light steadying assistance  Ambulation/Gait Ambulation/Gait assistance: Min assist Ambulation Distance (Feet): 8 Feet Assistive device: Rolling walker (2 wheeled) Gait Pattern/deviations: Step-to pattern     General Gait Details: able to remain NWB while hopping a few feet.  Stairs            Wheelchair Mobility    Modified Rankin (Stroke Patients Only)       Balance                                             Pertinent Vitals/Pain Pain Assessment: Faces Faces Pain Scale: Hurts little more Pain Location: R ankle Pain Descriptors / Indicators:  Grimacing Pain Intervention(s): Monitored during session;Premedicated before session;Limited activity within patient's tolerance    Home Living Family/patient expects to be discharged to:: Private residence Living Arrangements: Parent;Children Available Help at Discharge: Family Type of Home: Apartment                Prior Function Level of Independence: Independent               Hand Dominance        Extremity/Trunk Assessment   Upper Extremity Assessment Upper Extremity Assessment: Overall WFL for tasks assessed    Lower Extremity Assessment Lower Extremity Assessment: RLE deficits/detail RLE Deficits / Details: short leg cast/bivalved with ace    Cervical / Trunk Assessment Cervical / Trunk Assessment: Normal  Communication   Communication: No difficulties  Cognition Arousal/Alertness: Awake/alert Behavior During Therapy: WFL for tasks assessed/performed Overall Cognitive Status: Within Functional Limits for tasks assessed                                        General Comments      Exercises     Assessment/Plan    PT Assessment Patient needs continued PT services  PT Problem List Decreased activity tolerance;Decreased mobility;Decreased knowledge of use of DME;Decreased safety awareness;Decreased knowledge  of precautions;Pain       PT Treatment Interventions DME instruction;Gait training;Functional mobility training;Therapeutic activities;Patient/family education    PT Goals (Current goals can be found in the Care Plan section)  Acute Rehab PT Goals Patient Stated Goal: home PT Goal Formulation: With patient Time For Goal Achievement: 01/02/18 Potential to Achieve Goals: Good    Frequency Min 5X/week   Barriers to discharge        Co-evaluation PT/OT/SLP Co-Evaluation/Treatment: Yes Reason for Co-Treatment: For patient/therapist safety PT goals addressed during session: Mobility/safety with mobility OT goals  addressed during session: ADL's and self-care       AM-PAC PT "6 Clicks" Daily Activity  Outcome Measure Difficulty turning over in bed (including adjusting bedclothes, sheets and blankets)?: None Difficulty moving from lying on back to sitting on the side of the bed? : None Difficulty sitting down on and standing up from a chair with arms (e.g., wheelchair, bedside commode, etc,.)?: A Little Help needed moving to and from a bed to chair (including a wheelchair)?: A Little Help needed walking in hospital room?: A Little Help needed climbing 3-5 steps with a railing? : A Little 6 Click Score: 20    End of Session Equipment Utilized During Treatment: Gait belt Activity Tolerance: Patient tolerated treatment well Patient left: in chair;with call bell/phone within reach Nurse Communication: Mobility status PT Visit Diagnosis: Unsteadiness on feet (R26.81);History of falling (Z91.81)    Time: 5859-2924 PT Time Calculation (min) (ACUTE ONLY): 23 min   Charges:   PT Evaluation $PT Eval Low Complexity: 1 Low     PT G CodesTresa Endo PT [phone]   Claretha Cooper 01/01/2018, 1:40 PM

## 2018-01-02 NOTE — Progress Notes (Signed)
Subjective: 2 Days Post-Op Procedure(s) (LRB): INTRAMEDULLARY (IM) NAIL TIBIAL (Right) Patient reports pain as mild.    Objective: Vital signs in last 24 hours: Temp:  [97.9 F (36.6 C)-98.2 F (36.8 C)] 97.9 F (36.6 C) (02/02 0655) Pulse Rate:  [64-83] 75 (02/02 0655) Resp:  [14-19] 14 (02/02 0655) BP: (115-126)/(69-74) 120/74 (02/02 0655) SpO2:  [98 %-100 %] 99 % (02/02 0655)  Intake/Output from previous day: 02/01 0701 - 02/02 0700 In: 1020 [P.O.:1020] Out: -  Intake/Output this shift: No intake/output data recorded.  Recent Labs    12/31/17 1515  HGB 12.5   Recent Labs    12/31/17 1515  WBC 18.5*  RBC 4.31  HCT 37.5  PLT 344   Recent Labs    12/31/17 1515  NA 134*  K 4.5  CL 104  CO2 24  BUN 10  CREATININE 0.85  GLUCOSE 107*  CALCIUM 8.4*   No results for input(s): LABPT, INR in the last 72 hours.  Neurologically intact ABD soft Neurovascular intact Compartment soft  Assessment/Plan: 2 Days Post-Op Procedure(s) (LRB): INTRAMEDULLARY (IM) NAIL TIBIAL (Right) Up with therapy Discharge home with home health  Alta Corning 01/02/2018, 8:44 AM

## 2018-01-02 NOTE — Care Management Note (Signed)
Case Management Note  Patient Details  Name: Michele Webster MRN: [id number] Date of Birth: 09-18-1976  Subjective/Objective:    IM nail tibial                Action/Plan: Spoke to pt and bedside commode was delivered to room. She has RW and grabber at home. Has good family support to assist at home.   Expected Discharge Date:  01/02/18               Expected Discharge Plan:  Home/Self Care  In-House Referral:  NA  Discharge planning Services  CM Consult  Post Acute Care Choice:  Durable Medical Equipment Choice offered to:  Patient  DME Arranged:  3-N-1 DME Agency:  [place]:  NA [place] Agency:  NA  Status of Service:  Completed, signed off  If discussed at [place] of Stay Meetings, dates discussed:    Additional Comments:  Erenest Rasher, RN 01/02/2018, 10:44 AM

## 2018-01-03 NOTE — Anesthesia Postprocedure Evaluation (Signed)
Anesthesia Post Note  Patient: Michele Webster  Procedure(s) Performed: INTRAMEDULLARY (IM) NAIL TIBIAL (Right )     Patient location during evaluation: PACU Anesthesia Type: General Level of consciousness: awake and alert Pain management: pain level controlled Vital Signs Assessment: post-procedure vital signs reviewed and stable Respiratory status: spontaneous breathing, nonlabored ventilation, respiratory function stable and patient connected to nasal cannula oxygen Cardiovascular status: blood pressure returned to baseline and stable Postop Assessment: no apparent nausea or vomiting Anesthetic complications: no    Last Vitals:  Vitals:   01/01/18 2255 01/02/18 0655  BP: 115/72 120/74  Pulse: 83 75  Resp: 19 14  Temp: 36.7 C 36.6 C  SpO2: 100% 99%    Last Pain:  Vitals:   01/02/18 1011  TempSrc:   PainSc: 7                  Barnet Glasgow

## 2018-01-15 NOTE — Discharge Summary (Signed)
Patient ID: Michele Webster MRN: [id number] DOB/AGE: May 23, 1976 42 y.o.  Admit date: 12/31/2017 Discharge date: 01/02/2018  Admission Diagnoses:  Principal Problem:   Tibia/fibula fracture, right, closed, initial encounter   Discharge Diagnoses:  Same  Past Medical History:  Diagnosis Date  . Anxiety   . Bipolar 1 disorder ([place])   . Depression   . High cholesterol   . Substance abuse ([place])     Surgeries: Procedure(s): Right INTRAMEDULLARY (IM) NAIL TIBIAL on 12/31/2017    Discharged Condition: Improved  Hospital Course: Michele Webster is an 42 y.o. female who was admitted 12/31/2017 for operative treatment ofTibia/fibula fracture, right, closed, initial encounter. Patient has severe unremitting pain that affects sleep, daily activities, and work/hobbies. After pre-op clearance the patient was taken to the operating room on 12/31/2017 and underwent  Procedure(s): Right INTRAMEDULLARY (IM) NAIL TIBIAL.    Patient was given perioperative antibiotics:  Anti-infectives (From admission, onward)   Start     Dose/Rate Route Frequency Ordered Stop   12/31/17 2200  clindamycin (CLEOCIN) IVPB 900 mg     900 mg 100 mL/hr over 30 Minutes Intravenous Every 8 hours 12/31/17 1855 01/01/18 1449   12/31/17 1530  ceFAZolin (ANCEF) IVPB 2g/100 mL premix     2 g 200 mL/hr over 30 Minutes Intravenous On call to O.R. 12/31/17 1441 12/31/17 1617       Patient was given sequential compression devices, early ambulation, and chemoprophylaxis to prevent DVT.  Patient underwent physical therapy and on postop day #2 was doing well.  She had progressed with physical therapy.  She was afebrile, her vital signs were stable, and she was taking oral pain medications.  Patient benefited maximally from hospital stay and there were no complications.    Recent vital signs: No data found.   Recent laboratory studies: No results for input(s): WBC, HGB, HCT, PLT, NA, K, CL, CO2, BUN, CREATININE, GLUCOSE,  INR, CALCIUM in the last 72 hours.  Invalid input(s): PT, 2   Discharge Medications:   Allergies as of 01/02/2018      Reactions   Sertraline Other (See Comments)   Makes her crazy   Ceclor [cefaclor] Hives, Nausea And Vomiting, Swelling      Medication List    STOP taking these medications   lidocaine 5 % Commonly known as:  LIDODERM   metroNIDAZOLE 500 MG tablet Commonly known as:  FLAGYL   ramelteon 8 MG tablet Commonly known as:  ROZEREM     TAKE these medications   aspirin EC 325 MG tablet Take 1 tablet (325 mg total) by mouth daily. Take x 1 month post op to decrease risk of blood clots.   FLUoxetine 40 MG capsule Commonly known as:  PROZAC Take 1 capsule (40 mg total) by mouth daily.   hydrOXYzine 25 MG tablet Commonly known as:  ATARAX/VISTARIL Take 1 tablet (25 mg total) by mouth 3 (three) times daily as needed for anxiety.   oxyCODONE-acetaminophen 5-325 MG tablet Commonly known as:  PERCOCET/ROXICET Take 1-2 tablets by mouth every 6 (six) hours as needed for severe pain.   QUEtiapine 100 MG tablet Commonly known as:  SEROQUEL Take 1 tablet (100 mg total) by mouth at bedtime.   tiZANidine 2 MG tablet Commonly known as:  ZANAFLEX Take 1 tablet (2 mg total) by mouth every 8 (eight) hours as needed for muscle spasms.       Diagnostic Studies: Dg Tibia/fibula Right  Result Date: 12/31/2017 CLINICAL DATA:  Right tibial nail  placement FLUOROSCOPY TIME:  2 minutes and 19 seconds. Images: 4 EXAM: RIGHT TIBIA AND FIBULA - 2 VIEW COMPARISON:  None. FINDINGS: An intramedullary nail has been placed across the tibial fracture, affixed proximally and distally with 2 screws. There are 2 screws through the medial malleolus. Fractures through the proximal and distal fibula are identified. IMPRESSION: Hardware placement as above. Electronically Signed   By: Dorise Bullion III M.D   On: 12/31/2017 18:17   Dg Tibia/fibula Right  Result Date: 12/31/2017 CLINICAL DATA:   [place] yesterday.  Lower extremity pain. EXAM: RIGHT TIBIA AND FIBULA - 2 VIEW COMPARISON:  None. FINDINGS: Fracture of the proximal fibular diaphysis with anterior offset of the distal fragment. Spiral fracture of the distal tibial diaphysis with posterior offset of the distal fragment. See results of ankle films in addition. There is also a distal fibular fracture and medial malleolar fracture better shown on the nose images. IMPRESSION: Proximal fibular fracture. Spiral fracture of the distal tibia. See results of ankle and foot exams. Electronically Signed   By: Nelson Chimes M.D.   On: 12/31/2017 12:42   Dg Ankle Complete Right  Result Date: 12/31/2017 CLINICAL DATA:  Status post fall 12/30/2017. EXAM: RIGHT ANKLE - COMPLETE 3+ VIEW COMPARISON:  None. FINDINGS: Oblique comminuted fracture of the distal right tibial diaphysis with 9 mm lateral displacement and 6 mm posterior displacement. Oblique fracture of the distal fibular diaphysis with 3 mm medial displacement. Nondisplaced fracture of medial malleolus. Comminuted fracture at the base of the fifth metatarsal. Small plantar calcaneal spur. IMPRESSION: Oblique comminuted fracture of the distal right tibial diaphysis with 9 mm lateral displacement and 6 mm posterior displacement. Oblique fracture of the distal fibular diaphysis with 3 mm medial displacement. Nondisplaced fracture of medial malleolus. Comminuted fracture at the base of the fifth metatarsal. Electronically Signed   By: Kathreen Devoid   On: 12/31/2017 12:48   Dg Foot Complete Right  Result Date: 12/31/2017 CLINICAL DATA:  [place] last night with lower extremity fractures. EXAM: RIGHT FOOT COMPLETE - 3+ VIEW COMPARISON:  Same day FINDINGS: Comminuted fracture at the base of the fifth metatarsal. Cannot rule out smaller fractures at the base of the third and fourth metatarsals, but that is not definite. Fracture in the region of the foot. See previous studies for tibial and fibular fractures.  IMPRESSION: Comminuted fracture at the base of the fifth metatarsal. Possible fractures of the proximal third and fourth metatarsals. Suggest oblique view when able. Electronically Signed   By: Nelson Chimes M.D.   On: 12/31/2017 12:46   Dg C-arm 1-60 Min-no Report  Result Date: 12/31/2017 Fluoroscopy was utilized by the requesting physician.  No radiographic interpretation.    Disposition: 01-Home or Self Care  Discharge Instructions    Call MD / Call 911   Complete by:  As directed    If you experience chest pain or shortness of breath, CALL 911 and be transported to the hospital emergency room.  If you develope a fever above 101 F, pus (white drainage) or increased drainage or redness at the wound, or calf pain, call your surgeon's office.   Constipation Prevention   Complete by:  As directed    Drink plenty of fluids.  Prune juice may be helpful.  You may use a stool softener, such as Colace (over the counter) 100 mg twice a day.  Use MiraLax (over the counter) for constipation as needed.   Diet - low sodium heart healthy   Complete  by:  As directed    Driving restrictions   Complete by:  As directed    No driving for 6 weeks   Increase activity slowly as tolerated   Complete by:  As directed    Toe touch only      Follow-up Information    Dorna Leitz, MD. Schedule an appointment as soon as possible for a visit in 2 weeks.   Specialty:  Orthopedic Surgery Contact information: [place] [address] [phone]            Signed: Erlene Senters 01/15/2018, 11:44 AM

## 2018-07-26 IMAGING — CR DG TIBIA/FIBULA 2V*R*
2 series · 2 of 2 positions shown · non-contrast
Comparison: None.

CLINICAL DATA: Fell yesterday.  Lower extremity pain.

EXAM:
RIGHT TIBIA AND FIBULA - 2 VIEW

[x tib-fib lat right]
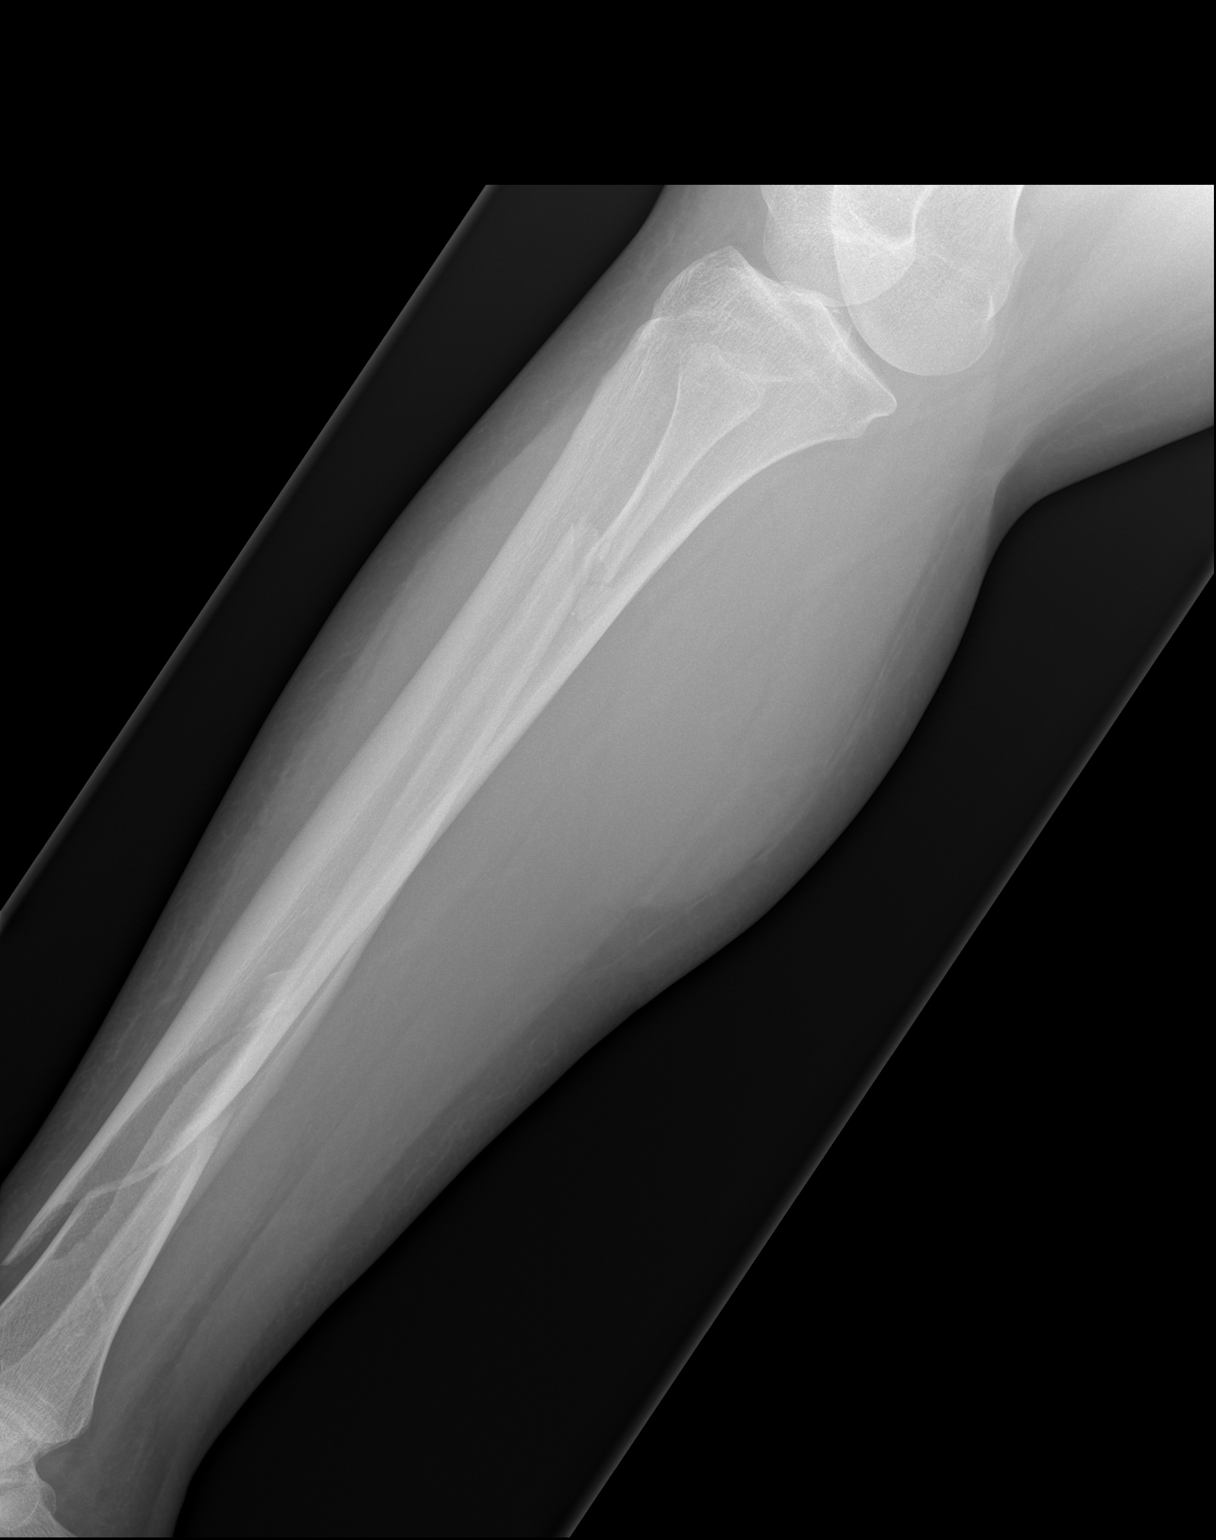

[x tib-fib ap right]
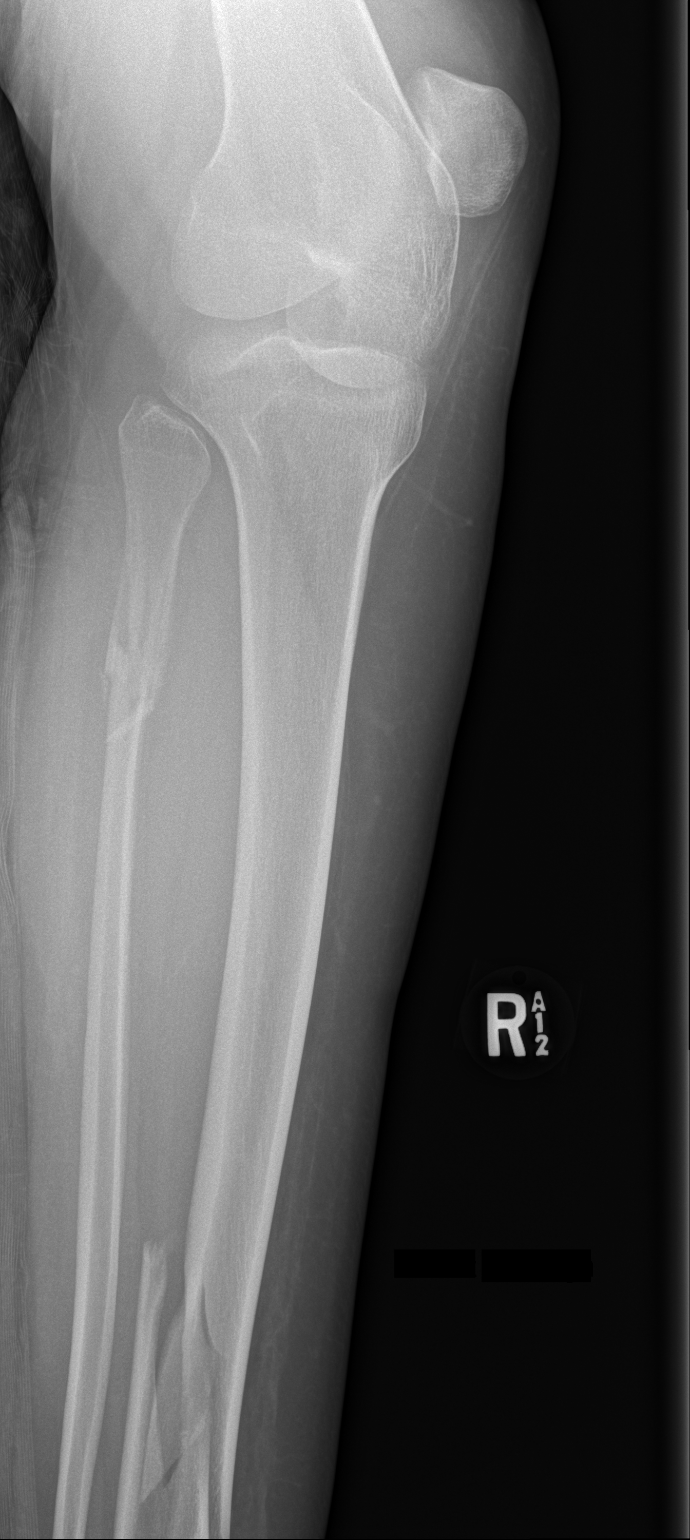

[2 of 2 positions shown; findings below may reference images not displayed]

FINDINGS: Fracture of the proximal fibular diaphysis with anterior offset of
the distal fragment. Spiral fracture of the distal tibial diaphysis
with posterior offset of the distal fragment. See results of ankle
films in addition. There is also a distal fibular fracture and
medial malleolar fracture better shown on the nose images.
IMPRESSION: Proximal fibular fracture. Spiral fracture of the distal tibia. See
results of ankle and foot exams.

## 2018-07-26 IMAGING — CR DG ANKLE COMPLETE 3+V*R*
2 series · 2 of 2 positions shown · non-contrast
Comparison: None.

CLINICAL DATA: Status post fall 12/30/2017.

EXAM:
RIGHT ANKLE - COMPLETE 3+ VIEW

[x ankle lat right]
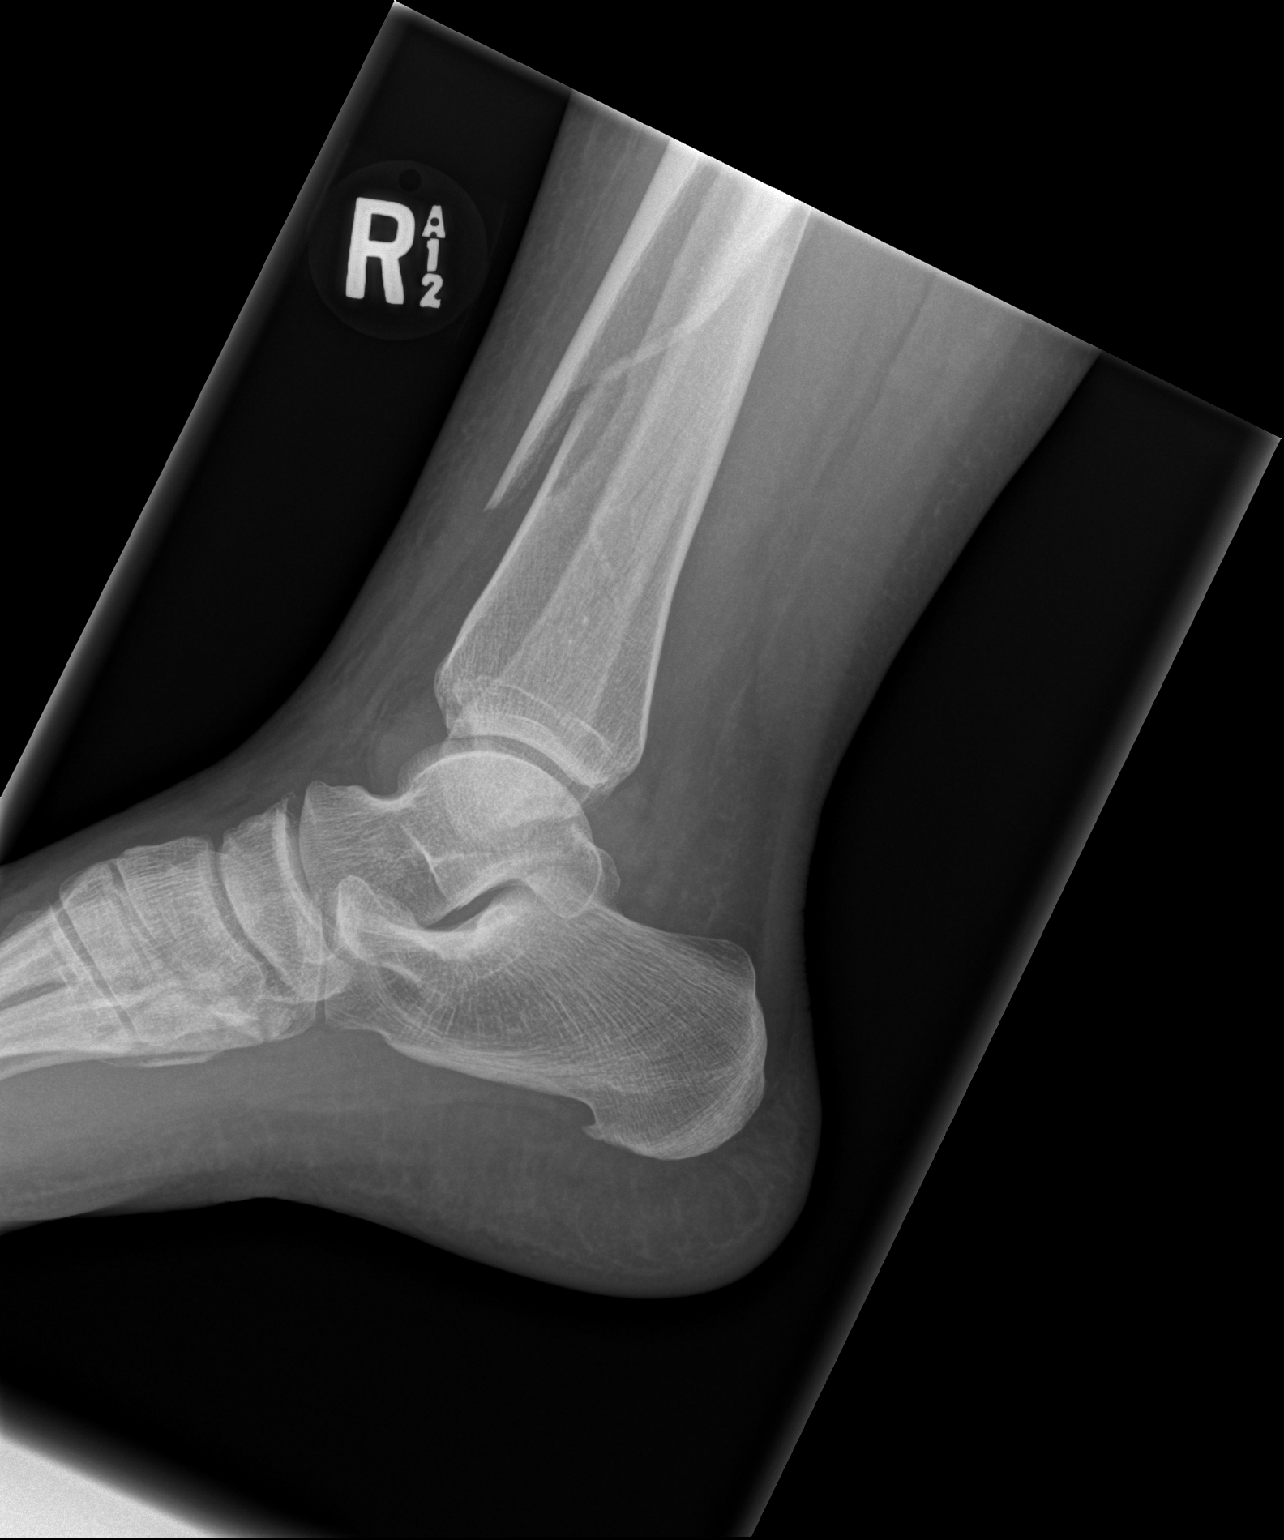

[x ankle ap right]
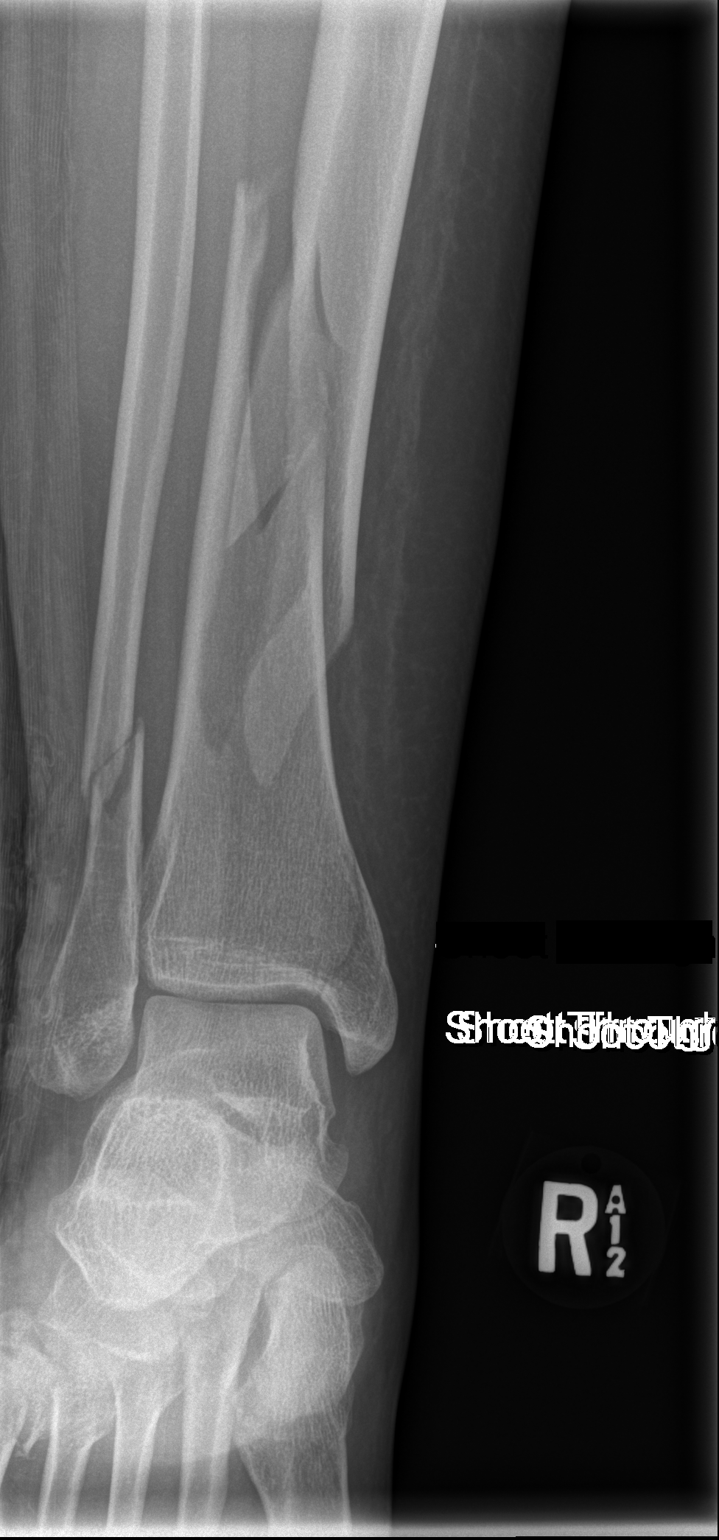

[2 of 2 positions shown; findings below may reference images not displayed]

FINDINGS: Oblique comminuted fracture of the distal right tibial diaphysis
with 9 mm lateral displacement and 6 mm posterior displacement.

Oblique fracture of the distal fibular diaphysis with 3 mm medial
displacement.

Nondisplaced fracture of medial malleolus.

Comminuted fracture at the base of the fifth metatarsal.

Small plantar calcaneal spur.
IMPRESSION: Oblique comminuted fracture of the distal right tibial diaphysis
with 9 mm lateral displacement and 6 mm posterior displacement.

Oblique fracture of the distal fibular diaphysis with 3 mm medial
displacement.

Nondisplaced fracture of medial malleolus.

Comminuted fracture at the base of the fifth metatarsal.

## 2018-09-22 ENCOUNTER — Other Ambulatory Visit: Payer: Self-pay | Admitting: Family Medicine

## 2018-09-22 DIAGNOSIS — Z1231 Encounter for screening mammogram for malignant neoplasm of breast: Secondary | ICD-10-CM

## 2021-03-11 ENCOUNTER — Other Ambulatory Visit: Payer: Self-pay

## 2021-03-11 ENCOUNTER — Encounter (HOSPITAL_COMMUNITY): Payer: Self-pay | Admitting: Licensed Clinical Social Worker

## 2021-03-11 ENCOUNTER — Ambulatory Visit (INDEPENDENT_AMBULATORY_CARE_PROVIDER_SITE_OTHER): Payer: Medicaid Other | Admitting: Licensed Clinical Social Worker

## 2021-03-11 DIAGNOSIS — F411 Generalized anxiety disorder: Secondary | ICD-10-CM | POA: Diagnosis not present

## 2021-03-11 DIAGNOSIS — F3162 Bipolar disorder, current episode mixed, moderate: Secondary | ICD-10-CM | POA: Insufficient documentation

## 2021-03-11 DIAGNOSIS — F5101 Primary insomnia: Secondary | ICD-10-CM | POA: Insufficient documentation
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
## 2021-03-18 ENCOUNTER — Ambulatory Visit (HOSPITAL_COMMUNITY): Payer: Medicaid Other | Admitting: Behavioral Health

## 2021-03-21 ENCOUNTER — Ambulatory Visit (HOSPITAL_COMMUNITY): Payer: Medicaid Other | Admitting: Behavioral Health

## 2021-03-28 LAB — EXTERNAL GENERIC LAB PROCEDURE: COLOGUARD: NEGATIVE

## 2021-04-04 ENCOUNTER — Ambulatory Visit (HOSPITAL_COMMUNITY): Payer: Medicaid Other | Admitting: Behavioral Health

## 2021-04-18 ENCOUNTER — Encounter (HOSPITAL_COMMUNITY): Payer: Self-pay

## 2021-04-18 ENCOUNTER — Emergency Department (HOSPITAL_COMMUNITY)
Admission: EM | Admit: 2021-04-18 | Discharge: 2021-04-19 | Disposition: A | Discharge status: Home / Self Care | Payer: Medicaid Other | Source: Home / Self Care

## 2021-04-18 ENCOUNTER — Ambulatory Visit (HOSPITAL_COMMUNITY)
Admission: RE | Admit: 2021-04-18 | Discharge: 2021-04-18 | Disposition: A | Discharge status: Home / Self Care | Payer: Medicaid Other | Attending: Psychiatry | Admitting: Psychiatry

## 2021-04-18 ENCOUNTER — Other Ambulatory Visit: Payer: Self-pay

## 2021-04-18 DIAGNOSIS — Z7982 Long term (current) use of aspirin: Secondary | ICD-10-CM | POA: Insufficient documentation

## 2021-04-18 DIAGNOSIS — F329 Major depressive disorder, single episode, unspecified: Secondary | ICD-10-CM | POA: Insufficient documentation

## 2021-04-18 DIAGNOSIS — Z20822 Contact with and (suspected) exposure to covid-19: Secondary | ICD-10-CM | POA: Insufficient documentation

## 2021-04-18 DIAGNOSIS — F22 Delusional disorders: Secondary | ICD-10-CM | POA: Insufficient documentation

## 2021-04-18 DIAGNOSIS — R45851 Suicidal ideations: Secondary | ICD-10-CM | POA: Insufficient documentation

## 2021-04-18 DIAGNOSIS — F1490 Cocaine use, unspecified, uncomplicated: Secondary | ICD-10-CM | POA: Insufficient documentation

## 2021-04-18 DIAGNOSIS — F1290 Cannabis use, unspecified, uncomplicated: Secondary | ICD-10-CM | POA: Insufficient documentation

## 2021-04-18 DIAGNOSIS — F1910 Other psychoactive substance abuse, uncomplicated: Secondary | ICD-10-CM

## 2021-04-18 DIAGNOSIS — F17210 Nicotine dependence, cigarettes, uncomplicated: Secondary | ICD-10-CM | POA: Insufficient documentation

## 2021-04-18 DIAGNOSIS — T1491XA Suicide attempt, initial encounter: Secondary | ICD-10-CM

## 2021-04-18 LAB — COMPREHENSIVE METABOLIC PANEL
ALT: 22 U/L (ref 0–44)
AST: 21 U/L (ref 15–41)
Albumin: 4.5 g/dL (ref 3.5–5.0)
Alkaline Phosphatase: 96 U/L (ref 38–126)
Anion gap: 5 (ref 5–15)
BUN: 15 mg/dL (ref 6–20)
CO2: 25 mmol/L (ref 22–32)
Calcium: 9.6 mg/dL (ref 8.9–10.3)
Chloride: 108 mmol/L (ref 98–111)
Creatinine, Ser: 0.82 mg/dL (ref 0.44–1.00)
GFR, Estimated: >60 mL/min (ref >60)
Glucose, Bld: 94 mg/dL (ref 70–99)
Potassium: 3.4 mmol/L — ABNORMAL LOW (ref 3.5–5.1)
Sodium: 138 mmol/L (ref 135–145)
Total Bilirubin: 0.6 mg/dL (ref 0.3–1.2)
Total Protein: 8.1 g/dL (ref 6.5–8.1)

## 2021-04-18 LAB — CBC WITH DIFFERENTIAL/PLATELET
Abs Immature Granulocytes: 0.03 10*3/uL (ref 0.00–0.07)
Basophils Absolute: 0.1 10*3/uL (ref 0.0–0.1)
Basophils Relative: 1 %
Eosinophils Absolute: 0.1 10*3/uL (ref 0.0–0.5)
Eosinophils Relative: 1 %
HCT: 40.6 % (ref 36.0–46.0)
Hemoglobin: 12.9 g/dL (ref 12.0–15.0)
Immature Granulocytes: 0 %
Lymphocytes Relative: 27 %
Lymphs Abs: 2.8 10*3/uL (ref 0.7–4.0)
MCH: 28.1 pg (ref 26.0–34.0)
MCHC: 31.8 g/dL (ref 30.0–36.0)
MCV: 88.5 fL (ref 80.0–100.0)
Monocytes Absolute: 0.6 10*3/uL (ref 0.1–1.0)
Monocytes Relative: 6 %
Neutro Abs: 6.8 10*3/uL (ref 1.7–7.7)
Neutrophils Relative %: 65 %
Platelets: 515 10*3/uL — ABNORMAL HIGH (ref 150–400)
RBC: 4.59 MIL/uL (ref 3.87–5.11)
RDW: 12.7 % (ref 11.5–15.5)
WBC: 10.5 10*3/uL (ref 4.0–10.5)
nRBC: 0 % (ref 0.0–0.2)

## 2021-04-18 LAB — RESP PANEL BY RT-PCR (FLU A&B, COVID) ARPGX2
Influenza A by PCR: NEGATIVE
Influenza B by PCR: NEGATIVE
SARS Coronavirus 2 by RT PCR: NEGATIVE

## 2021-04-18 LAB — RAPID URINE DRUG SCREEN, HOSP PERFORMED
Amphetamines: POSITIVE — AB
Barbiturates: NOT DETECTED
Benzodiazepines: NOT DETECTED
Cocaine: POSITIVE — AB
Opiates: NOT DETECTED
Tetrahydrocannabinol: POSITIVE — AB

## 2021-04-18 LAB — ETHANOL: Alcohol, Ethyl (B): <10 mg/dL (ref <10)

## 2021-04-18 LAB — I-STAT BETA HCG BLOOD, ED (MC, WL, AP ONLY): I-stat hCG, quantitative: <5 m[IU]/mL (ref <5)

## 2021-04-18 LAB — SALICYLATE LEVEL: Salicylate Lvl: <7 mg/dL — ABNORMAL LOW (ref 7.0–30.0)

## 2021-04-18 LAB — ACETAMINOPHEN LEVEL: Acetaminophen (Tylenol), Serum: <10 ug/mL — ABNORMAL LOW (ref 10–30)

## 2021-04-18 MED ORDER — NICOTINE 14 MG/24HR TD PT24
14.0000 mg | MEDICATED_PATCH | Freq: Once | TRANSDERMAL | Status: DC
Start: 2021-04-18 — End: 2021-04-19
  Administered 2021-04-18: 14 mg via TRANSDERMAL
  Filled 2021-04-18: qty 1

## 2021-04-18 MED ORDER — ZIPRASIDONE MESYLATE 20 MG IM SOLR
20.0000 mg | Freq: Once | INTRAMUSCULAR | Status: DC
  Filled 2021-04-18: qty 20

## 2021-04-18 NOTE — ED Notes (Signed)
Patient's mother updated on the patient's status.

## 2021-04-18 NOTE — ED Notes (Signed)
Patient IVC'D by Dr. Louretta Shorten and first exam.

## 2021-04-18 NOTE — ED Provider Notes (Signed)
[facility] DEPT Provider Note   CSN: [id number] Arrival date & time: 04/18/21  1732     History Chief Complaint  Patient presents with  . Psychiatric Evaluation    Michele Webster is a 45 y.o. female with past medical history significant for substance abuse, bipolar, anxiety who presents for evaluation of suicide attempt.  Patient states 2 days ago she took approximately 100 pills of unknown substance in attempt to end her life.  No recent ingestion in last 24 hours of pills. Recently started using meth again, most recent today.  She denies any history of chronic alcohol use.  She denies any chronic medical problems.  Patient is agitated, aggressive.  Has rapid thoughts, pressured speech. Denies recent traumatic injuries.  Level 5 caveat- Psychiatric condition  Patient was placed under IVC by psychiatry prior to arrival.  She arrives with GPD  HPI     Past Medical History:  Diagnosis Date  . Anxiety   . Bipolar 1 disorder ([place])   . Depression   . High cholesterol   . Substance abuse [facility])     Patient Active Problem List   Diagnosis Date Noted  . Bipolar 1 disorder, mixed, moderate ([place]) 03/11/2021  . GAD (generalized anxiety disorder) 03/11/2021  . Primary insomnia 03/11/2021  . Tibia/fibula fracture, right, closed, initial encounter 12/31/2017  . Cocaine use disorder, moderate, dependence ([place]) 11/01/2015  . Cannabis use disorder, moderate, dependence ([place]) 11/01/2015  . Bipolar disorder with current episode depressed ([place]) 10/31/2015  . Suicide attempt ([place])   . Cocaine abuse ([place]) 01/24/2014  . Polysubstance abuse ([place]) 01/24/2014  . Depression 01/24/2014  . Anal fissure 02/05/2012    Past Surgical History:  Procedure Laterality Date  . TIBIA IM NAIL INSERTION Right 12/31/2017   Procedure: INTRAMEDULLARY (IM) NAIL TIBIAL;  Surgeon: Dorna Leitz, MD;  Location: [facility] ORS;  Service: Orthopedics;  Laterality: Right;  . TUBAL LIGATION        OB History   No obstetric history on file.     Family History  Problem Relation Age of Onset  . Cancer Maternal Grandmother        breast    Social History   Tobacco Use  . Smoking status: Current Every Day Smoker    Packs/day: 1.00    Types: Cigarettes  . Smokeless tobacco: Never Used  Substance Use Topics  . Alcohol use: Not Currently    Comment: recovering alchohlic. Currently craving crack cocain has not used in 2 years.   . Drug use: Not Currently    Frequency: 7.0 times per week    Types: Marijuana    Comment: 1 gram every 2 to 3 days.     Home Medications Prior to Admission medications   Medication Sig Start Date End Date Taking? Authorizing Provider  aspirin EC 325 MG tablet Take 1 tablet (325 mg total) by mouth daily. Take x 1 month post op to decrease risk of blood clots. 12/31/17   Gary Fleet, PA-C  FLUoxetine (PROZAC) 40 MG capsule Take 1 capsule (40 mg total) by mouth daily. 11/04/15   Withrow, Elyse Jarvis, FNP  hydrOXYzine (ATARAX/VISTARIL) 25 MG tablet Take 1 tablet (25 mg total) by mouth 3 (three) times daily as needed for anxiety. 11/04/15   Withrow, Elyse Jarvis, FNP  oxyCODONE-acetaminophen (PERCOCET/ROXICET) 5-325 MG tablet Take 1-2 tablets by mouth every 6 (six) hours as needed for severe pain. 12/31/17   Gary Fleet, PA-C  QUEtiapine (SEROQUEL) 100 MG tablet Take  1 tablet (100 mg total) by mouth at bedtime. 11/04/15   Withrow, Elyse Jarvis, FNP  tiZANidine (ZANAFLEX) 2 MG tablet Take 1 tablet (2 mg total) by mouth every 8 (eight) hours as needed for muscle spasms. 12/31/17   Gary Fleet, PA-C    Allergies    Sertraline and Ceclor [cefaclor]  Review of Systems   Review of Systems  Unable to perform ROS: Psychiatric disorder    Physical Exam Updated Vital Signs BP (!) 147/96   Pulse 89   Temp 98 F (36.7 C)   Resp (!) 21   Ht 5' 5"  (1.651 m)   Wt 99 kg   SpO2 99%   BMI 36.32 kg/m   Physical Exam Vitals and nursing note reviewed.   Constitutional:      General: She is not in acute distress.    Appearance: She is well-developed. She is not ill-appearing, toxic-appearing or diaphoretic.  HENT:     Head: Normocephalic and atraumatic. No raccoon eyes.     Comments: No obvious traumatic injuries.  No raccoon eyes Eyes:     Pupils: Pupils are equal, round, and reactive to light.  Cardiovascular:     Rate and Rhythm: Normal rate and regular rhythm.  Pulmonary:     Effort: Pulmonary effort is normal. No respiratory distress.     Comments: Speaks in full sentences without difficulty Abdominal:     General: There is no distension.     Palpations: Abdomen is soft.  Musculoskeletal:        General: Normal range of motion.     Cervical back: Normal range of motion and neck supple.     Comments: Moves all 4 extremities without difficulty  Skin:    General: Skin is warm and dry.  Neurological:     Mental Status: She is alert.  Psychiatric:        Attention and Perception: She is inattentive.        Mood and Affect: Mood is elated. Affect is angry.        Speech: Speech is rapid and pressured.        Behavior: Behavior is agitated, aggressive and hyperactive.        Thought Content: Thought content is paranoid and delusional. Thought content includes suicidal ideation. Thought content includes suicidal plan.     Comments: Rapid, pressured speech.  Patient is agitated, aggressive.  Admits to plan of suicide attempt with attempt to overdose.     ED Results / Procedures / Treatments   Labs (all labs ordered are listed, but only abnormal results are displayed) Labs Reviewed  COMPREHENSIVE METABOLIC PANEL - Abnormal; Notable for the following components:      Result Value   Potassium 3.4 (*)    All other components within normal limits  CBC WITH DIFFERENTIAL/PLATELET - Abnormal; Notable for the following components:   Platelets 515 (*)    All other components within normal limits  SALICYLATE LEVEL - Abnormal; Notable  for the following components:   Salicylate Lvl <9.6 (*)    All other components within normal limits  ACETAMINOPHEN LEVEL - Abnormal; Notable for the following components:   Acetaminophen (Tylenol), Serum <10 (*)    All other components within normal limits  RAPID URINE DRUG SCREEN, HOSP PERFORMED - Abnormal; Notable for the following components:   Cocaine POSITIVE (*)    Amphetamines POSITIVE (*)    Tetrahydrocannabinol POSITIVE (*)    All other components within normal limits  RESP PANEL  BY RT-PCR (FLU A&B, COVID) ARPGX2  ETHANOL  I-STAT BETA HCG BLOOD, ED ([facility], [facility], [facility] ONLY)    EKG None  Radiology No results found.  Procedures Procedures   Medications Ordered in ED Medications  ziprasidone (GEODON) injection 20 mg (has no administration in time range)    ED Course  I have reviewed the triage vital signs and the nursing notes.  Pertinent labs & imaging results that were available during my care of the patient were reviewed by me and considered in my medical decision making (see chart for details).  Patient here under IVC for suicidal ideation.  Attempted 2 days to take her life by swallowing approximately 100 pills of unknown substance.  She denies any recent traumatic injuries.  Started using meth 3 days ago.  Patient has rapid, pressured speech.  She is agitated, aggressive.  She admits to suicide attempt with a plan to overdose.  Was seen at behavioral health who placed patient under IVC.  She was sent here for medical clearance due to recent ingestion.  Labs personally reviewed and interpreted:  Labs without significant abnormality UDS positive for cocaine, amphetamines and THC  Patient medically cleared.  Disposition per psychiatry.  Psych hold orders placed  Patient is under IVC    MDM Rules/Calculators/A&P                           Final Clinical Impression(s) / ED Diagnoses Final diagnoses:  Polysubstance abuse ([place])  Suicide attempt [facility])  Suicidal  ideation    Rx / DC Orders ED Discharge Orders    None       Ronzell Laban A, PA-C 04/18/21 2042    Little, Wenda Overland, MD 04/18/21 2241

## 2021-04-18 NOTE — BH Assessment (Signed)
Patient presented to [facility] as a walk-in brought by her mother. Clinician attempted to complete her assessment. When asked what brought her to [facility]. She states, "I tried to kill myself 2 days ago". States, on (Wednesday, 04/16/21) she overdosed. She doesn't know exactly what type of pills she consumed. However, indicated that it was #4 different type prescription pills that belonged to someone she knows. She did not identify whom the prescription belonged. However, stated, "I took 100 pills...Marland Kitchenhell maybe it was more than that". Patient went to sleep after the overdose and woke up yesterday. States that she still feels sick. She describes her current symptoms as "I'm hot as hell, I'm hot".   Patient also reporting that she relapsed/binged on cocaine and methamphetamine 2 days ago after 2 years of sobriety. Patient is observantly agitated and restless. Requesting to go out and smoke a cigarette.   Due to concern of medical complications from overdosing, patient recommended for medical clearance by Thomes Lolling, NP. She was reluctant to cooperating with recommendations. Therefore, IVC'd by Dr. Louretta Shorten.   The [facility] provider Thomes Lolling, NP) recommends medical clearance at this time. In the event that she is medically cleared a TTS order will need to be place in order to complete her assessment and disposition her accordingly.   EDP (Dr. Carleene Overlie), Charge Nurse Catalina Antigua, RN), and Disposition Counselor (Disposition Counselor) all provided details regarding this patient.

## 2021-04-18 NOTE — ED Triage Notes (Signed)
[facility] was at [facility] and sent her due to admitting to consuming Meth in attempt of overdosing. Patient has IVC paperwork. Hx bipolar

## 2021-04-18 NOTE — H&P (Addendum)
Behavioral Health Medical Screening Exam  Michele Webster is a 45 y.o. female.  Total Time spent with patient: 20 minutes patient presented to [facility] as a walk in accompanied by her mother  with complaints of  "I tried to kill my self two days ago".  Michele Webster, 45 y.o., female patient seen face to face by this provider, consulted with Dr. Dwyane Dee: and chart reviewed on 04/18/21.      During evaluation Michele Webster is in sitting position with her hands on her head I, she is sweating.  She is alert, oriented x 4, uncooperative. Her mood is agitated and angry with congruent affect.  She is yelling, speech is loud but clear. Reports that she has not eaten She becomes agitated when she is told she will have to be seen in the ED. She starts pacing in the assessment room. She pulled the STARR alarm. Discussed with patient rational for being seen in the ED related to her overdose.   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  Reports that 3 days ago she started using meth again.  States that two days ago she overdosed with 100 pills to try and end her life.  States it was for different kinds of pills she does not know the names.  Reports the pills were someone else's prescriptions.  States that she passed out and woke up today.  Reports that she has felt very sick.  Patient will be transported to the [facility] long ED for medical clearance.  Patient refused to be transported safe transport or EMS to [facility]. States, " I am a grown woman and I walked in here and I will walk out. Due to safety concerns that patient would not follow up with the ED for medical clearance. Case consulted with Dr. Valma Cava and patient was IVC.  Charge Nurse [facility] notified.  Provided report to Dr. Langston Masker of patients disposition.   Psychiatric Specialty Exam:  Presentation  General Appearance: Disheveled  Eye Contact:Minimal  Speech:Clear and Coherent;  Pressured  Speech Volume:Increased  Handedness:Right   Mood and Affect  Mood:Anxious; Irritable  Affect:Congruent   Thought Process  Thought Processes:Coherent  Descriptions of Associations:Intact  Orientation:Full (Time, Place and Person)  Thought Content:Illogical  History of Schizophrenia/Schizoaffective disorder:No  Duration of Psychotic Symptoms:No data recorded Hallucinations:Hallucinations: None  Ideas of Reference:None  Suicidal Thoughts:Suicidal Thoughts: Yes, Active SI Active Intent and/or Plan: With Intent; With Plan  Homicidal Thoughts:Homicidal Thoughts: No   Sensorium  Memory:Immediate Good; Recent Good; Remote Good  Judgment:Poor  Insight:Poor   Executive Functions  Concentration:Fair  Attention Span:Fair  [place]; Good   Psychomotor Activity  Psychomotor Activity:Psychomotor Activity: Restlessness; Increased   Assets  Assets:Desire for Improvement; Resilience; Social Support   Sleep  Sleep:Sleep: Poor Number of Hours of Sleep: 2    Physical Exam: Physical Exam Vitals reviewed.  Constitutional:      Appearance: She is ill-appearing.  HENT:     Head: Normocephalic.     Right Ear: Tympanic membrane normal.     Left Ear: Tympanic membrane normal.     Nose: Nose normal.     Mouth/Throat:     Mouth: Mucous membranes are dry.  Eyes:     Conjunctiva/sclera: Conjunctivae normal.  Cardiovascular:     Rate and Rhythm: Normal rate.  Pulmonary:     Effort: Pulmonary effort is normal.  Musculoskeletal:  General: Normal range of motion.     Cervical back: Normal range of motion.  Skin:    Coloration: Skin is pale.  Neurological:     Mental Status: She is alert and oriented to person, place, and time.  Psychiatric:        Attention and Perception: Attention and perception normal.        Mood and Affect: Mood is anxious. Affect is angry.        Speech: Speech is rapid and  pressured.        Behavior: Behavior is agitated and aggressive.        Thought Content: Thought content includes suicidal ideation. Thought content includes suicidal plan.        Cognition and Memory: Cognition normal.        Judgment: Judgment is impulsive.    Review of Systems  Unable to perform ROS: Medical condition  Patient refused to answer  Blood pressure (!) 156/100, pulse 97, temperature 98.3 F (36.8 C), temperature source Oral, resp. rate 18, SpO2 100 %. There is no height or weight on file to calculate BMI.  Musculoskeletal: Strength & Muscle Tone: within normal limits Gait & Station: normal Patient leans: N/A   Recommendations:  Based on my evaluation the patient appears to have an emergency medical condition for which I recommend the patient be transferred to the emergency department for further evaluation.  Revonda Humphrey, NP 04/18/2021, 3:41 PM

## 2021-04-19 ENCOUNTER — Inpatient Hospital Stay (HOSPITAL_COMMUNITY)
Admission: AD | Admit: 2021-04-19 | Discharge: 2021-04-21 | DRG: 885 | Disposition: A | Discharge status: Home / Self Care | Payer: Medicaid Other | Source: Intra-hospital | Attending: Behavioral Health | Admitting: Behavioral Health

## 2021-04-19 ENCOUNTER — Encounter (HOSPITAL_COMMUNITY): Payer: Self-pay | Admitting: Psychiatry

## 2021-04-19 DIAGNOSIS — Z62810 Personal history of physical and sexual abuse in childhood: Secondary | ICD-10-CM | POA: Diagnosis present

## 2021-04-19 DIAGNOSIS — F329 Major depressive disorder, single episode, unspecified: Secondary | ICD-10-CM | POA: Diagnosis present

## 2021-04-19 DIAGNOSIS — F1290 Cannabis use, unspecified, uncomplicated: Secondary | ICD-10-CM | POA: Diagnosis present

## 2021-04-19 DIAGNOSIS — Z20822 Contact with and (suspected) exposure to covid-19: Secondary | ICD-10-CM | POA: Diagnosis present

## 2021-04-19 DIAGNOSIS — F1510 Other stimulant abuse, uncomplicated: Secondary | ICD-10-CM | POA: Diagnosis present

## 2021-04-19 DIAGNOSIS — R45851 Suicidal ideations: Secondary | ICD-10-CM | POA: Diagnosis present

## 2021-04-19 DIAGNOSIS — Z9151 Personal history of suicidal behavior: Secondary | ICD-10-CM | POA: Diagnosis not present

## 2021-04-19 DIAGNOSIS — F17210 Nicotine dependence, cigarettes, uncomplicated: Secondary | ICD-10-CM | POA: Diagnosis present

## 2021-04-19 DIAGNOSIS — F3181 Bipolar II disorder: Secondary | ICD-10-CM | POA: Diagnosis present

## 2021-04-19 DIAGNOSIS — F411 Generalized anxiety disorder: Secondary | ICD-10-CM | POA: Diagnosis present

## 2021-04-19 DIAGNOSIS — F1420 Cocaine dependence, uncomplicated: Secondary | ICD-10-CM | POA: Diagnosis present

## 2021-04-19 DIAGNOSIS — F1910 Other psychoactive substance abuse, uncomplicated: Secondary | ICD-10-CM | POA: Diagnosis present

## 2021-04-19 DIAGNOSIS — Z881 Allergy status to other antibiotic agents status: Secondary | ICD-10-CM | POA: Diagnosis not present

## 2021-04-19 HISTORY — DX: Bipolar II disorder: F31.81

## 2021-04-19 LAB — LIPID PANEL
Cholesterol: 170 mg/dL (ref 0–200)
HDL: 43 mg/dL (ref >40)
LDL Cholesterol: 92 mg/dL (ref 0–99)
Total CHOL/HDL Ratio: 4 RATIO
Triglycerides: 176 mg/dL — ABNORMAL HIGH (ref <150)
VLDL: 35 mg/dL (ref 0–40)

## 2021-04-19 LAB — TSH: TSH: 1.278 u[IU]/mL (ref 0.350–4.500)

## 2021-04-19 LAB — HEMOGLOBIN A1C
Hgb A1c MFr Bld: 5.7 % — ABNORMAL HIGH (ref 4.8–5.6)
Mean Plasma Glucose: 116.89 mg/dL

## 2021-04-19 MED ORDER — STERILE WATER FOR INJECTION IJ SOLN
INTRAMUSCULAR | Status: AC
  Filled 2021-04-19: qty 10

## 2021-04-19 MED ORDER — ACETAMINOPHEN 500 MG PO TABS
1000.0000 mg | ORAL_TABLET | Freq: Once | ORAL | Status: AC
  Administered 2021-04-19: 1000 mg via ORAL
  Filled 2021-04-19: qty 2

## 2021-04-19 MED ORDER — QUETIAPINE FUMARATE 25 MG PO TABS
25.0000 mg | ORAL_TABLET | Freq: Once | ORAL | Status: AC
  Administered 2021-04-19: 25 mg via ORAL
  Filled 2021-04-19 (×2): qty 1

## 2021-04-19 MED ORDER — QUETIAPINE FUMARATE 100 MG PO TABS
100.0000 mg | ORAL_TABLET | Freq: Every day | ORAL | Status: DC
  Filled 2021-04-19 (×3): qty 1

## 2021-04-19 MED ORDER — NICOTINE 21 MG/24HR TD PT24
21.0000 mg | MEDICATED_PATCH | Freq: Every day | TRANSDERMAL | Status: DC
  Administered 2021-04-19 – 2021-04-21 (×3): 21 mg via TRANSDERMAL
  Filled 2021-04-19 (×5): qty 1

## 2021-04-19 MED ORDER — ZIPRASIDONE MESYLATE 20 MG IM SOLR: 20.0000 mg | INTRAMUSCULAR | Status: DC | PRN

## 2021-04-19 MED ORDER — OLANZAPINE 5 MG PO TBDP: 5.0000 mg | ORAL_TABLET | Freq: Three times a day (TID) | ORAL | Status: DC | PRN

## 2021-04-19 MED ORDER — MAGNESIUM HYDROXIDE 400 MG/5ML PO SUSP: 30.0000 mL | Freq: Every day | ORAL | Status: DC | PRN

## 2021-04-19 MED ORDER — ALUM & MAG HYDROXIDE-SIMETH 200-200-20 MG/5ML PO SUSP: 30.0000 mL | ORAL | Status: DC | PRN

## 2021-04-19 MED ORDER — HYDROXYZINE HCL 25 MG PO TABS
25.0000 mg | ORAL_TABLET | Freq: Three times a day (TID) | ORAL | Status: DC | PRN
  Administered 2021-04-19 – 2021-04-21 (×5): 25 mg via ORAL
  Filled 2021-04-19 (×3): qty 1
  Filled 2021-04-19: qty 10
  Filled 2021-04-19 (×2): qty 1

## 2021-04-19 MED ORDER — ACETAMINOPHEN 325 MG PO TABS
650.0000 mg | ORAL_TABLET | Freq: Four times a day (QID) | ORAL | Status: DC | PRN
  Administered 2021-04-19 – 2021-04-20 (×2): 650 mg via ORAL
  Filled 2021-04-19 (×2): qty 2

## 2021-04-19 MED ORDER — NICOTINE 14 MG/24HR TD PT24: 14.0000 mg | MEDICATED_PATCH | Freq: Every day | TRANSDERMAL | Status: DC

## 2021-04-19 MED ORDER — LORAZEPAM 1 MG PO TABS: 1.0000 mg | ORAL_TABLET | ORAL | Status: DC | PRN

## 2021-04-19 MED ORDER — LORAZEPAM 1 MG PO TABS
2.0000 mg | ORAL_TABLET | Freq: Once | ORAL | Status: AC
  Administered 2021-04-19: 2 mg via ORAL
  Filled 2021-04-19: qty 2
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
## 2021-04-19 NOTE — BHH Group Notes (Signed)
Type of Therapy and Topic: Group Therapy: Overcoming Obstacles  Participation Level: Did Not Attend  Description of Group:  In this group patients will watch a Ted Talk titled, "Why We Should All Try Therapy". In this video, patients explore the benefits of going to therapy and how this choice may effect their life. This video also explores other emotions that may arise by attending therapy. Patients were also given a worksheet titled "Therapy Goals" that allows patients to explore their goals for therapy and how their life may be different once they have completed therapy.    Therapeutic Goals:  1. Patient will identify personal and current emotions as they relate to therapy.  2. Patient will identify barriers that currently interfere with them beginning therapy.  3. Patient will identify two goals they are willing to set for therapy.     Summary of Patient Progress: Did not Attend  Michele Webster, [place] Worker [employer]

## 2021-04-19 NOTE — Plan of Care (Signed)
Nurse discussed anxiety and coping skills with patient. 

## 2021-04-19 NOTE — Progress Notes (Signed)
Patient has been in bed most of the day.  SI, contracts for safety.  Denied HI.  Denied A/V hallucinations. EKG completed per MD order. Emotional support and encouragement given patient. Safety maintained with 15 minute checks.
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
## 2021-04-19 NOTE — Progress Notes (Signed)
Patient ID: SHEVY YANEY, female   DOB: [birth year], 45 y.o.   MRN: [id number] Admission Note  Pt is a 45 yo female that presents IVC'd on 04/19/2021 with worsening anxiety, depression, substance use/abuse, and an intentional overdose. Pt states they came in for help and then they were IVC'd. "I came in voluntarily. Why can't I leave?!" Pt was educated on the IVC process with no evidence of learning. Pt is minimal and agitated on admission, stating they just wanted to sleep. Pt states they haven't slept well in the last 4 months. Pt endorses chronic pain in their right leg with "screws and rods" placed after an accident in their kitchen. Pt wouldn't elaborate. Pt is a ppd smoker. Pt relapsed on meth, cocaine, and cannabis. Pt states they have been alcohol free since 2014. Pt denies Rx abuse. Pt states they live with mother and children and plans to return. Pt denies current si/hi/ah/vh and verbally agrees to approach staff if these become apparent and/or before harming self/others while at [facility]. Pt's vital were elevated on admission. Pt states because they were agitated. Pt is asking for discharge. Education provided. Pt safe on the unit. Q90msafety checks implemented and continued. Consents signed, handbook detailing the patient's rights, responsibilities, and visitor guidelines provided. Skin/belongings search completed and patient oriented to unit. Patient stable at this time. Patient given the opportunity to express concerns and ask questions. Patient given toiletries. Will continue to monitor.

## 2021-04-19 NOTE — Progress Notes (Signed)
Recreation Therapy Notes  Date: 04/19/2021 Time: 900a  Topic: Stress Management   Goal Area(s) Addresses:  Patient will review and complete packet supporting identification of stressors and and techniques to combat compounding stress.   Intervention: Independent Workbook   Education: Stress, Time Management, Coping Skills, Lifestyle Changes, Discharge Planning   Comments: LRT provided pt a workbook reviewing stress management concepts and offering an opportunity to create a plan to address stressors. Pt given the option to complete the packet in the dayroom with RN/MHT staff and peers or at their own pace in their room.   Nunzio Cory Lakynn Halvorsen, LRT/CTRS

## 2021-04-19 NOTE — Tx Team (Signed)
Interdisciplinary Treatment and Diagnostic Plan Update  04/19/2021 Time of Session:  Michele Webster MRN: [id number]  Principal Diagnosis: <principal problem not specified>  Secondary Diagnoses: Active Problems:   MDD (major depressive disorder)   Current Medications:  Current Facility-Administered Medications  Medication Dose Route Frequency Provider Last Rate Last Admin  . acetaminophen (TYLENOL) tablet 650 mg  650 mg Oral Q6H PRN White, Patrice L, NP   650 mg at 04/19/21 1056  . alum & mag hydroxide-simeth (MAALOX/MYLANTA) 200-200-20 MG/5ML suspension 30 mL  30 mL Oral Q4H PRN White, Patrice L, NP      . hydrOXYzine (ATARAX/VISTARIL) tablet 25 mg  25 mg Oral TID PRN White, Patrice L, NP   25 mg at 04/19/21 1056  . OLANZapine zydis (ZYPREXA) disintegrating tablet 5 mg  5 mg Oral Q8H PRN White, Patrice L, NP       And  . LORazepam (ATIVAN) tablet 1 mg  1 mg Oral PRN White, Patrice L, NP       And  . ziprasidone (GEODON) injection 20 mg  20 mg Intramuscular PRN White, Patrice L, NP      . magnesium hydroxide (MILK OF MAGNESIA) suspension 30 mL  30 mL Oral Daily PRN White, Patrice L, NP      . nicotine (NICODERM CQ - dosed in mg/24 hours) patch 21 mg  21 mg Transdermal Daily Sharma Covert, MD   21 mg at 04/19/21 1056   PTA Medications: No medications prior to admission.    Patient Stressors: Financial difficulties Health problems Medication change or noncompliance Substance abuse  Patient Strengths: Ability for insight Agricultural engineer for treatment/growth Physical Health Supportive family/friends  Treatment Modalities: Medication Management, Group therapy, Case management,  1 to 1 session with clinician, Psychoeducation, Recreational therapy.   Physician Treatment Plan for Primary Diagnosis: <principal problem not specified> Long Term Goal(s):     Short Term Goals:    Medication Management: Evaluate patient's response, side effects, and  tolerance of medication regimen.  Therapeutic Interventions: 1 to 1 sessions, Unit Group sessions and Medication administration.  Evaluation of Outcomes: Not Met  Physician Treatment Plan for Secondary Diagnosis: Active Problems:   MDD (major depressive disorder)  Long Term Goal(s):     Short Term Goals:       Medication Management: Evaluate patient's response, side effects, and tolerance of medication regimen.  Therapeutic Interventions: 1 to 1 sessions, Unit Group sessions and Medication administration.  Evaluation of Outcomes: Not Met   RN Treatment Plan for Primary Diagnosis: <principal problem not specified> Long Term Goal(s): Knowledge of disease and therapeutic regimen to maintain health will improve  Short Term Goals: Ability to demonstrate self-control, Ability to participate in decision making will improve and Ability to identify and develop effective coping behaviors will improve  Medication Management: RN will administer medications as ordered by provider, will assess and evaluate patient's response and provide education to patient for prescribed medication. RN will report any adverse and/or side effects to prescribing provider.  Therapeutic Interventions: 1 on 1 counseling sessions, Psychoeducation, Medication administration, Evaluate responses to treatment, Monitor vital signs and CBGs as ordered, Perform/monitor CIWA, COWS, AIMS and Fall Risk screenings as ordered, Perform wound care treatments as ordered.  Evaluation of Outcomes: Not Met   LCSW Treatment Plan for Primary Diagnosis: <principal problem not specified> Long Term Goal(s): Safe transition to appropriate next level of care at discharge, Engage patient in therapeutic group addressing interpersonal concerns.  Short Term Goals: Engage patient in  aftercare planning with referrals and resources, Increase social support and Increase ability to appropriately verbalize feelings  Therapeutic Interventions: Assess  for all discharge needs, 1 to 1 time with Social worker, Explore available resources and support systems, Assess for adequacy in community support network, Educate family and significant other(s) on suicide prevention, Complete Psychosocial Assessment, Interpersonal group therapy.  Evaluation of Outcomes: Not Met   Progress in Treatment: Attending groups: No. and As evidenced by:  recently admitted Participating in groups: No. and As evidenced by:  recently admitted Taking medication as prescribed: Yes. Toleration medication: Yes. Family/Significant other contact made: No, will contact:  CSW will obtain consent Patient understands diagnosis: No. Discussing patient identified problems/goals with staff: Yes. Medical problems stabilized or resolved: Yes. Denies suicidal/homicidal ideation: Yes. Issues/concerns per patient self-inventory: No. Other: None  New problem(s) identified: No, Describe:  None  New Short Term/Long Term Goal(s):medication stabilization, elimination of SI thoughts, development of comprehensive mental wellness plan.  Patient Goals:  "get back on my medications."  Discharge Plan or Barriers:Patient recently admitted. CSW will continue to follow and assess for appropriate referrals and possible discharge planning.   Reason for Continuation of Hospitalization: Depression Medication stabilization Suicidal ideation  Estimated Length of Stay: 3-5 days  Attendees: Patient: Michele Webster 04/19/2021   Physician: Ethelene Browns, MD 04/19/2021   Nursing:  04/19/2021   RN Care Manager: 04/19/2021   Social Worker: Toney Reil, [place] 04/19/2021   Recreational Therapist:  04/19/2021   Other:  04/19/2021   Other:  04/19/2021   Other: 04/19/2021     Scribe for Treatment Team: Mliss Fritz, Latanya Presser 04/19/2021 11:38 AM

## 2021-04-19 NOTE — BHH Group Notes (Signed)
Pt did not attend morning goals group.

## 2021-04-19 NOTE — Tx Team (Signed)
Initial Treatment Plan 04/19/2021 6:30 AM Michele Webster ZDG:[id number]    PATIENT STRESSORS: Financial difficulties Health problems Medication change or noncompliance Substance abuse   PATIENT STRENGTHS: Ability for insight Communication skills Motivation for treatment/growth Physical Health Supportive family/friends   PATIENT IDENTIFIED PROBLEMS: Intentional overdose  anxiety  depression  agitation  relapse             DISCHARGE CRITERIA:  Ability to meet basic life and health needs Improved stabilization in mood, thinking, and/or behavior Motivation to continue treatment in a less acute level of care Need for constant or close observation no longer present  PRELIMINARY DISCHARGE PLAN: Attend 12-step recovery group Outpatient therapy Return to previous living arrangement Return to previous work or school arrangements  PATIENT/FAMILY INVOLVEMENT: This treatment plan has been presented to and reviewed with the patient, Michele Webster.  The patient and family have been given the opportunity to ask questions and make suggestions.  Baron Sane, RN 04/19/2021, 6:30 AM

## 2021-04-19 NOTE — ED Notes (Signed)
[employer] dispatched for transport to take pt to [facility].

## 2021-04-19 NOTE — BH Assessment (Addendum)
Disposition Michele Angel, NP, patient meets inpatient criteria.  Michele Webster, accepted to [facility]  Room 300-1 Attending is Dr. Mallie Darting. Arrival time is now. Report [phone] Kae Heller, RN, informed of acceptance.

## 2021-04-20 DIAGNOSIS — F3181 Bipolar II disorder: Principal | ICD-10-CM

## 2021-04-20 DIAGNOSIS — F411 Generalized anxiety disorder: Secondary | ICD-10-CM

## 2021-04-20 DIAGNOSIS — F1420 Cocaine dependence, uncomplicated: Secondary | ICD-10-CM

## 2021-04-20 MED ORDER — QUETIAPINE FUMARATE 200 MG PO TABS
200.0000 mg | ORAL_TABLET | Freq: Every day | ORAL | Status: DC
  Administered 2021-04-20: 200 mg via ORAL
  Filled 2021-04-20 (×2): qty 1
  Filled 2021-04-20: qty 7
  Filled 2021-04-20: qty 1

## 2021-04-20 MED ORDER — FLUOXETINE HCL 10 MG PO CAPS
10.0000 mg | ORAL_CAPSULE | Freq: Every day | ORAL | Status: DC
  Administered 2021-04-20 – 2021-04-21 (×2): 10 mg via ORAL
  Filled 2021-04-20: qty 7
  Filled 2021-04-20 (×4): qty 1

## 2021-04-20 NOTE — Progress Notes (Signed)
   [facility] Group Notes:  (Nursing/MHT/Case Management/Adjunct)  Date:  04/20/2021  Time:  10:45 PM  Type of Therapy:  Group Therapy  Participation Level:  Active  Participation Quality:  Attentive  Affect:  Appropriate  Cognitive:  Appropriate  Insight:  Appropriate  Engagement in Group:  Engaged  Modes of Intervention:  Discussion  Summary of Progress/Problems:  Michele Webster 04/20/2021, 10:45 PM

## 2021-04-20 NOTE — Progress Notes (Signed)
Michele Webster Progress Note  04/20/2021 11:56 AM Michele Webster  MRN:  [id number]  Subjective: Michele Webster reports, "I feel okay today now that I'm back on some of my medicines. But you guys have not started me on my Fluoxetine yet. I took Fluoxetine, Seroquel 400 mg, Vistaril & another medicine that starts with a V, I was doing really well on these medicines until I ran out of them about one year ago. The Seroquel I'm on here is way too low, but my body can tell that I re-started back on some of the medicines. I slept well last night".  Objective: Michele Webster a 45 year old female with a past history of substance abuse (methamphetamine, cocaine, cannabis, alcohol), bipolar disorder type II versus major depressive disorder, anxiety who presented to [facility] a walk-in with a chief complaint of "I tried to kill myself several days ago." On initial presentation to [facility] 04/18/2021, the patient was allowed, diaphoretic, agitated, angry, pacing, pressured and reported an attempt to kill herself by overdose on pills several days prior to presentation. She stated that she had taken 100 pills of medications but was unable to name the medications.  Daily notes: Michele Webster is seen, chart reviewed. The chart findings discussed with the treatment team. She presents alert, oriented & aware of situation. She is visible on the unit, attending group sessions. She says she is in the hospital because she attempted to overdose on medications, but not to kill herself, but as a cry for help. She says she has not been on mental health medications for her Bipolar depression in over 12 months. She says although she has tried to get back on those medicines, no one would help her. She says she will call psychiatric clinics to make appointment to be seen, but no one will answer her phone calls or return her calls. Michele Webster says she struggled so hard trying to deal with her symptoms until she could not take it any more & took the overdose.  She reports feeling okay today. She says she needs to get back on her Prozac & her Seroquel needs to by increased some because she does well on a higher dose. She is also asking when she could be discharged as she needs to go home to her children. She is explained that we had just re-started her on medications, we are monitoring her to assure she is doing well on them without side effects. Michele Webster currently denies any SIHI, AVH, delusional thoughts or paranoia. She is started on Fluoxetine 10 mg & increased Seroquel from 100 mg to 200 mg po Q hs. Michele Webster is in agreement to the new addition of Fluoxetine & dose adjustment to her Seroquel. She denies any new issues or concerns. Vital signs are stable: B/p 127/87, P 85, R 18 & T 98.7.  Principal Problem: Bipolar 2 disorder (HCC)  Diagnosis: Principal Problem:   Bipolar 2 disorder (HCC) Active Problems:   Cocaine use disorder, moderate, dependence (HCC)   GAD (generalized anxiety disorder)   Polysubstance abuse ([place])   MDD (major depressive disorder)  Total Time spent with patient: 25 minutes  Past Psychiatric History: See H&P  Past Medical History:  Past Medical History:  Diagnosis Date  . Anxiety   . Bipolar 1 disorder ([place])   . Bipolar 2 disorder ([place]) 04/19/2021  . Depression   . High cholesterol   . Substance abuse [facility])     Past Surgical History:  Procedure Laterality Date  . TIBIA IM NAIL INSERTION Right  12/31/2017   Procedure: INTRAMEDULLARY (IM) NAIL TIBIAL;  Surgeon: Dorna Leitz, Webster;  Location: [facility];  Service: Orthopedics;  Laterality: Right;  . TUBAL LIGATION     Family History:  Family History  Problem Relation Age of Onset  . Cancer Maternal Grandmother        breast   Family Psychiatric  History: See H&P  Social History:  Social History   Substance and Sexual Activity  Alcohol Use Not Currently   Comment: recovering alchohlic. Currently craving crack cocain has not used in 2 years.      Social History    Substance and Sexual Activity  Drug Use Yes  . Frequency: 7.0 times per week  . Types: Marijuana, Cocaine, Methamphetamines   Comment: 1 gram every 2 to 3 days.     Social History   Socioeconomic History  . Marital status: Divorced    Spouse name: Not on file  . Number of children: Not on file  . Years of education: Not on file  . Highest education level: Not on file  Occupational History  . Not on file  Tobacco Use  . Smoking status: Current Every Day Smoker    Packs/day: 1.00    Types: Cigarettes  . Smokeless tobacco: Never Used  Substance and Sexual Activity  . Alcohol use: Not Currently    Comment: recovering alchohlic. Currently craving crack cocain has not used in 2 years.   . Drug use: Yes    Frequency: 7.0 times per week    Types: Marijuana, Cocaine, Methamphetamines    Comment: 1 gram every 2 to 3 days.   . Sexual activity: Never    Birth control/protection: Surgical  Other Topics Concern  . Not on file  Social History Narrative   ** Merged History Encounter **       Social Determinants of Health   Financial Resource Strain: Low Risk   . Difficulty of Paying Living Expenses: Not hard at all  Food Insecurity: No Food Insecurity  . Worried About Charity fundraiser in the Last Year: Never true  . Ran Out of Food in the Last Year: Never true  Transportation Needs: No Transportation Needs  . Lack of Transportation (Medical): No  . Lack of Transportation (Non-Medical): No  Physical Activity: Sufficiently Active  . Days of Exercise per Week: 7 days  . Minutes of Exercise per Session: 30 min  Stress: Stress Concern Present  . Feeling of Stress : Very much  Social Connections: Socially Isolated  . Frequency of Communication with Friends and Family: Once a week  . Frequency of Social Gatherings with Friends and Family: Once a week  . Attends Religious Services: Never  . Active Member of Clubs or Organizations: No  . Attends Archivist Meetings:  Never  . Marital Status: Divorced   Additional Social History:   Sleep: Good  Appetite:  Good  Current Medications: Current Facility-Administered Medications  Medication Dose Route Frequency Provider Last Rate Last Admin  . acetaminophen (TYLENOL) tablet 650 mg  650 mg Oral Q6H PRN White, Patrice L, NP   650 mg at 04/20/21 0935  . alum & mag hydroxide-simeth (MAALOX/MYLANTA) 200-200-20 MG/5ML suspension 30 mL  30 mL Oral Q4H PRN White, Patrice L, NP      . FLUoxetine (PROZAC) capsule 10 mg  10 mg Oral Daily Shontelle Muska I, NP      . hydrOXYzine (ATARAX/VISTARIL) tablet 25 mg  25 mg Oral TID PRN White, Patrice L,  NP   25 mg at 04/20/21 0935  . OLANZapine zydis (ZYPREXA) disintegrating tablet 5 mg  5 mg Oral Q8H PRN White, Patrice L, NP       And  . LORazepam (ATIVAN) tablet 1 mg  1 mg Oral PRN White, Patrice L, NP       And  . ziprasidone (GEODON) injection 20 mg  20 mg Intramuscular PRN White, Patrice L, NP      . magnesium hydroxide (MILK OF MAGNESIA) suspension 30 mL  30 mL Oral Daily PRN White, Patrice L, NP      . nicotine (NICODERM CQ - dosed in mg/24 hours) patch 21 mg  21 mg Transdermal Daily Sharma Covert, Webster   21 mg at 04/20/21 0933  . QUEtiapine (SEROQUEL) tablet 200 mg  200 mg Oral QHS Lindell Spar I, NP       Lab Results:  Results for orders placed or performed during the hospital encounter of 04/19/21 (from the past 48 hour(s))  Hemoglobin A1c     Status: Abnormal   Collection Time: 04/19/21  5:39 PM  Result Value Ref Range   Hgb A1c MFr Bld 5.7 (H) 4.8 - 5.6 %    Comment: (NOTE) Pre diabetes:          5.7%-6.4%  Diabetes:              >6.4%  Glycemic control for   <7.0% adults with diabetes    Mean Plasma Glucose 116.89 mg/dL    Comment: Performed at [facility], [place] [address] [place], [place] [address]  Lipid panel     Status: Abnormal   Collection Time: 04/19/21  5:39 PM  Result Value Ref Range   Cholesterol 170 0 - 200 mg/dL   Triglycerides  176 (H) <150 mg/dL   HDL 43 >40 mg/dL   Total CHOL/HDL Ratio 4.0 RATIO   VLDL 35 0 - 40 mg/dL   LDL Cholesterol 92 0 - 99 mg/dL    Comment:        Total Cholesterol/HDL:CHD Risk Coronary Heart Disease Risk Table                     Men   Women  1/2 Average Risk   3.4   3.3  Average Risk       5.0   4.4  2 X Average Risk   9.6   7.1  3 X Average Risk  23.4   11.0        Use the calculated Patient Ratio above and the CHD Risk Table to determine the patient's CHD Risk.        ATP III CLASSIFICATION (LDL):  <100     mg/dL   Optimal  100-129  mg/dL   Near or Above                    Optimal  130-159  mg/dL   Borderline  160-189  mg/dL   High  >190     mg/dL   Very High Performed at [facility] [address], [place], [place] [address]   TSH     Status: None   Collection Time: 04/19/21  5:39 PM  Result Value Ref Range   TSH 1.278 0.350 - 4.500 uIU/mL    Comment: Performed by a 3rd Generation assay with a functional sensitivity of <=0.01 uIU/mL. Performed at [facility], [place] [address] [place], [place]  [address]    Blood Alcohol level:  Lab Results  Component Value Date   ETH <10 04/18/2021   ETH <5 96/22/2979   Metabolic Disorder Labs: Lab Results  Component Value Date   HGBA1C 5.7 (H) 04/19/2021   MPG 116.89 04/19/2021   No results found for: PROLACTIN Lab Results  Component Value Date   CHOL 170 04/19/2021   TRIG 176 (H) 04/19/2021   HDL 43 04/19/2021   CHOLHDL 4.0 04/19/2021   VLDL 35 04/19/2021   LDLCALC 92 04/19/2021   Physical Findings: AIMS: Facial and Oral Movements Muscles of Facial Expression: None, normal Lips and Perioral Area: None, normal Jaw: None, normal Tongue: None, normal,Extremity Movements Upper (arms, wrists, hands, fingers): None, normal Lower (legs, knees, ankles, toes): None, normal, Trunk Movements Neck, shoulders, hips: None, normal, Overall Severity Severity of abnormal movements  (highest score from questions above): None, normal Incapacitation due to abnormal movements: None, normal Patient's awareness of abnormal movements (rate only patient's report): No Awareness, Dental Status Current problems with teeth and/or dentures?: No Does patient usually wear dentures?: No  CIWA:    COWS:     Musculoskeletal: Strength & Muscle Tone: within normal limits Gait & Station: normal Patient leans: N/A  Psychiatric Specialty Exam:  Presentation  General Appearance: Casual; Fairly Groomed; Appropriate for Environment  Eye Contact:Good  Speech:Clear and Coherent; Normal Rate  Speech Volume:Normal  Handedness:Right  Mood and Affect  Mood:Anxious; Depressed ("Improving")  Affect:Congruent  Thought Process  Thought Processes:Coherent; Goal Directed  Descriptions of Associations:Intact  Orientation:Full (Time, Place and Person)  Thought Content:Logical  History of Schizophrenia/Schizoaffective disorder:No  Duration of Psychotic Symptoms:No data recorded Hallucinations:Hallucinations: None  Ideas of Reference:None  Suicidal Thoughts:Suicidal Thoughts: No SI Active Intent and/or Plan: Without Intent; Without Plan; Without Means to Carry Out; Without Access to Means  Homicidal Thoughts:Homicidal Thoughts: No  Sensorium  Memory:Immediate Good; Recent Good; Remote Good  Judgment:Good  Insight:Present  Executive Functions  Concentration:Good  Attention Span:Good  [place] of Knowledge:Good  Language:Good  Psychomotor Activity  Psychomotor Activity:Psychomotor Activity: Normal  Assets  Assets:Communication Skills; Desire for Improvement; Physical Health; Resilience; Social Support  Sleep  Sleep:Sleep: Good Number of Hours of Sleep: 6.75  Physical Exam: Physical Exam Vitals and nursing note reviewed.  HENT:     Head: Normocephalic.     Nose: Nose normal.     Mouth/Throat:     Pharynx: Oropharynx is clear.  Eyes:      Pupils: Pupils are equal, round, and reactive to light.  Cardiovascular:     Rate and Rhythm: Normal rate.     Pulses: Normal pulses.  Pulmonary:     Effort: Pulmonary effort is normal.  Genitourinary:    Comments: Deferred Musculoskeletal:        General: Normal range of motion.     Cervical back: Normal range of motion.  Skin:    General: Skin is warm and dry.  Neurological:     General: No focal deficit present.     Mental Status: She is alert and oriented to person, place, and time.    Review of Systems  Constitutional: Negative.   HENT: Negative.   Eyes: Negative.   Respiratory: Negative.   Cardiovascular: Negative.   Gastrointestinal: Negative.   Genitourinary: Negative.   Skin: Negative.   Neurological: Negative.   Endo/Heme/Allergies: Negative.   Psychiatric/Behavioral: Positive for depression ("Improving") and substance abuse (UDS (+) for Amph, cocaine/THC). Negative for hallucinations, memory loss and suicidal ideas. The patient is  nervous/anxious ("Improving"). The patient does not have insomnia.    Blood pressure 127/87, pulse 85, temperature 98.7 F (37.1 C), temperature source Oral, resp. rate 18, height 5' 6"  (1.676 m), weight 84.4 kg, SpO2 100 %. Body mass index is 30.02 kg/m.  Treatment Plan Summary: Daily contact with patient to assess and evaluate symptoms and progress in treatment and Medication management.  Continue inpatient hospitalization. Will continue today 04/20/2021 plan as below except where it is noted.   Mood control. Increased Seroquel from 100 mg to Seroquel 200 mg po Q bedtime.  Depression. Initiated Prozac10 mg po daily.  Anxiety. Continue Vistaril 25 mg po tid prn.  Agitation protocols. Continue Olanzapine zydis 5 mg po tid.  &  Lorazepam 1 mg po prn x 1 dose. OR Geodon 20 mg IM prn x 1 dose. & Lorazepam 1 mg po x 1 dose.  Continue the Nicotine patch 21 mg topically Q 24 hrs for smoking cessation. Continue the routine prn  medications for anxiety, pain etc as recommended. Encourage group participation. Discharge disposition plan in progress.  Lindell Spar, NP, pmhnp, fnp-bc 04/20/2021, 11:56 AM

## 2021-04-20 NOTE — Progress Notes (Signed)
   04/20/21 0930  Psych Admission Type (Psych Patients Only)  Admission Status Involuntary  Psychosocial Assessment  Patient Complaints Anxiety;Malaise;Shakiness  Eye Contact Brief  Facial Expression Animated;Anxious;Worried  Affect Anxious;Apprehensive;Irritable  Speech Logical/coherent  Interaction Assertive  Motor Activity Fidgety  Appearance/Hygiene Disheveled;In scrubs  Behavior Characteristics Cooperative;Anxious  Mood Depressed;Labile  Thought Process  Coherency WDL  Content WDL  Delusions WDL  Perception WDL  Hallucination None reported or observed  Judgment Poor  Confusion None  Danger to Self  Current suicidal ideation? Denies  Danger to Others  Danger to Others None reported or observed    Pt has been calm and cooperative this shift.  Pt endorses anxiety and has responded well to PRN vistaril.  RN assessed for needs and concerns and provided support  Pt remains safe on the unit with q 15 min checks in place.

## 2021-04-20 NOTE — BHH Group Notes (Signed)
.  Psychoeducational Group Note  Date: 04/20/2021 Time: 0900-1000    Goal Setting   Purpose of Group: This group helps to provide patients with the steps of setting a goal that is specific, measurable, attainable, realistic and time specific. A discussion on how we keep ourselves stuck with negative self talk.    Participation Level:  Active  Participation Quality:  Appropriate  Affect:  Appropriate  Cognitive:  Appropriate  Insight:  Improving  Engagement in Group:  Engaged  Additional Comments:  Attended the group. Rates her energy as a 4/10. Was responsive during the group providing feedback to others and asking appropriate questions.  Michele Webster

## 2021-04-20 NOTE — Progress Notes (Signed)
   04/20/21 0500  Psych Admission Type (Psych Patients Only)  Admission Status Involuntary  Psychosocial Assessment  Patient Complaints Anxiety;Depression  Eye Contact Intense  Facial Expression Angry;Animated;Anxious;Worried  Affect Angry;Anxious;Apprehensive;Irritable  Speech Rapid;Pressured;Loud  Interaction Arrogant;Attention-seeking;Demanding;Forwards little;Guarded  Motor Activity Fidgety;Restless  Appearance/Hygiene Disheveled;In scrubs  Behavior Characteristics Anxious  Mood Depressed;Anxious  Thought Process  Coherency Concrete thinking  Content Blaming others  Delusions Controlled;Persecutory  Perception WDL  Hallucination None reported or observed  Judgment Poor  Confusion None  Danger to Self  Current suicidal ideation? Denies  Danger to Others  Danger to Others None reported or observed   Patient refused hs Quetiapine 100 mg stating she is not suppose to be in the hospital. Support and encouragement provided as needed.

## 2021-04-21 MED ORDER — QUETIAPINE FUMARATE 200 MG PO TABS: 200.0000 mg | ORAL_TABLET | Freq: Every day | ORAL | 0 refills | Status: DC

## 2021-04-21 MED ORDER — NICOTINE 21 MG/24HR TD PT24: 21.0000 mg | MEDICATED_PATCH | Freq: Every day | TRANSDERMAL | 0 refills | Status: DC

## 2021-04-21 MED ORDER — HYDROXYZINE HCL 25 MG PO TABS: 25.0000 mg | ORAL_TABLET | Freq: Three times a day (TID) | ORAL | 0 refills | Status: DC | PRN

## 2021-04-21 MED ORDER — FLUOXETINE HCL 10 MG PO CAPS: 10.0000 mg | ORAL_CAPSULE | Freq: Every day | ORAL | 0 refills | Status: DC

## 2021-04-21 NOTE — Progress Notes (Signed)
The focus of this group is to help patients establish daily goals to achieve during treatment and discuss how the patient can incorporate goal setting into their daily lives to aide in recovery. 

## 2021-04-21 NOTE — BHH Suicide Risk Assessment (Signed)
[facility] Discharge Suicide Risk Assessment   Principal Problem: Bipolar 2 disorder [facility]) Discharge Diagnoses: Principal Problem:   Bipolar 2 disorder ([place]) Active Problems:   Polysubstance abuse ([place])   Cocaine use disorder, moderate, dependence (HCC)   GAD (generalized anxiety disorder)   MDD (major depressive disorder)   Total Time spent with patient: 15 minutes  Musculoskeletal: Strength & Muscle Tone: within normal limits Gait & Station: normal Patient leans: N/A  Psychiatric Specialty Exam  Presentation  General Appearance: Casual  Eye Contact:Good  Speech:Clear and Coherent; Normal Rate  Speech Volume:Normal  Handedness:Right   Mood and Affect  Mood:Euthymic  Duration of Depression Symptoms: No data recorded Affect:Congruent   Thought Process  Thought Processes:Coherent  Descriptions of Associations:Intact  Orientation:Full (Time, Place and Person)  Thought Content:Logical  History of Schizophrenia/Schizoaffective disorder:No  Duration of Psychotic Symptoms:No data recorded Hallucinations:Hallucinations: None  Ideas of Reference:None  Suicidal Thoughts:Suicidal Thoughts: No SI Active Intent and/or Plan: Without Intent; Without Plan; Without Means to Carry Out; Without Access to Means  Homicidal Thoughts:Homicidal Thoughts: No   Sensorium  Memory:Immediate Fair; Recent Fair; Remote Fair  Judgment:Good  Insight:Fair   Executive Functions  Concentration:Good  Attention Span:Good  [place] of Knowledge:Good  Language:Good   Psychomotor Activity  Psychomotor Activity:Psychomotor Activity: Normal   Assets  Assets:Desire for Improvement; Housing; Resilience; Social Support   Sleep  Sleep:Sleep: Good Number of Hours of Sleep: 6.25   Physical Exam: Physical Exam Vitals and nursing note reviewed.  Constitutional:      Appearance: Normal appearance.  HENT:     Head: Normocephalic and atraumatic.  Pulmonary:      Effort: Pulmonary effort is normal.  Neurological:     General: No focal deficit present.     Mental Status: She is alert and oriented to person, place, and time.    Review of Systems  All other systems reviewed and are negative.  Blood pressure 120/89, pulse (!) 105, temperature 98 F (36.7 C), temperature source Oral, resp. rate 18, height 5' 6"  (1.676 m), weight 84.4 kg, SpO2 (!) 85 %. Body mass index is 30.02 kg/m.  Mental Status Per Nursing Assessment::   On Admission:  Suicidal ideation indicated by patient,Plan includes specific time, place, or method,Intention to act on suicide plan,Suicidal ideation indicated by others,Belief that plan would result in death,Suicide plan  Demographic Factors:  Caucasian and Low socioeconomic status  Loss Factors: Financial problems/change in socioeconomic status  Historical Factors: Impulsivity  Risk Reduction Factors:   Living with another person, especially a relative and Positive social support  Continued Clinical Symptoms:  Bipolar Disorder:   Mixed State Alcohol/Substance Abuse/Dependencies  Cognitive Features That Contribute To Risk:  None    Suicide Risk:  Minimal: No identifiable suicidal ideation.  Patients presenting with no risk factors but with morbid ruminations; may be classified as minimal risk based on the severity of the depressive symptoms    Plan Of Care/Follow-up recommendations:  Activity:  ad lib  Sharma Covert, MD 04/21/2021, 3:36 PM

## 2021-04-21 NOTE — BHH Group Notes (Signed)
Adult Psychoeducational Group Not Date:  04/21/2021 Time:  [id number] Group Topic/Focus: PROGRESSIVE RELAXATION. A group where deep breathing is taught and tensing and relaxation muscle groups is used. Imagery is used as well.  Pts are asked to imagine 3 pillars that hold them up when they are not able to hold themselves up.  Participation Level:  Did not attend     Paulino Rily

## 2021-04-21 NOTE — Discharge Summary (Signed)
Physician Discharge Summary Note  Patient:  Michele Webster is an 45 y.o., female MRN:  [id number] DOB:  January 12, 1976 Patient phone:  [phone] (home)  Patient address:   [place] [address],  Total Time spent with patient: 15 minutes  Date of Admission:  04/19/2021 Date of Discharge: 04/21/2021  Reason for Admission: Per admission assessment note:Michele Pressleyis a 45 year old female with a past history of substance abuse (methamphetamine, cocaine, cannabis, alcohol), bipolar disorder type II versus major depressive disorder, anxiety who presented to [facility] a walk-in with a chief complaint of "I tried to kill myself several days ago." On initial presentation to [facility] 04/18/2021, the patient was allowed, diaphoretic, agitated, angry, pacing, pressured and reported an attempt to kill herself by overdose on pills several days prior to presentation. She stated that she had taken 100 pills of medications but was unable to name the medications. She also reported that she had relapsed on cocaine and methamphetamine. She pulled the STARRalarm multiple times while being triaged. She was placed on IVC and sent to [facility] ED for medical clearance given her reported recent overdose. Urine drug screen in the ED was positive for amphetamines, cocaine and THC. Salicylate level was <7.0.Acetaminophen level was<10.CMP, CBC and differential were essentially normal and the patient was deemed medically stable for psychiatric admission and was transferred back [facility].  Principal Problem: Bipolar 2 disorder [facility]) Discharge Diagnoses: Principal Problem:   Bipolar 2 disorder ([place]) Active Problems:   Polysubstance abuse ([place])   Cocaine use disorder, moderate, dependence (HCC)   GAD (generalized anxiety disorder)   MDD (major depressive disorder)   Past Psychiatric History:   Past Medical History:  Past Medical History:  Diagnosis Date  . Anxiety   . Bipolar 1  disorder ([place])   . Bipolar 2 disorder ([place]) 04/19/2021  . Depression   . High cholesterol   . Substance abuse [facility])     Past Surgical History:  Procedure Laterality Date  . TIBIA IM NAIL INSERTION Right 12/31/2017   Procedure: INTRAMEDULLARY (IM) NAIL TIBIAL;  Surgeon: Dorna Leitz, MD;  Location: [facility] ORS;  Service: Orthopedics;  Laterality: Right;  . TUBAL LIGATION     Family History:  Family History  Problem Relation Age of Onset  . Cancer Maternal Grandmother        breast   Family Psychiatric  History:  Social History:  Social History   Substance and Sexual Activity  Alcohol Use Not Currently   Comment: recovering alchohlic. Currently craving crack cocain has not used in 2 years.      Social History   Substance and Sexual Activity  Drug Use Yes  . Frequency: 7.0 times per week  . Types: Marijuana, Cocaine, Methamphetamines   Comment: 1 gram every 2 to 3 days.     Social History   Socioeconomic History  . Marital status: Divorced    Spouse name: Not on file  . Number of children: Not on file  . Years of education: Not on file  . Highest education level: Not on file  Occupational History  . Not on file  Tobacco Use  . Smoking status: Current Every Day Smoker    Packs/day: 1.00    Types: Cigarettes  . Smokeless tobacco: Never Used  Substance and Sexual Activity  . Alcohol use: Not Currently    Comment: recovering alchohlic. Currently craving crack cocain has not used in 2 years.   . Drug use: Yes    Frequency: 7.0  times per week    Types: Marijuana, Cocaine, Methamphetamines    Comment: 1 gram every 2 to 3 days.   . Sexual activity: Never    Birth control/protection: Surgical  Other Topics Concern  . Not on file  Social History Narrative   ** Merged History Encounter **       Social Determinants of Health   Financial Resource Strain: Low Risk   . Difficulty of Paying Living Expenses: Not hard at all  Food Insecurity: No Food Insecurity  . Worried  About Charity fundraiser in the Last Year: Never true  . Ran Out of Food in the Last Year: Never true  Transportation Needs: No Transportation Needs  . Lack of Transportation (Medical): No  . Lack of Transportation (Non-Medical): No  Physical Activity: Sufficiently Active  . Days of Exercise per Week: 7 days  . Minutes of Exercise per Session: 30 min  Stress: Stress Concern Present  . Feeling of Stress : Very much  Social Connections: Socially Isolated  . Frequency of Communication with Friends and Family: Once a week  . Frequency of Social Gatherings with Friends and Family: Once a week  . Attends Religious Services: Never  . Active Member of Clubs or Organizations: No  . Attends Archivist Meetings: Never  . Marital Status: Divorced    Hospital Course:  Michele Webster was admitted for Bipolar 2 disorder [facility]) and crisis management.  Pt was treated discharged with the medications listed below under Medication List.  Medical problems were identified and treated as needed.  Home medications were restarted as appropriate.  Improvement was monitored by observation and Michele Webster 's daily report of symptom reduction.  Emotional and mental status was monitored by daily self-inventory reports completed by Michele Webster and clinical staff.         Michele Webster was evaluated by the treatment team for stability and plans for continued recovery upon discharge. Michele Webster 's motivation was an integral factor for scheduling further treatment. Employment, transportation, bed availability, health status, family support, and any pending legal issues were also considered during hospital stay. Pt was offered further treatment options upon discharge including but not limited to Residential, Intensive Outpatient, and Outpatient treatment.  Michele Webster will follow up with the services as listed below under Follow Up Information.     Upon completion of this  admission the patient was both mentally and medically stable for discharge denying suicidal/homicidal ideation, auditory/visual/tactile hallucinations, delusional thoughts and paranoia. Michele Webster attended and participated  with daily group session with active and engaged participation. Patient appeared insightful.   Michele Webster responded well to treatment with Prozac 10 mg and Seroquel 200 mg QHS without adverse effects.  Pt demonstrated improvement without reported or observed adverse effects to the point of stability appropriate for outpatient management. Pertinent labs include: Lipid Panel  176, A1c 5.7 a for which outpatient follow-up is necessary for lab recheck as mentioned below. Reviewed CBC, CMP, BAL, and UDS+ Cocaine , Amphetamine and THC ; all unremarkable aside from noted exceptions.   Physical Findings: AIMS: Facial and Oral Movements Muscles of Facial Expression: None, normal Lips and Perioral Area: None, normal Jaw: None, normal Tongue: None, normal,Extremity Movements Upper (arms, wrists, hands, fingers): None, normal Lower (legs, knees, ankles, toes): None, normal, Trunk Movements Neck, shoulders, hips: None, normal, Overall Severity Severity of abnormal movements (highest score from questions above): None, normal Incapacitation due to abnormal movements: None,  normal Patient's awareness of abnormal movements (rate only patient's report): No Awareness, Dental Status Current problems with teeth and/or dentures?: No Does patient usually wear dentures?: No  CIWA:    COWS:     Musculoskeletal: Strength & Muscle Tone: within normal limits Gait & Station: normal Patient leans: N/A    Psychiatric Specialty Exam:  Presentation  General Appearance: Casual; Fairly Groomed; Appropriate for Environment  Eye Contact:Good  Speech:Clear and Coherent; Normal Rate  Speech Volume:Normal  Handedness:Right   Mood and Affect  Mood:Anxious; Depressed  ("Improving")  Affect:Congruent   Thought Process  Thought Processes:Coherent; Goal Directed  Descriptions of Associations:Intact  Orientation:Full (Time, Place and Person)  Thought Content:Logical  History of Schizophrenia/Schizoaffective disorder:No  Duration of Psychotic Symptoms:No data recorded Hallucinations:Hallucinations: None  Ideas of Reference:None  Suicidal Thoughts:Suicidal Thoughts: No SI Active Intent and/or Plan: Without Intent; Without Plan; Without Means to Carry Out; Without Access to Means  Homicidal Thoughts:Homicidal Thoughts: No   Sensorium  Memory:Immediate Good; Recent Good; Remote Good  Judgment:Good  Insight:Present   Executive Functions  Concentration:Good  Attention Span:Good  [place] of Knowledge:Good  Language:Good   Psychomotor Activity  Psychomotor Activity:Psychomotor Activity: Normal   Assets  Assets:Communication Skills; Desire for Improvement; Physical Health; Resilience; Social Support   Sleep  Sleep:Sleep: Good Number of Hours of Sleep: 6.75    Physical Exam: Physical Exam Vitals reviewed.  Cardiovascular:     Rate and Rhythm: Normal rate.  Neurological:     Mental Status: She is alert.  Psychiatric:        Attention and Perception: Attention normal.        Mood and Affect: Mood normal.        Speech: Speech normal.        Behavior: Behavior normal.        Thought Content: Thought content normal.        Cognition and Memory: Cognition normal.        Judgment: Judgment normal.    Review of Systems  Psychiatric/Behavioral: Positive for substance abuse. Negative for depression and suicidal ideas. The patient is not nervous/anxious.   All other systems reviewed and are negative.  Blood pressure 120/89, pulse (!) 105, temperature 98 F (36.7 C), temperature source Oral, resp. rate 18, height 5' 6"  (1.676 m), weight 84.4 kg, SpO2 (!) 85 %. Body mass index is 30.02 kg/m.   Have you used  any form of tobacco in the last 30 days? (Cigarettes, Smokeless Tobacco, Cigars, and/or Pipes): Yes  Has this patient used any form of tobacco in the last 30 days? (Cigarettes, Smokeless Tobacco, Cigars, and/or Pipes) Yes, Yes, A prescription for an FDA-approved tobacco cessation medication was offered at discharge and the patient refused  Blood Alcohol level:  Lab Results  Component Value Date   [facility] <10 04/18/2021   ETH <5 74/16/3845    Metabolic Disorder Labs:  Lab Results  Component Value Date   HGBA1C 5.7 (H) 04/19/2021   MPG 116.89 04/19/2021   No results found for: PROLACTIN Lab Results  Component Value Date   CHOL 170 04/19/2021   TRIG 176 (H) 04/19/2021   HDL 43 04/19/2021   CHOLHDL 4.0 04/19/2021   VLDL 35 04/19/2021   [place] 92 04/19/2021    See Psychiatric Specialty Exam and Suicide Risk Assessment completed by Attending Physician prior to discharge.  Discharge destination:  Home  Is patient on multiple antipsychotic therapies at discharge:  No   Has Patient had three or more failed  trials of antipsychotic monotherapy by history:  No  Recommended Plan for Multiple Antipsychotic Therapies: NA   Allergies as of 04/21/2021      Reactions   Sertraline Other (See Comments)   Makes her crazy   Ceclor [cefaclor] Hives, Nausea And Vomiting, Swelling      Medication List    TAKE these medications     Indication  FLUoxetine 10 MG capsule Commonly known as: PROZAC Take 1 capsule (10 mg total) by mouth daily. Start taking on: Apr 22, 2021  Indication: Major Depressive Disorder   hydrOXYzine 25 MG tablet Commonly known as: ATARAX/VISTARIL Take 1 tablet (25 mg total) by mouth 3 (three) times daily as needed for anxiety.  Indication: Feeling Anxious   nicotine 21 mg/24hr patch Commonly known as: NICODERM CQ - dosed in mg/24 hours Place 1 patch (21 mg total) onto the skin daily. (may buy from over the Counter): For smoking cessation Start taking on: Apr 22, 2021  Indication: Nicotine Addiction   QUEtiapine 200 MG tablet Commonly known as: SEROQUEL Take 1 tablet (200 mg total) by mouth at bedtime. For mood control  Indication: Mood control        Follow-up recommendations:  Activity:  as tolerated Diet:  heart healthy  Comments:  Take all medications as prescribed. Keep all follow-up appointments as scheduled.  Do not consume alcohol or use illegal drugs while on prescription medications. Report any adverse effects from your medications to your primary care provider promptly.  In the event of recurrent symptoms or worsening symptoms, call 911, a crisis hotline, or go to the nearest emergency department for evaluation.   Signed: Derrill Center, NP 04/21/2021, 3:29 PM
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
## 2021-04-21 NOTE — Progress Notes (Addendum)
PATIENT STATED SHE WAS NOT TOLD THAT SHE HAS A UTI.  IT WAS HER ROOMMATE.

## 2021-04-21 NOTE — B[facility] Group Notes (Signed)
Psychoeducational Group Note  Date: 04-21-21 Time:  1300  Group Topic/Focus:  Making Healthy Choices:   The focus of this group is to help patients identify negative/unhealthy choices they were using prior to admission and identify positive/healthier coping strategies to replace them upon discharge.In this group, patients started asking about the brain and how the brain works with and how the chemicals work for those who use substances, the pros and cons of saboxone.  Participation Level:  Did not attend  Participation Quality:  Appropriate  Affect:  Appropriate  Cognitive:  Oriented  Insight:  Improving  Engagement in Group:  Engaged  Additional Comments:...    Michele Webster

## 2021-04-21 NOTE — Progress Notes (Signed)
   04/20/21 2145  Psych Admission Type (Psych Patients Only)  Admission Status Involuntary  Psychosocial Assessment  Patient Complaints None  Eye Contact Brief  Facial Expression Animated;Anxious;Worried  Affect Anxious;Apprehensive;Irritable  Speech Logical/coherent  Interaction Assertive  Motor Activity Fidgety  Appearance/Hygiene Disheveled;In scrubs  Behavior Characteristics Cooperative  Mood Pleasant  Thought Process  Coherency WDL  Content WDL  Delusions WDL  Perception WDL  Hallucination None reported or observed  Judgment Poor  Confusion None  Danger to Self  Current suicidal ideation? Denies  Danger to Others  Danger to Others None reported or observed

## 2021-04-21 NOTE — Progress Notes (Signed)
D:  Patient denied SI and HI, contracts for safety.  Denied A/V hallucinations.  Patient's self inventory sheet, patient sleeps good, no sleep medication.  Good appetite, normal energy level, good concentration.  Denied depression and hopeless, anxiety #3.  Denied withdrawals.  Denied SI.  Denied physical problems.  Physical pain #7 in past 24 hours, legs.  Goal is talk to children and stay happy.  Plans to call, talk.  No discharge plans. A:  Medications administered per MD orders.  Emotional support and encouragement given patient. R:  Safety maintained with 15 minute checks.

## 2021-04-21 NOTE — Progress Notes (Signed)
Discharge Note:  Patient discharged home with her mother.  Suicide prevention information given and discussed with patient who stated she understood and had no questions.  Patient denied SI and HI.  Denied A/V hallucinations.  Patient stated she received all her belongings, clothing, toiletries, misc items.  Patient stated she appreciated all assistance received from [facility] staff.  All required discharge information given patient.

## 2021-04-21 NOTE — Plan of Care (Signed)
Nurse discussed coping skills with patient.
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
## 2021-04-22 NOTE — Progress Notes (Deleted)
  [facility] Adult Case Management Discharge Plan :  Will you be returning to the same living situation after discharge:  Yes,  personal home At discharge, do you have transportation home?: Yes,  family Do you have the ability to pay for your medications: Yes,  Medicaid  Release of information consent forms completed and in the chart;  Patient's signature needed at discharge.  Patient to Follow up at:  [facility].   Specialty: Behavioral Health Why: You may walk into this facility Monday-Friday 7:30am-4:30pm for medication management and therapy.  Contact information: [place] [phone]              Next level of care provider has access to [place] and Suicide Prevention discussed: Yes,  mother  Have you used any form of tobacco in the last 30 days? (Cigarettes, Smokeless Tobacco, Cigars, and/or Pipes): Yes  Has patient been referred to the Quitline?: Patient refused referral  Patient has been referred for addiction treatment: Pt. refused referral  Mliss Fritz, Latanya Presser 04/22/2021, 8:11 AM

## 2021-04-22 NOTE — Progress Notes (Deleted)
  [facility] Adult Case Management Discharge Plan :  Will you be returning to the same living situation after discharge:  Yes,  personal home At discharge, do you have transportation home?: Yes,  family Do you have the ability to pay for your medications: Yes,  medicaid  Release of information consent forms completed and in the chart;  Patient's signature needed at discharge.  Patient to Follow up at:  [facility].   Specialty: Behavioral Health Why: You may walk into this facility Monday-Friday 7:30am-4:30pm for medication management and therapy.  Contact information: [place] [phone]              Next level of care provider has access to [place] and Suicide Prevention discussed: Yes,  mom  Have you used any form of tobacco in the last 30 days? (Cigarettes, Smokeless Tobacco, Cigars, and/or Pipes): Yes  Has patient been referred to the Quitline?: Patient refused referral  Patient has been referred for addiction treatment: Pt. refused referral  Mliss Fritz, [place] 04/22/2021, 10:12 AM

## 2021-04-23 NOTE — Progress Notes (Signed)
  [facility] Adult Case Management Discharge Plan :  Will you be returning to the same living situation after discharge:  Yes,  personal home.  At discharge, do you have transportation home?: Yes,  family Do you have the ability to pay for your medications: Yes,  medicaid  Release of information consent forms completed and in the chart;  Patient's signature needed at discharge.  Patient to Follow up at:  [facility] up.   Specialty: Behavioral Health Why: You may walk into this facility Monday-Friday 7:30am-4:30pm for medication management and therapy.  Contact information: [place] [phone]              Next level of care provider has access to [place] and Suicide Prevention discussed: Yes,  mother  Have you used any form of tobacco in the last 30 days? (Cigarettes, Smokeless Tobacco, Cigars, and/or Pipes): Yes  Has patient been referred to the Quitline?: Patient refused referral  Patient has been referred for addiction treatment: N/A  Mliss Fritz, Latanya Presser 04/23/2021, 9:38 AM

## 2021-09-09 ENCOUNTER — Encounter (HOSPITAL_COMMUNITY): Payer: Self-pay

## 2021-09-09 ENCOUNTER — Telehealth (INDEPENDENT_AMBULATORY_CARE_PROVIDER_SITE_OTHER): Payer: Medicaid Other | Admitting: Psychiatry

## 2021-09-09 VITALS — BP 120/89 | HR 105 | Ht 66.0 in | Wt 186.0 lb

## 2021-09-09 DIAGNOSIS — F411 Generalized anxiety disorder: Secondary | ICD-10-CM

## 2021-09-09 DIAGNOSIS — F1520 Other stimulant dependence, uncomplicated: Secondary | ICD-10-CM | POA: Diagnosis not present

## 2021-09-09 DIAGNOSIS — F331 Major depressive disorder, recurrent, moderate: Secondary | ICD-10-CM | POA: Insufficient documentation

## 2021-09-09 MED ORDER — GABAPENTIN 100 MG PO CAPS: 100.0000 mg | ORAL_CAPSULE | Freq: Three times a day (TID) | ORAL | 2 refills | Status: DC

## 2021-09-09 MED ORDER — QUETIAPINE FUMARATE 200 MG PO TABS: 200.0000 mg | ORAL_TABLET | Freq: Every day | ORAL | 2 refills | Status: DC

## 2021-09-09 MED ORDER — FLUOXETINE HCL 20 MG PO CAPS: 20.0000 mg | ORAL_CAPSULE | Freq: Every day | ORAL | 2 refills | Status: DC
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
## 2021-11-08 ENCOUNTER — Emergency Department (HOSPITAL_COMMUNITY): Payer: No Typology Code available for payment source

## 2021-11-08 ENCOUNTER — Encounter (HOSPITAL_COMMUNITY): Payer: Self-pay | Admitting: Emergency Medicine

## 2021-11-08 ENCOUNTER — Other Ambulatory Visit: Payer: Self-pay

## 2021-11-08 ENCOUNTER — Emergency Department (HOSPITAL_COMMUNITY)
Admission: EM | Admit: 2021-11-08 | Discharge: 2021-11-08 | Disposition: A | Discharge status: Home / Self Care | Payer: No Typology Code available for payment source | Attending: Emergency Medicine | Admitting: Emergency Medicine

## 2021-11-08 DIAGNOSIS — F17210 Nicotine dependence, cigarettes, uncomplicated: Secondary | ICD-10-CM | POA: Insufficient documentation

## 2021-11-08 DIAGNOSIS — S52202A Unspecified fracture of shaft of left ulna, initial encounter for closed fracture: Secondary | ICD-10-CM | POA: Diagnosis not present

## 2021-11-08 DIAGNOSIS — Z79899 Other long term (current) drug therapy: Secondary | ICD-10-CM | POA: Diagnosis not present

## 2021-11-08 DIAGNOSIS — S59902A Unspecified injury of left elbow, initial encounter: Secondary | ICD-10-CM | POA: Diagnosis present

## 2021-11-08 DIAGNOSIS — Y92410 Unspecified street and highway as the place of occurrence of the external cause: Secondary | ICD-10-CM | POA: Insufficient documentation

## 2021-11-08 DIAGNOSIS — S52209A Unspecified fracture of shaft of unspecified ulna, initial encounter for closed fracture: Secondary | ICD-10-CM

## 2021-11-08 HISTORY — DX: Unspecified fracture of shaft of unspecified ulna, initial encounter for closed fracture: S52.209A

## 2021-11-08 MED ORDER — MORPHINE SULFATE (PF) 4 MG/ML IV SOLN
4.0000 mg | Freq: Once | INTRAVENOUS | Status: AC
  Administered 2021-11-08: 4 mg via INTRAVENOUS
  Filled 2021-11-08: qty 1

## 2021-11-08 MED ORDER — NAPROXEN 375 MG PO TABS: 375.0000 mg | ORAL_TABLET | Freq: Two times a day (BID) | ORAL | 0 refills | Status: DC

## 2021-11-08 MED ORDER — HYDROCODONE-ACETAMINOPHEN 5-325 MG PO TABS: 1.0000 | ORAL_TABLET | Freq: Four times a day (QID) | ORAL | 0 refills | Status: DC | PRN
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
## 2021-11-08 NOTE — Progress Notes (Signed)
Orthopedic Tech Progress Note Patient Details:  Michele Webster 1976/07/19 [id number]  Ortho Devices Type of Ortho Device: Sugartong splint, Arm sling Ortho Device/Splint Location: Left Arm Ortho Device/Splint Interventions: Application   Post Interventions Patient Tolerated: Well  [name] 11/08/2021, 9:33 PM

## 2021-11-18 ENCOUNTER — Encounter (HOSPITAL_BASED_OUTPATIENT_CLINIC_OR_DEPARTMENT_OTHER): Payer: Self-pay | Admitting: Orthopedic Surgery

## 2021-11-18 ENCOUNTER — Other Ambulatory Visit: Payer: Self-pay

## 2021-11-18 NOTE — Progress Notes (Signed)
Spoke w/ via phone for pre-op interview--- pt Lab needs dos----  urine preg,  urine drug screen (per anes, ask mda dos if needed)             Lab results------ no COVID test -----patient states asymptomatic no test needed Arrive at ------- 0530 on 11-21-2021 NPO after MN NO Solid Food.  Clear liquids from MN until--- 0430 Med rec completed Medications to take morning of surgery ----- gabapentin, prozac Diabetic medication ----- n/a Patient instructed no nail polish to be worn day of surgery Patient instructed to bring photo id and insurance card day of surgery Patient aware to have Driver (ride ) / caregiver  for 24 hours after surgery -- mother, Michele Webster Patient Special Instructions ----- n/a Pre-Op special Istructions ----- sent inbox message to dr spears in epic requested orders Patient verbalized understanding of instructions that were given at this phone interview. Patient denies shortness of breath, chest pain, fever, cough at this phone interview.

## 2021-11-20 NOTE — Anesthesia Preprocedure Evaluation (Addendum)
Anesthesia Evaluation  Patient identified by MRN, date of birth, ID band Patient awake    Reviewed: Allergy & Precautions, NPO status , Patient's Chart, lab work & pertinent test results  Airway Mallampati: II  TM Distance: >3 FB Neck ROM: Full    Dental no notable dental hx. (+) Teeth Intact, Dental Advisory Given   Pulmonary Current Smoker and Patient abstained from smoking.,    Pulmonary exam normal breath sounds clear to auscultation       Cardiovascular Exercise Tolerance: Good negative cardio ROS Normal cardiovascular exam Rhythm:Regular Rate:Normal     Neuro/Psych Anxiety Depression negative neurological ROS     GI/Hepatic negative GI ROS, (+)     substance abuse  , Drugs of Abuse         Component                Value               Date/Time                 LABOPIA                  NONE DETECTED       11/21/2021 0541           COCAINSCRNUR             POSITIVE (A)        11/21/2021 0541           LABBENZ                  NONE DETECTED       11/21/2021 0541           AMPHETMU                 POSITIVE (A)        11/21/2021 0541           THCU                     POSITIVE (A)        11/21/2021 0541           LABBARB                  NONE DETECTED       11/21/2021 0541          Endo/Other  negative endocrine ROS  Renal/GU      Musculoskeletal   Abdominal (+) + obese (BMI 32.45),   Peds  Hematology   Anesthesia Other Findings All: Ceclor,   Reproductive/Obstetrics negative OB ROS                           Anesthesia Physical Anesthesia Plan  ASA: 3  Anesthesia Plan: Regional   Post-op Pain Management: Regional block and Minimal or no pain anticipated   Induction:   PONV Risk Score and Plan: 2 and Midazolam, Propofol infusion and Treatment may vary due to age or medical condition  Airway Management Planned: Natural Airway and Nasal Cannula  Additional Equipment:  None  Intra-op Plan:   Post-operative Plan:   Informed Consent: I have reviewed the patients History and Physical, chart, labs and discussed the procedure including the risks, benefits and alternatives for the proposed anesthesia with the patient or authorized representative who has indicated his/her understanding and acceptance.     Dental advisory given  Plan Discussed with: CRNA and Anesthesiologist  Anesthesia Plan Comments: (L Supraclavicular block and MAC)       Anesthesia Quick Evaluation

## 2021-11-20 NOTE — H&P (Signed)
Preoperative History & Physical Exam  Surgeon: Matt Holmes, MD  Diagnosis: left ulnar shaft fracture  Planned Procedure: Procedure(s) (LRB): OPEN REDUCTION INTERNAL FIXATION (ORIF) left ULNAR shaft  FRACTURE (Left)  History of Present Illness:   Patient is a 45 y.o. female with symptoms consistent with  left ulnar shaft fracture who presents for surgical intervention. The risks, benefits and alternatives of surgical intervention were discussed and informed consent was obtained prior to surgery.  Past Medical History:  Past Medical History:  Diagnosis Date   Bipolar 2 disorder ([place]) 04/19/2021   Cocaine use disorder, moderate, dependence ([place])    GAD (generalized anxiety disorder)    History of suicide attempt    hx several  times,  last admitted to [facility] discharged 05/ 2022 in epic   MDD (major depressive disorder)    Methadone use disorder, moderate, dependence ([place])    Recovering alcoholic ([place])    Smokers' cough ([place])    11-18-2021 per pt morning productive cough   Ulnar shaft fracture 11/08/2021   left    Past Surgical History:  Past Surgical History:  Procedure Laterality Date   TIBIA IM NAIL INSERTION Right 12/31/2017   Procedure: INTRAMEDULLARY (IM) NAIL TIBIAL;  Surgeon: Dorna Leitz, MD;  Location: [facility];  Service: Orthopedics;  Laterality: Right;   TUBAL LIGATION Bilateral 11/13/2006   @[facility] ;   [facility]    Medications:  Prior to Admission medications   Medication Sig Start Date End Date Taking? Authorizing Provider  FLUoxetine (PROZAC) 20 MG capsule Take 1 capsule (20 mg total) by mouth daily. 09/09/21 11/18/21 Yes Patrecia Pour, NP  gabapentin (NEURONTIN) 100 MG capsule Take 1 capsule (100 mg total) by mouth 3 (three) times daily. 09/09/21 09/09/22 Yes Patrecia Pour, NP  naproxen (NAPROSYN) 375 MG tablet Take 1 tablet (375 mg total) by mouth 2 (two) times daily. 11/08/21  Yes Dorie Rank, MD  QUEtiapine (SEROQUEL) 200 MG tablet Take 1 tablet (200 mg total) by  mouth at bedtime. For mood control 09/09/21  Yes Lord, Asa Saunas, NP  nicotine (NICODERM CQ - DOSED IN MG/24 HOURS) 21 mg/24hr patch Place 1 patch (21 mg total) onto the skin daily. (may buy from over the Counter): For smoking cessation Patient not taking: Reported on 11/18/2021 04/22/21   Lindell Spar I, NP    Allergies:  Sertraline and Ceclor [cefaclor]  Review of Systems: Negative except per HPI.  Physical Exam: Alert and oriented, NAD Head and neck: no masses, normal alignment CV: pulse intact Pulm: no increased work of breathing, respirations even and unlabored Abdomen: non-distended Extremities: extremities warm and well perfused  LABS: No results found for this or any previous visit (from the past 2160 hour(s)).   Complete History and Physical exam available in the office notes  Orene Desanctis

## 2021-11-21 ENCOUNTER — Encounter (HOSPITAL_BASED_OUTPATIENT_CLINIC_OR_DEPARTMENT_OTHER): Payer: Self-pay | Admitting: Orthopedic Surgery

## 2021-11-21 ENCOUNTER — Ambulatory Visit (HOSPITAL_BASED_OUTPATIENT_CLINIC_OR_DEPARTMENT_OTHER): Payer: No Typology Code available for payment source | Admitting: Anesthesiology

## 2021-11-21 ENCOUNTER — Ambulatory Visit (HOSPITAL_BASED_OUTPATIENT_CLINIC_OR_DEPARTMENT_OTHER): Payer: No Typology Code available for payment source

## 2021-11-21 ENCOUNTER — Ambulatory Visit (HOSPITAL_BASED_OUTPATIENT_CLINIC_OR_DEPARTMENT_OTHER)
Admission: RE | Admit: 2021-11-21 | Discharge: 2021-11-21 | Disposition: A | Discharge status: Home / Self Care | Payer: No Typology Code available for payment source | Attending: Orthopedic Surgery | Admitting: Orthopedic Surgery

## 2021-11-21 ENCOUNTER — Encounter (HOSPITAL_BASED_OUTPATIENT_CLINIC_OR_DEPARTMENT_OTHER)
Admission: RE | Disposition: A | Discharge status: Home / Self Care | Payer: Self-pay | Source: Home / Self Care | Attending: Orthopedic Surgery

## 2021-11-21 DIAGNOSIS — Z791 Long term (current) use of non-steroidal anti-inflammatories (NSAID): Secondary | ICD-10-CM | POA: Insufficient documentation

## 2021-11-21 DIAGNOSIS — Z79899 Other long term (current) drug therapy: Secondary | ICD-10-CM | POA: Diagnosis not present

## 2021-11-21 DIAGNOSIS — F1520 Other stimulant dependence, uncomplicated: Secondary | ICD-10-CM

## 2021-11-21 DIAGNOSIS — S52232A Displaced oblique fracture of shaft of left ulna, initial encounter for closed fracture: Secondary | ICD-10-CM | POA: Insufficient documentation

## 2021-11-21 DIAGNOSIS — S52202A Unspecified fracture of shaft of left ulna, initial encounter for closed fracture: Secondary | ICD-10-CM | POA: Diagnosis present

## 2021-11-21 DIAGNOSIS — X58XXXA Exposure to other specified factors, initial encounter: Secondary | ICD-10-CM | POA: Insufficient documentation

## 2021-11-21 DIAGNOSIS — F1420 Cocaine dependence, uncomplicated: Secondary | ICD-10-CM

## 2021-11-21 HISTORY — DX: Major depressive disorder, single episode, unspecified: F32.9

## 2021-11-21 HISTORY — PX: ORIF ULNAR FRACTURE: SHX5417

## 2021-11-21 HISTORY — DX: Alcohol dependence, in remission: F10.21

## 2021-11-21 HISTORY — DX: Cocaine dependence, uncomplicated: F14.20

## 2021-11-21 HISTORY — DX: Generalized anxiety disorder: F41.1

## 2021-11-21 HISTORY — DX: Simple chronic bronchitis: J41.0

## 2021-11-21 HISTORY — DX: Personal history of suicidal behavior: Z91.51

## 2021-11-21 HISTORY — DX: Opioid dependence, uncomplicated: F11.20

## 2021-11-21 LAB — RAPID URINE DRUG SCREEN, HOSP PERFORMED
Amphetamines: POSITIVE — AB
Barbiturates: NOT DETECTED
Benzodiazepines: NOT DETECTED
Cocaine: POSITIVE — AB
Opiates: NOT DETECTED
Tetrahydrocannabinol: POSITIVE — AB

## 2021-11-21 LAB — POCT PREGNANCY, URINE: Preg Test, Ur: NEGATIVE

## 2021-11-21 SURGERY — OPEN REDUCTION INTERNAL FIXATION (ORIF) ULNAR FRACTURE
Anesthesia: Regional | Site: Arm Lower | Laterality: Left

## 2021-11-21 MED ORDER — PROPOFOL 10 MG/ML IV BOLUS
INTRAVENOUS | Status: DC | PRN
  Administered 2021-11-21: 20 mg via INTRAVENOUS

## 2021-11-21 MED ORDER — 0.9 % SODIUM CHLORIDE (POUR BTL) OPTIME
TOPICAL | Status: DC | PRN
  Administered 2021-11-21: 08:00:00 1000 mL

## 2021-11-21 MED ORDER — ROPIVACAINE HCL 5 MG/ML IJ SOLN
INTRAMUSCULAR | Status: DC | PRN
  Administered 2021-11-21: 30 mL via PERINEURAL

## 2021-11-21 MED ORDER — PROPOFOL 1000 MG/100ML IV EMUL
INTRAVENOUS | Status: AC
  Filled 2021-11-21: qty 100

## 2021-11-21 MED ORDER — OXYCODONE HCL 5 MG/5ML PO SOLN: 5.0000 mg | Freq: Once | ORAL | Status: DC | PRN

## 2021-11-21 MED ORDER — AMISULPRIDE (ANTIEMETIC) 5 MG/2ML IV SOLN: 10.0000 mg | Freq: Once | INTRAVENOUS | Status: DC | PRN

## 2021-11-21 MED ORDER — ONDANSETRON HCL 4 MG/2ML IJ SOLN: 4.0000 mg | Freq: Once | INTRAMUSCULAR | Status: DC | PRN

## 2021-11-21 MED ORDER — ONDANSETRON HCL 4 MG/2ML IJ SOLN
INTRAMUSCULAR | Status: AC
  Filled 2021-11-21: qty 2

## 2021-11-21 MED ORDER — OXYCODONE-ACETAMINOPHEN 5-325 MG PO TABS
1.0000 | ORAL_TABLET | Freq: Four times a day (QID) | ORAL | 0 refills | Status: AC | PRN
Start: 2021-11-21 — End: 2021-11-26

## 2021-11-21 MED ORDER — CLONIDINE HCL (ANALGESIA) 100 MCG/ML EP SOLN
EPIDURAL | Status: DC | PRN
  Administered 2021-11-21: 100 ug

## 2021-11-21 MED ORDER — MIDAZOLAM HCL 2 MG/2ML IJ SOLN
2.0000 mg | Freq: Once | INTRAMUSCULAR | Status: AC
  Administered 2021-11-21: 07:00:00 2 mg via INTRAVENOUS

## 2021-11-21 MED ORDER — PROPOFOL 500 MG/50ML IV EMUL
INTRAVENOUS | Status: AC
  Filled 2021-11-21: qty 50

## 2021-11-21 MED ORDER — HYDROMORPHONE HCL 1 MG/ML IJ SOLN: 0.2500 mg | INTRAMUSCULAR | Status: DC | PRN

## 2021-11-21 MED ORDER — OXYCODONE HCL 5 MG PO TABS: 5.0000 mg | ORAL_TABLET | Freq: Once | ORAL | Status: DC | PRN

## 2021-11-21 MED ORDER — PROPOFOL 500 MG/50ML IV EMUL
INTRAVENOUS | Status: DC | PRN
  Administered 2021-11-21: 125 ug/kg/min via INTRAVENOUS

## 2021-11-21 MED ORDER — CEFAZOLIN SODIUM-DEXTROSE 2-4 GM/100ML-% IV SOLN
INTRAVENOUS | Status: AC
  Filled 2021-11-21: qty 100

## 2021-11-21 MED ORDER — MIDAZOLAM HCL 2 MG/2ML IJ SOLN
INTRAMUSCULAR | Status: AC
  Filled 2021-11-21: qty 2

## 2021-11-21 MED ORDER — DEXMEDETOMIDINE (PRECEDEX) IN NS 20 MCG/5ML (4 MCG/ML) IV SYRINGE
PREFILLED_SYRINGE | INTRAVENOUS | Status: AC
  Filled 2021-11-21: qty 5

## 2021-11-21 MED ORDER — DEXMEDETOMIDINE (PRECEDEX) IN NS 20 MCG/5ML (4 MCG/ML) IV SYRINGE
PREFILLED_SYRINGE | INTRAVENOUS | Status: DC | PRN
  Administered 2021-11-21: 8 ug via INTRAVENOUS
  Administered 2021-11-21: 12 ug via INTRAVENOUS

## 2021-11-21 MED ORDER — CEFAZOLIN SODIUM-DEXTROSE 2-4 GM/100ML-% IV SOLN
2.0000 g | INTRAVENOUS | Status: AC
  Administered 2021-11-21: 08:00:00 2 g via INTRAVENOUS

## 2021-11-21 MED ORDER — FENTANYL CITRATE (PF) 100 MCG/2ML IJ SOLN
100.0000 ug | Freq: Once | INTRAMUSCULAR | Status: AC
  Administered 2021-11-21: 07:00:00 100 ug via INTRAVENOUS

## 2021-11-21 MED ORDER — ACETAMINOPHEN 10 MG/ML IV SOLN: 1000.0000 mg | Freq: Once | INTRAVENOUS | Status: DC | PRN

## 2021-11-21 MED ORDER — LIDOCAINE HCL 1 % IJ SOLN
INTRAMUSCULAR | Status: AC
  Filled 2021-11-21: qty 20

## 2021-11-21 MED ORDER — LACTATED RINGERS IV SOLN: INTRAVENOUS | Status: DC

## 2021-11-21 MED ORDER — FENTANYL CITRATE (PF) 100 MCG/2ML IJ SOLN
INTRAMUSCULAR | Status: AC
  Filled 2021-11-21: qty 2

## 2021-11-21 MED ORDER — ONDANSETRON HCL 4 MG/2ML IJ SOLN
INTRAMUSCULAR | Status: DC | PRN
  Administered 2021-11-21: 4 mg via INTRAVENOUS

## 2021-11-21 MED ORDER — BUPIVACAINE HCL (PF) 0.5 % IJ SOLN
INTRAMUSCULAR | Status: AC
  Filled 2021-11-21: qty 30

## 2021-11-21 SURGICAL SUPPLY — 53 items
BIT DRILL 110X2.5XQCK CNCT (BIT) IMPLANT
BIT DRILL 2.5 (BIT) ×2
BIT DRL 110X2.5XQCK CNCT (BIT) ×1
BLADE SURG 15 STRL LF DISP TIS (BLADE) ×2 IMPLANT
BLADE SURG 15 STRL SS (BLADE) ×4
BNDG CMPR 9X4 STRL LF SNTH (GAUZE/BANDAGES/DRESSINGS) ×1
BNDG ELASTIC 4X5.8 VLCR STR LF (GAUZE/BANDAGES/DRESSINGS) ×2 IMPLANT
BNDG ESMARK 4X9 LF (GAUZE/BANDAGES/DRESSINGS) ×2 IMPLANT
CORD BIPOLAR FORCEPS 12FT (ELECTRODE) ×2 IMPLANT
COVER BACK TABLE 60X90IN (DRAPES) ×2 IMPLANT
CUFF TOURN SGL QUICK 18X4 (TOURNIQUET CUFF) IMPLANT
CUFF TOURN SGL QUICK 24 (TOURNIQUET CUFF)
CUFF TRNQT CYL 24X4X16.5-23 (TOURNIQUET CUFF) IMPLANT
DECANTER SPIKE VIAL GLASS SM (MISCELLANEOUS) IMPLANT
DRAPE EXTREMITY T 121X128X90 (DISPOSABLE) ×2 IMPLANT
DRAPE OEC MINIVIEW 54X84 (DRAPES) ×2 IMPLANT
DRAPE SHEET LG 3/4 BI-LAMINATE (DRAPES) ×2 IMPLANT
DRAPE SURG 17X23 STRL (DRAPES) IMPLANT
GAUZE 4X4 16PLY ~~LOC~~+RFID DBL (SPONGE) ×2 IMPLANT
GAUZE SPONGE 4X4 12PLY STRL (GAUZE/BANDAGES/DRESSINGS) ×2 IMPLANT
GAUZE XEROFORM 1X8 LF (GAUZE/BANDAGES/DRESSINGS) ×2 IMPLANT
GLOVE SURG UNDER POLY LF SZ7.5 (GLOVE) ×4 IMPLANT
GOWN STRL REUS W/TWL LRG LVL3 (GOWN DISPOSABLE) ×4 IMPLANT
HIBICLENS CHG 4% 4OZ BTL (MISCELLANEOUS) ×2 IMPLANT
K-WIRE DBL END TROCAR 6X.045 (WIRE)
K-WIRE DBL END TROCAR 6X.062 (WIRE)
KIT TURNOVER CYSTO (KITS) ×2 IMPLANT
KWIRE DBL END TROCAR 6X.045 (WIRE) IMPLANT
KWIRE DBL END TROCAR 6X.062 (WIRE) IMPLANT
NDL HYPO 25X1 1.5 SAFETY (NEEDLE) IMPLANT
NEEDLE HYPO 25X1 1.5 SAFETY (NEEDLE) IMPLANT
NS IRRIG 1000ML POUR BTL (IV SOLUTION) ×2 IMPLANT
NS IRRIG 500ML POUR BTL (IV SOLUTION) ×2 IMPLANT
PACK BASIN DAY SURGERY FS (CUSTOM PROCEDURE TRAY) ×2 IMPLANT
PAD CAST 4YDX4 CTTN HI CHSV (CAST SUPPLIES) ×1 IMPLANT
PADDING CAST ABS 4INX4YD NS (CAST SUPPLIES) ×1
PADDING CAST ABS COTTON 4X4 ST (CAST SUPPLIES) ×1 IMPLANT
PADDING CAST COTTON 4X4 STRL (CAST SUPPLIES) ×2
PLATE 7 HOLE (Plate) ×1 IMPLANT
SCREW CORTICAL 3.5 16MM (Screw) ×2 IMPLANT
SCREW CORTICAL 3.5 18MM (Screw) ×4 IMPLANT
SLEEVE SCD COMPRESS KNEE MED (STOCKING) IMPLANT
SPONGE T-LAP 4X18 ~~LOC~~+RFID (SPONGE) ×2 IMPLANT
STRIP CLOSURE SKIN 1/2X4 (GAUZE/BANDAGES/DRESSINGS) ×2 IMPLANT
SUCTION FRAZIER HANDLE 10FR (MISCELLANEOUS)
SUCTION TUBE FRAZIER 10FR DISP (MISCELLANEOUS) IMPLANT
SUT ETHILON 4 0 PS 2 18 (SUTURE) ×2 IMPLANT
SYR 10ML LL (SYRINGE) IMPLANT
SYR BULB EAR ULCER 3OZ GRN STR (SYRINGE) ×2 IMPLANT
TOWEL OR 17X26 10 PK STRL BLUE (TOWEL DISPOSABLE) ×2 IMPLANT
TRAY DSU PREP LF (CUSTOM PROCEDURE TRAY) ×2 IMPLANT
TUBE CONNECTING 12X1/4 (SUCTIONS) IMPLANT
UNDERPAD 30X36 HEAVY ABSORB (UNDERPADS AND DIAPERS) ×2 IMPLANT

## 2021-11-21 NOTE — Interval H&P Note (Signed)
History and Physical Interval Note:  11/21/2021 7:28 AM  Michele Webster  has presented today for surgery, with the diagnosis of left ulnar shaft fracture.  The various methods of treatment have been discussed with the patient and family. After consideration of risks, benefits and other options for treatment, the patient has consented to  Procedure(s) with comments: OPEN REDUCTION INTERNAL FIXATION (ORIF) left ULNAR shaft  FRACTURE (Left) - with MAC, needs 60 minutes as a surgical intervention.  The patient's history has been reviewed, patient examined, no change in status, stable for surgery.  I have reviewed the patient's chart and labs.  Questions were answered to the patient's satisfaction.     Orene Desanctis

## 2021-11-21 NOTE — Transfer of Care (Signed)
Immediate Anesthesia Transfer of Care Note  Patient: Michele Webster  Procedure(s) Performed: OPEN REDUCTION INTERNAL FIXATION (ORIF) left ULNAR shaft  FRACTURE (Left: Arm Lower)  Patient Location: PACU  Anesthesia Type:MAC combined with regional for post-op pain  Level of Consciousness: awake, alert  and oriented  Airway & Oxygen Therapy: Patient Spontanous Breathing  Post-op Assessment: Report given to RN and Post -op Vital signs reviewed and stable  Post vital signs: Reviewed and stable  Last Vitals:  Vitals Value Taken Time  BP 117/82   Temp    Pulse 78   Resp 14   SpO2 95%     Last Pain:  Vitals:   11/21/21 0720  TempSrc:   PainSc: 0-No pain      Patients Stated Pain Goal: 5 (11/21/40 1464)  Complications: No notable events documented.

## 2021-11-21 NOTE — Discharge Instructions (Addendum)
Orthopaedic Hand Surgery Discharge Instructions  WEIGHT BEARING STATUS: Non weight bearing on operative extremity  DRESSINGS: Please keep your dressing/splint/cast clean and dry until your follow-up appointment. You may shower by placing a waterproof covering over your dressing/splint/cast. Contact your surgeon if your splint/cast gets wet. It will need to be changed to prevent skin breakdown.  PAIN CONTROL: First line medications for post operative pain control are Tylenol (acetaminophen) and Motrin (ibuprofen) if you are able to take these medications. If you have been prescribed a medication these can be taken as breakthrough pain medications. Please note that some narcotic pain medication have acetaminophen added and you should never consume more than 4,081m of acetaminophen in 24 hour period. Also please note that if you are given Toradol (ketoralac) you should not take similar medications simultaneously such as ibuprofen.   ICE/ELEVATION: Ice and elevate your injured extremity as needed. Avoid direct contact of ice with skin.  HOME MEDICATIONS: No changes have been made to your home medications.  FOLLOW UP: You will be called after surgery with an appointment date and time, however if you have not received a phone call within 3 days please call during regular office hours at [phone] schedule a post operative appointment.  Please Seek Medical Attention if: Call MD for: pain or pressure in chest, jaw, arm, back, neck  Call MD for: temperature greater than 101 F for more than 24 hours  Call MD for: difficulty breathing Call MD for: Incision redness, bleeding, drainage  Call MD for: palpitations or feeling that the heart is racing  Call MD for: increased swelling in arm, leg, ankle, or abdomen  Call MD for: lightheadedness, dizziness, fainting Go to ED or call 911 if: chest pain does not go away after 3 nitroglycerin doses taken 5 min apart  Go to ED or call 911 for: any  uncontrolled bleeding  Go to ED or call 911 if: unable to reach physician  Discharge Medications: Allergies as of 11/21/2021       Reactions   Sertraline Other (See Comments)   Makes her crazy   Ceclor [cefaclor] Hives, Nausea And Vomiting, Swelling        Medication List     TAKE these medications    FLUoxetine 20 MG capsule Commonly known as: PROZAC Take 1 capsule (20 mg total) by mouth daily.   gabapentin 100 MG capsule Commonly known as: Neurontin Take 1 capsule (100 mg total) by mouth 3 (three) times daily.   naproxen 375 MG tablet Commonly known as: NAPROSYN Take 1 tablet (375 mg total) by mouth 2 (two) times daily.   nicotine 21 mg/24hr patch Commonly known as: NICODERM CQ - dosed in mg/24 hours Place 1 patch (21 mg total) onto the skin daily. (may buy from over the Counter): For smoking cessation   oxyCODONE-acetaminophen 5-325 MG tablet Commonly known as: Percocet Take 1 tablet by mouth every 6 (six) hours as needed for up to 5 days for severe pain.   QUEtiapine 200 MG tablet Commonly known as: SEROQUEL Take 1 tablet (200 mg total) by mouth at bedtime. For mood control          JIzell [place] MD Orthopaedic Hand Surgeon [facility] Office number: [phone] [address], [place] [address]  Post Anesthesia Home Care Instructions  Activity: Get plenty of rest for the remainder of the day. A responsible individual must stay with you for 24 hours following the procedure.  For the next 24 hours,  DO NOT: -Drive a car -Paediatric nurse -Drink alcoholic beverages -Take any medication unless instructed by your physician -Make any legal decisions or sign important papers.  Meals: Start with liquid foods such as gelatin or soup. Progress to regular foods as tolerated. Avoid greasy, spicy, heavy foods. If nausea and/or vomiting occur, drink only clear liquids until the nausea and/or vomiting subsides. Call your physician if  vomiting continues.  Special Instructions/Symptoms: Your throat may feel dry or sore from the anesthesia or the breathing tube placed in your throat during surgery. If this causes discomfort, gargle with warm salt water. The discomfort should disappear within 24 hours.    Regional Anesthesia Blocks  1. Numbness or the inability to move the "blocked" extremity may last from 3-48 hours after placement. The length of time depends on the medication injected and your individual response to the medication. If the numbness is not going away after 48 hours, call your surgeon.  2. The extremity that is blocked will need to be protected until the numbness is gone and the  Strength has returned. Because you cannot feel it, you will need to take extra care to avoid injury. Because it may be weak, you may have difficulty moving it or using it. You may not know what position it is in without looking at it while the block is in effect.  3. For blocks in the legs and feet, returning to weight bearing and walking needs to be done carefully. You will need to wait until the numbness is entirely gone and the strength has returned. You should be able to move your leg and foot normally before you try and bear weight or walk. You will need someone to be with you when you first try to ensure you do not fall and possibly risk injury.  4. Bruising and tenderness at the needle site are common side effects and will resolve in a few days.  5. Persistent numbness or new problems with movement should be communicated to the surgeon.

## 2021-11-21 NOTE — Op Note (Signed)
OPERATIVE NOTE  DATE OF PROCEDURE: 11/21/2021  SURGEONS:  Primary: Orene Desanctis, MD  PREOPERATIVE DIAGNOSIS: left ulnar shaft fracture  POSTOPERATIVE DIAGNOSIS: Same  NAME OF PROCEDURE:   Left ulnar shaft open reduction internal fixation Left forearm 4 view radiographs with intraoperative interpretation  ANESTHESIA: Regional  SKIN PREPARATION: Hibiclens  ESTIMATED BLOOD LOSS: Minimal  IMPLANTS:  Implant Name Type Inv. Item Serial No. Manufacturer Lot No. LRB No. Used Action  7 Hole Dual Compression Plate     [employer]IMMER [employer]ON(ORTH,TRAU,BIO,SG)  Left 1 Implanted  SCREW CORTICAL 3.5 16MM - [id number] Screw SCREW CORTICAL 3.5 16MM  [employer]IM[employer]ON(ORTH,TRAU,BIO,SG)  Left 2 Implanted  SCREW CORTICAL 3.5 18MM - [id number] Screw SCREW CORTICAL 3.5 18MM  ZIMMER [employer]ON(ORTH,TRAU,BIO,SG)  Left 4 Implanted   INDICATIONS:  Michele Webster is a 45 y.o. female who has the above preoperative diagnosis. The patient has decided to proceed with surgical intervention.  Risks, benefits and alternatives of operative management were discussed including, but not limited to, risks of anesthesia complications, infection, pain, persistent symptoms, stiffness, need for future surgery.  The patient understands, agrees and elects to proceed with surgery.    DESCRIPTION OF PROCEDURE: The patient was met in the pre-operative area and their identity was verified.  The operative location and laterality was also verified and marked.  The patient was brought to the OR and was placed supine on the table.  After repeat patient identification with the operative team anesthesia was provided and the patient was prepped and draped in the usual sterile fashion.  A final timeout was performed verifying the correction patient, procedure, location and laterality.  Preoperative antibiotics were provided and then the left upper extremity was elevated and exsanguinated with an Esmarch and tourniquet inflated to 250 mmHg.  A longitudinal incision  was made over the superficial border of the left ulnar shaft.  Skin and subcutaneous tissues were divided.  Careful hemostasis was obtained with bipolar electrocautery.  Care was taken to protect the dorsal cutaneous ulnar branch of the ulnar nerve.  A full-thickness fascial incision was made over the superficial border of the ulna and dissection was made around the fracture site of the left ulna.  There was a comminuted oblique fracture with a butterfly fragment at the distal third of the ulnar shaft.  The fracture ends were cleaned and reduction was obtained with a reduction clamp.  A 7 hole 3 5 limited contact dynamic compression plate was applied and an dynamic compression fashion the fracture site was reduced and held in place with the plate and screws.  There was a total of 3 screws placed bicortically both proximal and distal.  These were nonlocking screws.  There is excellent purchase and no fracture gap identified.  C-arm fluoroscopy confirmed adequate reduction of the fracture and placement of the hardware.  4 view C-arm fluoroscopy shots were obtained and these radiographs confirmed the above findings with adequate length, alignment, rotation of the distal ulnar fracture.  This time the wound was thoroughly irrigated and closed in layers with 3-0 Vicryl and 4-0 running horizontal mattress absorbable suture.  A sterile soft dressing was applied followed by a sugar-tong splint. The tourniquet was deflated and the fingers were pink and warm and well-perfused at the end of the case.  The patient tolerated the procedure well and was awoken from anesthesia and brought to PACU for recovery in stable condition.  At the end of the case all counts were correct x2.   Matt Holmes, MD

## 2021-11-21 NOTE — Progress Notes (Signed)
Assisted Dr. Valma Cava with left, ultrasound guided, supraclavicular block. Side rails up, monitors on throughout procedure. See vital signs in flow sheet. Tolerated Procedure well.

## 2021-11-21 NOTE — Anesthesia Postprocedure Evaluation (Signed)
Anesthesia Post Note  Patient: Michele Webster  Procedure(s) Performed: OPEN REDUCTION INTERNAL FIXATION (ORIF) left ULNAR shaft  FRACTURE (Left: Arm Lower)     Patient location during evaluation: PACU Anesthesia Type: Regional Level of consciousness: awake and alert Pain management: pain level controlled Vital Signs Assessment: post-procedure vital signs reviewed and stable Respiratory status: spontaneous breathing, nonlabored ventilation, respiratory function stable and patient connected to nasal cannula oxygen Cardiovascular status: stable and blood pressure returned to baseline Postop Assessment: no apparent nausea or vomiting Anesthetic complications: no   No notable events documented.  Last Vitals:  Vitals:   11/21/21 0910 11/21/21 0930  BP: 117/85 129/83  Pulse: 70 74  Resp: 15 14  Temp:  (!) 36.1 C  SpO2: 94% 95%    Last Pain:  Vitals:   11/21/21 0930  TempSrc:   PainSc: 0-No pain                 Barnet Glasgow

## 2021-11-21 NOTE — Anesthesia Procedure Notes (Addendum)
°  Anesthesia Regional Block: Supraclavicular block   Pre-Anesthetic Checklist: , timeout performed,  Correct Patient, Correct Site, Correct Laterality,  Correct Procedure, Correct Position, site marked,  Risks and benefits discussed,  Surgical consent,  Pre-op evaluation,  At surgeon's request and post-op pain management  Laterality: Left  Prep: chloraprep       Needles:  Injection technique: Single-shot  Needle Type: Echogenic Needle     Needle Length: 5cm  Needle Gauge: 21     Additional Needles:   Procedures:,,,, ultrasound used (permanent image in chart),,    Narrative:  Start time: 11/21/2021 6:54 AM End time: 11/21/2021 7:00 AM Injection made incrementally with aspirations every 5 mL.  Performed by: Personally  Anesthesiologist: Barnet Glasgow, MD

## 2021-11-26 ENCOUNTER — Encounter (HOSPITAL_BASED_OUTPATIENT_CLINIC_OR_DEPARTMENT_OTHER): Payer: Self-pay | Admitting: Orthopedic Surgery

## 2021-12-09 ENCOUNTER — Telehealth (HOSPITAL_COMMUNITY): Payer: Medicaid Other | Admitting: Psychiatry

## 2021-12-17 ENCOUNTER — Encounter (HOSPITAL_COMMUNITY): Payer: Self-pay | Admitting: Psychiatry

## 2021-12-17 ENCOUNTER — Telehealth (INDEPENDENT_AMBULATORY_CARE_PROVIDER_SITE_OTHER): Payer: Medicaid Other | Admitting: Psychiatry

## 2021-12-17 DIAGNOSIS — F3132 Bipolar disorder, current episode depressed, moderate: Secondary | ICD-10-CM

## 2021-12-17 MED ORDER — QUETIAPINE FUMARATE 200 MG PO TABS: 200.0000 mg | ORAL_TABLET | Freq: Every day | ORAL | 2 refills | Status: DC

## 2021-12-17 MED ORDER — FLUOXETINE HCL 20 MG PO CAPS: 20.0000 mg | ORAL_CAPSULE | Freq: Every day | ORAL | 2 refills | Status: DC

## 2021-12-17 MED ORDER — GABAPENTIN 100 MG PO CAPS: 200.0000 mg | ORAL_CAPSULE | Freq: Three times a day (TID) | ORAL | 2 refills | Status: DC
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
## 2022-02-20 ENCOUNTER — Encounter (HOSPITAL_COMMUNITY): Payer: Self-pay

## 2022-02-20 ENCOUNTER — Ambulatory Visit (INDEPENDENT_AMBULATORY_CARE_PROVIDER_SITE_OTHER): Payer: Medicaid Other | Admitting: Licensed Clinical Social Worker

## 2022-02-20 ENCOUNTER — Encounter (HOSPITAL_COMMUNITY): Payer: Self-pay | Admitting: Licensed Clinical Social Worker

## 2022-02-20 DIAGNOSIS — F3181 Bipolar II disorder: Secondary | ICD-10-CM

## 2022-02-20 DIAGNOSIS — F1994 Other psychoactive substance use, unspecified with psychoactive substance-induced mood disorder: Secondary | ICD-10-CM | POA: Insufficient documentation

## 2022-02-20 NOTE — Plan of Care (Signed)
Pt agreeable to plan  ?

## 2022-02-20 NOTE — Plan of Care (Signed)
Signatures added to treatment plan  ?
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
## 2022-03-11 ENCOUNTER — Telehealth (HOSPITAL_COMMUNITY): Payer: Medicaid Other | Admitting: Psychiatry

## 2022-03-11 ENCOUNTER — Encounter (HOSPITAL_COMMUNITY): Payer: Self-pay

## 2022-03-23 ENCOUNTER — Emergency Department (HOSPITAL_COMMUNITY)
Admission: EM | Admit: 2022-03-23 | Discharge: 2022-03-23 | Discharge status: Against Medical Advice | Payer: Medicaid Other | Attending: Emergency Medicine | Admitting: Emergency Medicine

## 2022-03-23 ENCOUNTER — Encounter (HOSPITAL_COMMUNITY): Payer: Self-pay | Admitting: Emergency Medicine

## 2022-03-23 DIAGNOSIS — R1110 Vomiting, unspecified: Secondary | ICD-10-CM | POA: Insufficient documentation

## 2022-03-23 DIAGNOSIS — Z5321 Procedure and treatment not carried out due to patient leaving prior to being seen by health care provider: Secondary | ICD-10-CM | POA: Diagnosis not present

## 2022-03-23 DIAGNOSIS — R109 Unspecified abdominal pain: Secondary | ICD-10-CM | POA: Insufficient documentation

## 2022-03-23 LAB — COMPREHENSIVE METABOLIC PANEL
ALT: 20 U/L (ref 0–44)
AST: 39 U/L (ref 15–41)
Albumin: 3.4 g/dL — ABNORMAL LOW (ref 3.5–5.0)
Alkaline Phosphatase: 77 U/L (ref 38–126)
Anion gap: 4 — ABNORMAL LOW (ref 5–15)
BUN: 15 mg/dL (ref 6–20)
CO2: 23 mmol/L (ref 22–32)
Calcium: 8.5 mg/dL — ABNORMAL LOW (ref 8.9–10.3)
Chloride: 113 mmol/L — ABNORMAL HIGH (ref 98–111)
Creatinine, Ser: 0.82 mg/dL (ref 0.44–1.00)
GFR, Estimated: >60 mL/min (ref >60)
Glucose, Bld: 100 mg/dL — ABNORMAL HIGH (ref 70–99)
Potassium: 5.1 mmol/L (ref 3.5–5.1)
Sodium: 140 mmol/L (ref 135–145)
Total Bilirubin: 1.9 mg/dL — ABNORMAL HIGH (ref 0.3–1.2)
Total Protein: 6.5 g/dL (ref 6.5–8.1)

## 2022-03-23 LAB — CBC WITH DIFFERENTIAL/PLATELET
Abs Immature Granulocytes: 0.03 10*3/uL (ref 0.00–0.07)
Basophils Absolute: 0.1 10*3/uL (ref 0.0–0.1)
Basophils Relative: 1 %
Eosinophils Absolute: 0.2 10*3/uL (ref 0.0–0.5)
Eosinophils Relative: 2 %
HCT: 42.4 % (ref 36.0–46.0)
Hemoglobin: 14.1 g/dL (ref 12.0–15.0)
Immature Granulocytes: 0 %
Lymphocytes Relative: 17 %
Lymphs Abs: 1.5 10*3/uL (ref 0.7–4.0)
MCH: 30.9 pg (ref 26.0–34.0)
MCHC: 33.3 g/dL (ref 30.0–36.0)
MCV: 92.8 fL (ref 80.0–100.0)
Monocytes Absolute: 0.6 10*3/uL (ref 0.1–1.0)
Monocytes Relative: 7 %
Neutro Abs: 6.2 10*3/uL (ref 1.7–7.7)
Neutrophils Relative %: 73 %
Platelets: 389 10*3/uL (ref 150–400)
RBC: 4.57 MIL/uL (ref 3.87–5.11)
RDW: 13.4 % (ref 11.5–15.5)
WBC: 8.6 10*3/uL (ref 4.0–10.5)
nRBC: 0 % (ref 0.0–0.2)

## 2022-03-23 LAB — LIPASE, BLOOD: Lipase: 54 U/L — ABNORMAL HIGH (ref 11–51)

## 2022-03-23 NOTE — ED Notes (Signed)
Patient verbalizes understanding to make staff aware if she decides to leave for IV removal. ?

## 2022-03-23 NOTE — ED Notes (Signed)
Pt advises she cannot wait any longer and wants to leave ED. Pt left ED.  ?

## 2022-03-23 NOTE — ED Triage Notes (Signed)
Patient c/o  R flank pain x4 days. Reports vomiting x2 days. ?

## 2022-03-23 NOTE — ED Provider Triage Note (Signed)
Emergency Medicine Provider Triage Evaluation Note ? ?Michele [facility] , a 46 y.o. female  was evaluated in triage.  Pt complains of right flank pain.  Reports onset of pain initially in her back several days ago, bent over in the lower developed significantly worsening pain which now radiates along to her right lower abdomen.  Reports associated nausea, vomiting (a few days ago, not currently).  Denies fevers, chills, changes in bowel or bladder habits.  No history of kidney stones.  No prior abdominal surgeries. ? ?Review of Systems  ?Positive: Flank pain ?Negative: Fever, changes in bowel or bladder habits ? ?Physical Exam  ?BP (!) 147/95 (BP Location: Right Arm)   Pulse 79   Temp 99 ?F (37.2 ?C) (Oral)   Resp 18   SpO2 97%  ?Gen:   Awake, no distress   ?Resp:  Normal effort  ?MSK:   Moves extremities without difficulty  ?Other:  RLQ TTP ? ?Medical Decision Making  ?Medically screening exam initiated at 1:55 PM.  Appropriate orders placed.  Michele [facility] was informed that the remainder of the evaluation will be completed by another provider, this initial triage assessment does not replace that evaluation, and the importance of remaining in the ED until their evaluation is complete. ? ?Stone vs appendix vs msk source  ?  ?Tacy Learn, PA-C ?03/23/22 1358 ? ?

## 2022-03-25 ENCOUNTER — Encounter (HOSPITAL_COMMUNITY): Payer: Self-pay | Admitting: Psychiatry

## 2022-03-25 ENCOUNTER — Telehealth (INDEPENDENT_AMBULATORY_CARE_PROVIDER_SITE_OTHER): Payer: Medicaid Other | Admitting: Psychiatry

## 2022-03-25 DIAGNOSIS — F411 Generalized anxiety disorder: Secondary | ICD-10-CM | POA: Diagnosis not present

## 2022-03-25 DIAGNOSIS — F3132 Bipolar disorder, current episode depressed, moderate: Secondary | ICD-10-CM

## 2022-03-25 MED ORDER — GABAPENTIN 100 MG PO CAPS: 200.0000 mg | ORAL_CAPSULE | Freq: Three times a day (TID) | ORAL | 0 refills | Status: DC

## 2022-03-25 MED ORDER — QUETIAPINE FUMARATE 200 MG PO TABS: 200.0000 mg | ORAL_TABLET | Freq: Every day | ORAL | 2 refills | Status: DC

## 2022-03-25 MED ORDER — FLUOXETINE HCL 20 MG PO CAPS: 20.0000 mg | ORAL_CAPSULE | Freq: Every day | ORAL | 0 refills | Status: DC
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
## 2022-04-10 ENCOUNTER — Ambulatory Visit (INDEPENDENT_AMBULATORY_CARE_PROVIDER_SITE_OTHER): Payer: Medicaid Other | Admitting: Licensed Clinical Social Worker

## 2022-04-10 DIAGNOSIS — F3132 Bipolar disorder, current episode depressed, moderate: Secondary | ICD-10-CM

## 2022-04-10 NOTE — Progress Notes (Signed)
? ?  THERAPIST PROGRESS NOTE ? ?Virtual Visit via Telephone Note ? ?I connected with [facility] on 04/10/22 at  2:00 PM EDT by telephone and verified that I am speaking with the correct person using two identifiers. ? ?Location: ?Patient: [facility]  ?Provider: [facility]  ?  ?I discussed the limitations, risks, security and privacy concerns of performing an evaluation and management service by telephone and the availability of in person appointments. I also discussed with the patient that there may be a patient responsible charge related to this service. The patient expressed understanding and agreed to proceed. ? ?  ?I discussed the assessment and treatment plan with the patient. The patient was provided an opportunity to ask questions and all were answered. The patient agreed with the plan and demonstrated an understanding of the instructions. ?  ?The patient was advised to call back or seek an in-person evaluation if the symptoms worsen or if the condition fails to improve as anticipated. ? ?I provided 20 minutes of non-face-to-face time during this encounter. ? ? ?Dory Horn, LCSW  ? ?Participation Level: Active ? ?Behavioral Response: CasualAlertAnxious ? ?Type of Therapy: Individual Therapy ? ?Treatment Goals addressed: Transfer to [facility]  ? ?ProgressTowards Goals: Met ? ?Interventions: Solution Focused ? ?Summary: [facility] is a 46 y.o. female who presents with .  ? ?Suicidal/Homicidal: Nowithout intent/plan ? ?Therapist Response:  ? ?Pt was alert and oriented x 5. She was pleasant, cooperative and engaged well in therapy session. Pt presented with euthymic mood for telephone session.  ? Pt reports that everything has been going great. She is in the process of transferring to [facility] as she wants more frequent therapy other than 1 x monthly. LCSW did attempt for her to be seen to [facility] sister office but they were not taking on new patients. Pt reports  that she is very please with how the process has worked out.  ? Intervention/Plan : Solution focused for f/u with individual therapy more than 1 x monthly. Pt is now confired to be seen at [facility] 1 x weekly per patient. Pt denies SI/HI. Plan for pt to transfer to new therapist.   ?  ? ?Plan: transfer to [facility]  ? ?Diagnosis: No diagnosis found. ? ?Collaboration of Care: Other Transfer to [facility] with staying at [facility] for medication  ? ?Patient/Guardian was advised Release of Information must be obtained prior to any record release in order to collaborate their care with an outside provider. Patient/Guardian was advised if they have not already done so to contact the registration department to sign all necessary forms in order for [place] to release information regarding their care.  ? ?Consent: Patient/Guardian gives verbal consent for treatment and assignment of benefits for services provided during this visit. Patient/Guardian expressed understanding and agreed to proceed.  ? ?Dory Horn, LCSW ?04/10/2022 ? ?

## 2022-06-24 ENCOUNTER — Telehealth (INDEPENDENT_AMBULATORY_CARE_PROVIDER_SITE_OTHER): Payer: Medicaid Other | Admitting: Psychiatry

## 2022-06-24 ENCOUNTER — Encounter (HOSPITAL_COMMUNITY): Payer: Self-pay | Admitting: Psychiatry

## 2022-06-24 DIAGNOSIS — F3131 Bipolar disorder, current episode depressed, mild: Secondary | ICD-10-CM | POA: Diagnosis not present

## 2022-06-24 DIAGNOSIS — F411 Generalized anxiety disorder: Secondary | ICD-10-CM | POA: Diagnosis not present

## 2022-06-24 MED ORDER — GABAPENTIN 100 MG PO CAPS: 200.0000 mg | ORAL_CAPSULE | Freq: Three times a day (TID) | ORAL | 0 refills | Status: DC

## 2022-06-24 MED ORDER — FLUOXETINE HCL 40 MG PO CAPS: 40.0000 mg | ORAL_CAPSULE | Freq: Every day | ORAL | 0 refills | Status: DC

## 2022-06-24 MED ORDER — NALTREXONE HCL 50 MG PO TABS: 50.0000 mg | ORAL_TABLET | Freq: Every day | ORAL | 0 refills | Status: AC

## 2022-06-24 MED ORDER — QUETIAPINE FUMARATE 300 MG PO TABS: 300.0000 mg | ORAL_TABLET | Freq: Every day | ORAL | 0 refills | Status: DC
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
## 2022-09-08 ENCOUNTER — Other Ambulatory Visit (HOSPITAL_COMMUNITY): Payer: Self-pay | Admitting: Psychiatry

## 2022-10-30 ENCOUNTER — Other Ambulatory Visit: Payer: Self-pay

## 2022-10-30 ENCOUNTER — Emergency Department (HOSPITAL_COMMUNITY)
Admission: EM | Admit: 2022-10-30 | Discharge: 2022-10-30 | Disposition: A | Discharge status: Home / Self Care | Payer: Medicaid Other | Attending: Emergency Medicine | Admitting: Emergency Medicine

## 2022-10-30 ENCOUNTER — Encounter (HOSPITAL_COMMUNITY): Payer: Self-pay

## 2022-10-30 DIAGNOSIS — R21 Rash and other nonspecific skin eruption: Secondary | ICD-10-CM | POA: Diagnosis present

## 2022-10-30 DIAGNOSIS — L243 Irritant contact dermatitis due to cosmetics: Secondary | ICD-10-CM | POA: Diagnosis not present

## 2022-10-30 DIAGNOSIS — L509 Urticaria, unspecified: Secondary | ICD-10-CM | POA: Diagnosis not present

## 2022-10-30 MED ORDER — DIPHENHYDRAMINE HCL 25 MG PO CAPS
25.0000 mg | ORAL_CAPSULE | Freq: Once | ORAL | Status: AC
  Administered 2022-10-30: 25 mg via ORAL
  Filled 2022-10-30: qty 1

## 2022-10-30 MED ORDER — PREDNISONE 20 MG PO TABS: 40.0000 mg | ORAL_TABLET | Freq: Every day | ORAL | 0 refills | Status: AC

## 2022-10-30 MED ORDER — PREDNISONE 20 MG PO TABS
60.0000 mg | ORAL_TABLET | Freq: Once | ORAL | Status: AC
  Administered 2022-10-30: 60 mg via ORAL
  Filled 2022-10-30: qty 3

## 2022-10-30 MED ORDER — HYDROXYZINE HCL 25 MG PO TABS: 25.0000 mg | ORAL_TABLET | Freq: Four times a day (QID) | ORAL | 0 refills | Status: AC | PRN

## 2022-10-30 MED ORDER — FAMOTIDINE 20 MG PO TABS
20.0000 mg | ORAL_TABLET | Freq: Once | ORAL | Status: AC
  Administered 2022-10-30: 20 mg via ORAL
  Filled 2022-10-30: qty 1

## 2022-10-30 NOTE — Discharge Instructions (Signed)
I suspect her rash is from your new food.  Please stop using that this time.  You have been given a dose of steroids today, take the remainder of the course that is at your pharmacy.  Additionally, I sent hydroxyzine to your pharmacy which can be used for itching.  Do not take this with Benadryl.  If you do not pick up this medication you may use Benadryl instead.  Return with any worsening symptoms, otherwise you may see your PCP outpatient.  It was a pleasure to meet you and we hope you feel better!

## 2022-10-30 NOTE — ED Triage Notes (Addendum)
Pt reports with a red itchy rash all over her body since yesterday.

## 2022-10-30 NOTE — ED Provider Notes (Signed)
[facility] DEPT Provider Note   CSN: [id number] Arrival date & time: 10/30/22  1954     History  Chief Complaint  Patient presents with   Rash    Michele Webster is Webster 46 y.o. female reports 2 days of Webster erythematous and itchy rash to her body.  Reports that her cat slept with her 2 nights ago and has been scratching.  Additionally says that she used Webster new shampoo.  No known allergies.  Has not tried any medications   Rash      Home Medications Prior to Admission medications   Medication Sig Start Date End Date Taking? Authorizing Provider  hydrOXYzine (ATARAX) 25 MG tablet Take 1 tablet (25 mg total) by mouth every 6 (six) hours as needed for up to 14 days. 10/30/22 11/13/22 Yes Michele Meanor A, PA-C  predniSONE (DELTASONE) 20 MG tablet Take 2 tablets (40 mg total) by mouth daily for 4 days. 10/30/22 11/03/22 Yes Michele Mcneal A, PA-C  FLUoxetine (PROZAC) 20 MG capsule Take 1 capsule (20 mg total) by mouth daily. 03/25/22 06/23/22  Michele Webster  FLUoxetine (PROZAC) 40 MG capsule Take 1 capsule (40 mg total) by mouth daily. 06/24/22 09/22/22  Michele Webster  gabapentin (NEURONTIN) 100 MG capsule Take 2 capsules (200 mg total) by mouth 3 (three) times daily. 06/24/22 09/22/22  Michele Webster  naproxen (NAPROSYN) 375 MG tablet Take 1 tablet (375 mg total) by mouth 2 (two) times daily. 11/08/21   Michele Webster  nicotine (NICODERM CQ - DOSED IN MG/24 HOURS) 21 mg/24hr patch Place 1 patch (21 mg total) onto the skin daily. (may buy from over the Counter): For smoking cessation Patient not taking: Reported on 11/18/2021 04/22/21   Michele Spar Webster, Webster  QUEtiapine (SEROQUEL) 300 MG tablet TAKE 1 TABLET BY MOUTH EVERYDAY AT BEDTIME 09/13/22   Michele Webster      Allergies    Sertraline and Ceclor [cefaclor]    Review of Systems   Review of Systems  Skin:  Positive for rash.    Physical Exam Updated Vital Signs BP (!) 135/98  (BP Location: Left Arm)   Pulse 95   Temp 98.4 F (36.9 C) (Oral)   Resp 18   Ht 5\' 5"  (1.651 m)   Wt 96.2 kg   SpO2 98%   BMI 35.28 kg/m  Physical Exam Vitals and nursing note reviewed.  Constitutional:      Appearance: Normal appearance.  HENT:     Head: Normocephalic and atraumatic.  Eyes:     General: No scleral icterus.    Conjunctiva/sclera: Conjunctivae normal.  Pulmonary:     Effort: Pulmonary effort is normal. No respiratory distress.  Skin:    Findings: No rash.     Comments: Erythematous rash to patient's back, chest, bilateral upper extremities and thighs.  Neurological:     Mental Status: She is alert.  Psychiatric:        Mood and Affect: Mood normal.     ED Results / Procedures / Treatments   Labs (all labs ordered are listed, but only abnormal results are displayed) Labs Reviewed - No data to display  EKG None  Radiology No results found.  Procedures Procedures   Medications Ordered in ED Medications  predniSONE (DELTASONE) tablet 60 mg (60 mg Oral Given 10/30/22 2108)  famotidine (PEPCID) tablet 20 mg (20 mg Oral Given 10/30/22 2108)  diphenhydrAMINE (BENADRYL) capsule 25 mg (25  mg Oral Given 10/30/22 2108)    ED Course/ Medical Decision Making/ Webster&P                           Medical Decision Making Risk Prescription drug management.   46 year old female presenting today with Webster pruritic rash.  Has been going on for 2 days.  Of note she used Webster new shampoo around the same time that this started.  Differential includes but is not limited to contact dermatitis, fleas, varicella, allergic reaction.  Physical exam: Majority of patient's lower extremities are free of lesions, low suspicion fleas.  More consistent with Webster contact dermatitis throughout patient's body.  Treatment: Pepcid, Benadryl and prednisone.    MDM/disposition: Her rash is likely from the new shampoo.  She was instructed to stop using this.  Additionally, per chart review  she has an extensive history of drug use.  Always could be secondary to illicit drug however Webster have Webster higher suspicion of contact dermatitis.  Discharged with the remainder of her prednisone burst, hydroxyzine and return precautions.  Final Clinical Impression(s) / ED Diagnoses Final diagnoses:  Urticaria  Irritant contact dermatitis due to cosmetics    Rx / DC Orders ED Discharge Orders          Ordered    hydrOXYzine (ATARAX) 25 MG tablet  Every 6 hours PRN        10/30/22 2102    predniSONE (DELTASONE) 20 MG tablet  Daily        10/30/22 2102           Results and diagnoses were explained to the patient. Return precautions discussed in full. Patient had no additional questions and expressed complete understanding.   This chart was dictated using voice recognition software.  Despite best efforts to proofread,  errors can occur which can change the documentation meaning.     Michele Webster 10/30/22 2114    Michele Fines, Webster 10/30/22 2250

## 2022-11-08 ENCOUNTER — Encounter (HOSPITAL_COMMUNITY): Payer: Self-pay

## 2022-11-08 ENCOUNTER — Other Ambulatory Visit: Payer: Self-pay

## 2022-11-08 ENCOUNTER — Emergency Department (HOSPITAL_COMMUNITY)
Admission: EM | Admit: 2022-11-08 | Discharge: 2022-11-08 | Discharge status: Against Medical Advice | Payer: Medicaid Other | Attending: Emergency Medicine | Admitting: Emergency Medicine

## 2022-11-08 DIAGNOSIS — Z5321 Procedure and treatment not carried out due to patient leaving prior to being seen by health care provider: Secondary | ICD-10-CM | POA: Diagnosis not present

## 2022-11-08 DIAGNOSIS — R519 Headache, unspecified: Secondary | ICD-10-CM | POA: Insufficient documentation

## 2022-11-08 DIAGNOSIS — R21 Rash and other nonspecific skin eruption: Secondary | ICD-10-CM | POA: Diagnosis not present

## 2022-11-08 DIAGNOSIS — R0981 Nasal congestion: Secondary | ICD-10-CM | POA: Diagnosis present

## 2022-11-08 DIAGNOSIS — F1410 Cocaine abuse, uncomplicated: Secondary | ICD-10-CM | POA: Insufficient documentation

## 2022-11-08 DIAGNOSIS — R251 Tremor, unspecified: Secondary | ICD-10-CM | POA: Diagnosis not present

## 2022-11-08 LAB — CBC WITH DIFFERENTIAL/PLATELET
Abs Immature Granulocytes: 0.09 10*3/uL — ABNORMAL HIGH (ref 0.00–0.07)
Basophils Absolute: 0.1 10*3/uL (ref 0.0–0.1)
Basophils Relative: 1 %
Eosinophils Absolute: 0.1 10*3/uL (ref 0.0–0.5)
Eosinophils Relative: 1 %
HCT: 40.2 % (ref 36.0–46.0)
Hemoglobin: 13.2 g/dL (ref 12.0–15.0)
Immature Granulocytes: 1 %
Lymphocytes Relative: 10 %
Lymphs Abs: 1.4 10*3/uL (ref 0.7–4.0)
MCH: 31.1 pg (ref 26.0–34.0)
MCHC: 32.8 g/dL (ref 30.0–36.0)
MCV: 94.6 fL (ref 80.0–100.0)
Monocytes Absolute: 0.8 10*3/uL (ref 0.1–1.0)
Monocytes Relative: 6 %
Neutro Abs: 12.4 10*3/uL — ABNORMAL HIGH (ref 1.7–7.7)
Neutrophils Relative %: 81 %
Platelets: 438 10*3/uL — ABNORMAL HIGH (ref 150–400)
RBC: 4.25 MIL/uL (ref 3.87–5.11)
RDW: 13 % (ref 11.5–15.5)
WBC: 15 10*3/uL — ABNORMAL HIGH (ref 4.0–10.5)
nRBC: 0 % (ref 0.0–0.2)

## 2022-11-08 LAB — BASIC METABOLIC PANEL
Anion gap: 10 (ref 5–15)
BUN: 16 mg/dL (ref 6–20)
CO2: 24 mmol/L (ref 22–32)
Calcium: 9.4 mg/dL (ref 8.9–10.3)
Chloride: 106 mmol/L (ref 98–111)
Creatinine, Ser: 0.95 mg/dL (ref 0.44–1.00)
GFR, Estimated: >60 mL/min (ref >60)
Glucose, Bld: 132 mg/dL — ABNORMAL HIGH (ref 70–99)
Potassium: 4.3 mmol/L (ref 3.5–5.1)
Sodium: 140 mmol/L (ref 135–145)

## 2022-11-08 LAB — RAPID URINE DRUG SCREEN, HOSP PERFORMED
Amphetamines: NOT DETECTED
Barbiturates: NOT DETECTED
Benzodiazepines: NOT DETECTED
Cocaine: POSITIVE — AB
Opiates: NOT DETECTED
Tetrahydrocannabinol: NOT DETECTED

## 2022-11-08 LAB — MAGNESIUM: Magnesium: 2.2 mg/dL (ref 1.7–2.4)

## 2022-11-08 MED ORDER — DIAZEPAM 2 MG PO TABS
2.0000 mg | ORAL_TABLET | Freq: Once | ORAL | Status: AC
  Administered 2022-11-08: 2 mg via ORAL
  Filled 2022-11-08: qty 1

## 2022-11-08 NOTE — ED Provider Triage Note (Signed)
Emergency Medicine Provider Triage Evaluation Note  JAMILA SLATTEN , a 46 y.o. female  was evaluated in triage.  Pt complains of pain and full body twitching which is intermittent. Worse when sitting or lying still. Onset tonight while patient was trying to go to sleep. Seen a few days ago for a rash; started hydroxyzine and steroids. Tonight was the first time she took these with her daily medications. Does have a hx of restless leg. Denies substance use.  Review of Systems  Positive: As above Negative: As above  Physical Exam  Ht 5\' 5"  (1.651 m)   Wt 96.2 kg   BMI 35.28 kg/m  Gen:   Awake, no distress   Resp:  Normal effort  MSK:   Moves extremities without difficulty  Other:  No involuntary movements appreciated in triage.  Medical Decision Making  Medically screening exam initiated at 2:36 AM.  Appropriate orders placed.  SOPHY MESLER was informed that the remainder of the evaluation will be completed by another provider, this initial triage assessment does not replace that evaluation, and the importance of remaining in the ED until their evaluation is complete.  Muscle twitches - labs and UDS ordered.   Gerald Dexter, PA-C 11/08/22 14/09/23

## 2022-11-08 NOTE — ED Triage Notes (Signed)
Pt reports with twitches all over her body when she is lying still. Pt also states that she has congestion and a headache.

## 2022-11-08 NOTE — ED Notes (Signed)
Witnessed pt get into car and leaving the premises

## 2022-12-17 ENCOUNTER — Other Ambulatory Visit (HOSPITAL_COMMUNITY)
Admission: EM | Admit: 2022-12-17 | Discharge: 2022-12-21 | Disposition: A | Discharge status: Home / Self Care | Payer: Medicaid Other | Attending: Psychiatry | Admitting: Psychiatry

## 2022-12-17 DIAGNOSIS — Z1152 Encounter for screening for COVID-19: Secondary | ICD-10-CM | POA: Insufficient documentation

## 2022-12-17 DIAGNOSIS — F1910 Other psychoactive substance abuse, uncomplicated: Secondary | ICD-10-CM | POA: Diagnosis present

## 2022-12-17 DIAGNOSIS — F3181 Bipolar II disorder: Secondary | ICD-10-CM | POA: Insufficient documentation

## 2022-12-17 DIAGNOSIS — F1410 Cocaine abuse, uncomplicated: Secondary | ICD-10-CM

## 2022-12-17 DIAGNOSIS — F339 Major depressive disorder, recurrent, unspecified: Secondary | ICD-10-CM

## 2022-12-17 DIAGNOSIS — F1210 Cannabis abuse, uncomplicated: Secondary | ICD-10-CM

## 2022-12-17 DIAGNOSIS — F1510 Other stimulant abuse, uncomplicated: Secondary | ICD-10-CM

## 2022-12-17 LAB — POC URINE PREG, ED: Preg Test, Ur: NEGATIVE

## 2022-12-17 LAB — POCT URINE DRUG SCREEN - MANUAL ENTRY (I-SCREEN)
POC Amphetamine UR: POSITIVE — AB
POC Buprenorphine (BUP): POSITIVE — AB
POC Cocaine UR: POSITIVE — AB
POC Marijuana UR: POSITIVE — AB
POC Methadone UR: NOT DETECTED
POC Methamphetamine UR: POSITIVE — AB
POC Morphine: POSITIVE — AB
POC Oxazepam (BZO): POSITIVE — AB
POC Oxycodone UR: NOT DETECTED
POC Secobarbital (BAR): NOT DETECTED

## 2022-12-17 LAB — POC SARS CORONAVIRUS 2 AG: SARSCOV2ONAVIRUS 2 AG: NEGATIVE

## 2022-12-17 LAB — POCT PREGNANCY, URINE: Preg Test, Ur: NEGATIVE

## 2022-12-17 MED ORDER — NICOTINE 21 MG/24HR TD PT24
21.0000 mg | MEDICATED_PATCH | Freq: Every day | TRANSDERMAL | Status: DC
  Administered 2022-12-18 – 2022-12-21 (×4): 21 mg via TRANSDERMAL
  Filled 2022-12-17 (×4): qty 1

## 2022-12-17 MED ORDER — ALUM & MAG HYDROXIDE-SIMETH 200-200-20 MG/5ML PO SUSP: 30.0000 mL | ORAL | Status: DC | PRN

## 2022-12-17 MED ORDER — ACETAMINOPHEN 325 MG PO TABS
650.0000 mg | ORAL_TABLET | Freq: Four times a day (QID) | ORAL | Status: DC | PRN
  Administered 2022-12-19 (×2): 650 mg via ORAL
  Filled 2022-12-17 (×3): qty 2

## 2022-12-17 MED ORDER — MAGNESIUM HYDROXIDE 400 MG/5ML PO SUSP: 30.0000 mL | Freq: Every day | ORAL | Status: DC | PRN

## 2022-12-17 NOTE — ED Triage Notes (Signed)
Pt presents to [facility] voluntarily, unaccompanied at this time with complaint of depression and substance abuse relapse. Pt stated " my mom told me if I did not get help today she was done with me". Pt admits to having suicidal thoughts two days ago and began to cut her wrist, but her son stepped in and stopped her. Pt reports using crack, meth and alcohol last night and states she has had a very long history of using drugs. Pt stated diagnosis of Bipolar and anxiety and has not had medications since November of 2023. Pt was linked to therapist, but no longer attends sessions due to lack of transportation. Pt currently denies SI, HI, AVH.

## 2022-12-17 NOTE — ED Notes (Signed)
Pending 2 Hr Covid test and transfer to [facility].  Comfort measures given, meal given.

## 2022-12-17 NOTE — ED Provider Notes (Signed)
Facility Based Crisis Admission H&P  Date: 12/18/22 Patient Name: Michele Webster MRN: [id number] Chief Complaint:  Chief Complaint  Patient presents with   Depression   Addiction Problem      Diagnoses:  Final diagnoses:  Cocaine abuse ([place])  Methamphetamine abuse ([place])  Marijuana abuse  Recurrent major depressive disorder, remission status unspecified (HCC)    HPI: Michele Webster,  47 y/o female with a history of bipolar disorder, cocaine abuse, methamphetamines, general anxiety disorder, presented to [facility], voluntarily seeking help from polysubstance use.  Per the patient she has increased anxiety for the past couple of weeks and according to her she has not gotten out of bed in.  Patient states she lives at home with her mother but she is not supposed to be at her mom's apartment with her mom hide her so no one can see that she is there.  According to patient she has 3 children and her mom has been raising them.  According to patient she got out of prison in 2019 for larceny and it is difficult for her to get a job.  Patient stated she tries to get disability and food stamps but they keep turning her down.  Face-to-face observation of patient, patient is alert and oriented x 4, speech is clear maintain eye contact.  Patient denies SI, HI, AVH or paranoia.  According to patient she is trying to get clean and tried to stay good however when asked when the last time she smoked marijuana she says 3 weeks ago when asked when the last time she used cocaine and meth she says yesterday however patient stated but I am trying to stay good though patient is deflecting and does not seem to take responsibility when asked questions patient keeps stating that she cannot get any help from nobody and nobody is doing anything for her.  Patient is currently not seeing a psychiatrist or therapist and according to patient she is not taking any medications right now.  According to patient she [facility] and they had referred her somewhere but patient states she does not have a ride to get there.  Recommend FBC  PHQ 2-9:  Health and safety inspector from 02/20/2022 in [facility] Video Visit from 09/09/2021 in [facility] Counselor from 03/11/2021 in [facility]  Thoughts that you would be better off dead, or of hurting yourself in some way Several days Not at all Several days  PHQ-9 Total Score _0 Flowsheet Row ED from 12/17/2022 in [facility] ED from 11/08/2022 in [place] [facility] ED from 10/30/2022 in [place] [facility]  C-SSRS RISK CATEGORY High Risk No Risk No Risk        Total Time spent with patient: 20 minutes  Musculoskeletal  Strength & Muscle Tone: within normal limits Gait & Station: normal Patient leans: N/A  Psychiatric Specialty Exam  Presentation General Appearance:  Casual  Eye Contact: Good  Speech: Clear and Coherent  Speech Volume: Normal  Handedness: Right   Mood and Affect  Mood: Anxious; Worthless; Hopeless  Affect: Congruent   Thought Process  Thought Processes: Coherent  Descriptions of Associations:Intact  Orientation:Full (Time, Place and Person)  Thought Content:WDL  Diagnosis of Schizophrenia or Schizoaffective disorder in past: No   Hallucinations:Hallucinations: None  Ideas of Reference:None  Suicidal Thoughts:Suicidal Thoughts: No  Homicidal Thoughts:Homicidal  Thoughts: No   Sensorium  Memory: Immediate Fair  Judgment: Poor  Insight: Fair   Materials engineer: Fair  Attention Span: Fair  Recall: [employer] of Knowledge: Fair  Language: Fair   Psychomotor Activity  Psychomotor Activity: Psychomotor Activity: Normal   Assets  Assets: Desire for Improvement; Housing;  Talents/Skills   Sleep  Sleep: Sleep: Fair Number of Hours of Sleep: 6   Nutritional Assessment (For OBS and FBC admissions only) Has the patient had a weight loss or gain of 10 pounds or more in the last 3 months?: No Has the patient had a decrease in food intake/or appetite?: No Does the patient have dental problems?: No Does the patient have eating habits or behaviors that may be indicators of an eating disorder including binging or inducing vomiting?: No Has the patient recently lost weight without trying?: 0 Has the patient been eating poorly because of a decreased appetite?: 0 Malnutrition Screening Tool Score: 0    Physical Exam HENT:     Head: Normocephalic.     Nose: Nose normal.  Cardiovascular:     Rate and Rhythm: Normal rate.  Pulmonary:     Effort: Pulmonary effort is normal.  Musculoskeletal:        General: Normal range of motion.     Cervical back: Normal range of motion.  Neurological:     General: No focal deficit present.     Mental Status: She is alert.  Psychiatric:        Mood and Affect: Mood normal.        Behavior: Behavior normal.        Thought Content: Thought content normal.        Judgment: Judgment normal.    Review of Systems  Constitutional: Negative.   HENT: Negative.    Eyes: Negative.   Respiratory: Negative.    Cardiovascular: Negative.   Gastrointestinal: Negative.   Genitourinary: Negative.   Musculoskeletal: Negative.   Skin: Negative.   Neurological: Negative.   Endo/Heme/Allergies: Negative.   Psychiatric/Behavioral:  Positive for depression and substance abuse. The patient is nervous/anxious.     Blood pressure (!) 91/46, pulse (!) 120, temperature 98.9 F (37.2 C), temperature source Oral, resp. rate 20, SpO2 98 %. There is no height or weight on file to calculate BMI.  Past Psychiatric History: Depression, bipolar disorder, polysubstance use abuse, insomnia.  Is the patient at risk to self? No  Has the patient  been a risk to self in the past 6 months? No .    Has the patient been a risk to self within the distant past? No   Is the patient a risk to others? No   Has the patient been a risk to others in the past 6 months? No   Has the patient been a risk to others within the distant past? 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[employer])    Smokers' cough ([place])    11-18-2021 per pt morning productive cough   Ulnar shaft fracture 11/08/2021   left    Past  Surgical History:  Procedure Laterality Date   ORIF ULNAR FRACTURE Left 11/21/2021   Procedure: OPEN REDUCTION INTERNAL FIXATION (ORIF) left ULNAR shaft  FRACTURE;  Surgeon: Orene Desanctis, MD;  Location: [facility];  Service: Orthopedics;  Laterality: Left;  with MAC, needs 60 minutes   TIBIA IM NAIL INSERTION Right 12/31/2017   Procedure: INTRAMEDULLARY (IM) NAIL TIBIAL;  Surgeon: Dorna Leitz, MD;  Location: [facility];  Service: Orthopedics;  Laterality: Right;   TUBAL LIGATION Bilateral 11/13/2006   _0 ;   PPTL    Family History:  Family History  Problem Relation Age of Onset   Cancer Maternal Grandmother        breast    Social History:  Social History   Socioeconomic History   Marital status: Divorced    Spouse name: Not on file   Number of children: Not on file   Years of education: Not on file   Highest education level: Not on file  Occupational History   Not on file  Tobacco Use   Smoking status: Every Day    Packs/day: 1.00    Years: 35.00    Total pack years: 35.00    Types: Cigarettes   Smokeless tobacco: Never  Vaping Use   Vaping Use:  Never used  Substance and Sexual Activity   Alcohol use: Not Currently    Comment: recovering alchohlic.   Drug use: Yes    Frequency: 7.0 times per week    Types: Marijuana, Cocaine    Comment: crack almost every day 0.5 grams. Weed: 1 blunt per day   Sexual activity: Yes    Partners: Male    Birth control/protection: Surgical  Other Topics Concern   Not on file  Social History Narrative   ** Merged History Encounter **       Social Determinants of Health   Financial Resource Strain: Low Risk  (03/11/2021)   Overall Financial Resource Strain (CARDIA)    Difficulty of Paying Living Expenses: Not hard at all  Food Insecurity: No Food Insecurity (03/11/2021)   Hunger Vital Sign    Worried About Running Out of Food in the Last Year: Never true    Ran Out of Food in the Last Year: Never true  Transportation Needs: No Transportation Needs (03/11/2021)   PRAPARE - Hydrologist (Medical): No    Lack of Transportation (Non-Medical): No  Physical Activity: Sufficiently Active (03/11/2021)   Exercise Vital Sign    Days of Exercise per Week: 7 days    Minutes of Exercise per Session: 30 min  Stress: Stress Concern Present (02/20/2022)   [place]    Feeling of Stress : Very much  Social Connections: Socially Isolated (03/11/2021)   Social Connection and Isolation Panel [NHANES]    Frequency of Communication with Friends and Family: Once a week    Frequency of Social Gatherings with Friends and Family: Once a week    Attends Religious Services: Never    Marine scientist or Organizations: No    Attends Archivist Meetings: Never    Marital Status: Divorced  Human resources officer Violence: Not At Risk (03/11/2021)   Humiliation, Afraid, Rape, and Kick questionnaire    Fear of Current or Ex-Partner: No    Emotionally Abused: No    Physically Abused: No    Sexually Abused: No     SDOH:  SDOH Screenings   Food Insecurity: No  Food Insecurity (03/11/2021)  Housing: Low Risk  (03/11/2021)  Transportation Needs: No Transportation Needs (03/11/2021)  Alcohol Screen: Low Risk  (04/18/2021)  Depression (PHQ2-9): High Risk (02/20/2022)  Financial Resource Strain: Low Risk  (03/11/2021)  Physical Activity: Sufficiently Active (03/11/2021)  Social Connections: Socially Isolated (03/11/2021)  Stress: Stress Concern Present (02/20/2022)  Tobacco Use: High Risk (11/08/2022)    Last Labs:  Admission on 12/17/2022  Component Date Value Ref Range Status   SARS Coronavirus 2 by RT PCR 12/17/2022 NEGATIVE  NEGATIVE Final   Comment: (NOTE) SARS-CoV-2 target nucleic acids are NOT DETECTED.  The SARS-CoV-2 RNA is generally detectable in upper respiratory specimens during the acute phase of infection. The lowest concentration of SARS-CoV-2 viral copies this assay can detect is 138 copies/mL. A negative result does not preclude SARS-Cov-2 infection and should not be used as the sole basis for treatment or other patient management decisions. A negative result may occur with  improper specimen collection/handling, submission of specimen other than nasopharyngeal swab, presence of viral mutation(s) within the areas targeted by this assay, and inadequate number of viral copies(<138 copies/mL). A negative result must be combined with clinical observations, patient history, and epidemiological information. The expected result is Negative.  Fact Sheet for Patients:  EntrepreneurPulse.com.au  Fact Sheet for Healthcare Providers:  IncredibleEmployment.be  This test is no                          t yet approved or cleared by the [place] FDA and  has been authorized for detection and/or diagnosis of SARS-CoV-2 by FDA under an Emergency Use Authorization (EUA). This EUA will remain  in effect (meaning this test can be used) for the duration of  the COVID-19 declaration under Section 564(b)(1) of the Act, 21 U.S.C.section 360bbb-3(b)(1), unless the authorization is terminated  or revoked sooner.       Influenza A by PCR 12/17/2022 NEGATIVE  NEGATIVE Final   Influenza B by PCR 12/17/2022 NEGATIVE  NEGATIVE Final   Comment: (NOTE) The Xpert Xpress SARS-CoV-2/FLU/RSV plus assay is intended as an aid in the diagnosis of influenza from Nasopharyngeal swab specimens and should not be used as a sole basis for treatment. Nasal washings and aspirates are unacceptable for Xpert Xpress SARS-CoV-2/FLU/RSV testing.  Fact Sheet for Patients: EntrepreneurPulse.com.au  Fact Sheet for Healthcare Providers: IncredibleEmployment.be  This test is not yet approved or cleared by the [place] FDA and has been authorized for detection and/or diagnosis of SARS-CoV-2 by FDA under an Emergency Use Authorization (EUA). This EUA will remain in effect (meaning this test can be used) for the duration of the COVID-19 declaration under Section 564(b)(1) of the Act, 21 U.S.C. section 360bbb-3(b)(1), unless the authorization is terminated or revoked.     Resp Syncytial Virus by PCR 12/17/2022 NEGATIVE  NEGATIVE Final   Comment: (NOTE) Fact Sheet for Patients: EntrepreneurPulse.com.au  Fact Sheet for Healthcare Providers: IncredibleEmployment.be  This test is not yet approved or cleared by the [place] FDA and has been authorized for detection and/or diagnosis of SARS-CoV-2 by FDA under an Emergency Use Authorization (EUA). This EUA will remain in effect (meaning this test can be used) for the duration of the COVID-19 declaration under Section 564(b)(1) of the Act, 21 U.S.C. section 360bbb-3(b)(1), unless the authorization is terminated or revoked.  Performed at [facility], [place] [address] [place], [place] [address]    WBC 12/17/2022 11.2 (H)  4.0 - 10.5  K/uL Final   RBC 12/17/2022 4.44  3.87 - 5.11 MIL/uL Final   Hemoglobin 12/17/2022 13.7  12.0 - 15.0 g/dL Final   HCT 12/17/2022 40.4  36.0 - 46.0 % Final   MCV 12/17/2022 91.0  80.0 - 100.0 fL Final   MCH 12/17/2022 30.9  26.0 - 34.0 pg Final   MCHC 12/17/2022 33.9  30.0 - 36.0 g/dL Final   RDW 12/17/2022 13.2  11.5 - 15.5 % Final   Platelets 12/17/2022 397  150 - 400 K/uL Final   nRBC 12/17/2022 0.0  0.0 - 0.2 % Final   Neutrophils Relative % 12/17/2022 73  % Final   Neutro Abs 12/17/2022 8.3 (H)  1.7 - 7.7 K/uL Final   Lymphocytes Relative 12/17/2022 18  % Final   Lymphs Abs 12/17/2022 2.0  0.7 - 4.0 K/uL Final   Monocytes Relative 12/17/2022 7  % Final   Monocytes Absolute 12/17/2022 0.7  0.1 - 1.0 K/uL Final   Eosinophils Relative 12/17/2022 1  % Final   Eosinophils Absolute 12/17/2022 0.1  0.0 - 0.5 K/uL Final   Basophils Relative 12/17/2022 1  % Final   Basophils Absolute 12/17/2022 0.1  0.0 - 0.1 K/uL Final   Immature Granulocytes 12/17/2022 0  % Final   Abs Immature Granulocytes 12/17/2022 0.02  0.00 - 0.07 K/uL Final   Performed at [facility], [place] [address] [place], [place] [address]   Sodium 12/17/2022 140  135 - 145 mmol/L Final   Potassium 12/17/2022 3.7  3.5 - 5.1 mmol/L Final   Chloride 12/17/2022 104  98 - 111 mmol/L Final   CO2 12/17/2022 24  22 - 32 mmol/L Final   Glucose, Bld 12/17/2022 132 (H)  70 - 99 mg/dL Final   Glucose reference range applies only to samples taken after fasting for at least 8 hours.   BUN 12/17/2022 13  6 - 20 mg/dL Final   Creatinine, Ser 12/17/2022 0.84  0.44 - 1.00 mg/dL Final   Calcium 12/17/2022 9.9  8.9 - 10.3 mg/dL Final   Total Protein 12/17/2022 7.2  6.5 - 8.1 g/dL Final   Albumin 12/17/2022 4.1  3.5 - 5.0 g/dL Final   AST 12/17/2022 17  15 - 41 U/L Final   ALT 12/17/2022 18  0 - 44 U/L Final   Alkaline Phosphatase 12/17/2022 83  38 - 126 U/L Final   Total Bilirubin 12/17/2022 0.8  0.3 - 1.2 mg/dL Final   GFR,  Estimated 12/17/2022 >60  >60 mL/min Final   Comment: (NOTE) Calculated using the CKD-EPI Creatinine Equation (2021)    Anion gap 12/17/2022 12  5 - 15 Final   Performed at [facility], [place] [address], [place], [place] [address]   Cholesterol 12/17/2022 229 (H)  0 - 200 mg/dL Final   Triglycerides 12/17/2022 139  <150 mg/dL Final   HDL 12/17/2022 64  >40 mg/dL Final   Total CHOL/HDL Ratio 12/17/2022 3.6  RATIO Final   VLDL 12/17/2022 28  0 - 40 mg/dL Final   LDL Cholesterol 12/17/2022 137 (H)  0 - 99 mg/dL Final   Comment:        Total Cholesterol/HDL:CHD Risk Coronary Heart Disease Risk Table                     Men   Women  1/2 Average Risk   3.4   3.3  Average Risk       5.0   4.4  2 X Average Risk   9.6   7.1  3 X Average Risk  23.4   11.0        Use the calculated Patient Ratio above and the CHD Risk Table to determine the patient's CHD Risk.        ATP III CLASSIFICATION (LDL):  <100     mg/dL   Optimal  100-129  mg/dL   Near or Above                    Optimal  130-159  mg/dL   Borderline  160-189  mg/dL   High  >190     mg/dL   Very High Performed at [place] [address] [place], [place] [address]    TSH 12/17/2022 2.295  0.350 - 4.500 uIU/mL Final   Comment: Performed by a 3rd Generation assay with a functional sensitivity of <=0.01 uIU/mL. Performed at [facility], [place] [address] [place], [place] [address]    Preg Test, Ur 12/17/2022 Negative  Negative Preliminary   POC Amphetamine UR 12/17/2022 Positive (A)  NONE DETECTED (Cut Off Level 1000 ng/mL) Preliminary   POC Secobarbital (BAR) 12/17/2022 None Detected  NONE DETECTED (Cut Off Level 300 ng/mL) Preliminary   POC Buprenorphine (BUP) 12/17/2022 Positive (A)  NONE DETECTED (Cut Off Level 10 ng/mL) Preliminary   POC Oxazepam (BZO) 12/17/2022 Positive (A)  NONE DETECTED (Cut Off Level 300 ng/mL) Preliminary   POC Cocaine UR 12/17/2022 Positive (A)  NONE DETECTED (Cut Off Level 300  ng/mL) Preliminary   POC Methamphetamine UR 12/17/2022 Positive (A)  NONE DETECTED (Cut Off Level 1000 ng/mL) Preliminary   POC Morphine 12/17/2022 Positive (A)  NONE DETECTED (Cut Off Level 300 ng/mL) Preliminary   POC Methadone UR 12/17/2022 None Detected  NONE DETECTED (Cut Off Level 300 ng/mL) Preliminary   POC Oxycodone UR 12/17/2022 None Detected  NONE DETECTED (Cut Off Level 100 ng/mL) Preliminary   POC Marijuana UR 12/17/2022 Positive (A)  NONE DETECTED (Cut Off Level 50 ng/mL) Preliminary   SARSCOV2ONAVIRUS 2 AG 12/17/2022 NEGATIVE  NEGATIVE Final   Comment: (NOTE) SARS-CoV-2 antigen NOT DETECTED.   Negative results are presumptive.  Negative results do not preclude SARS-CoV-2 infection and should not be used as the sole basis for treatment or other patient management decisions, including infection  control decisions, particularly in the presence of clinical signs and  symptoms consistent with COVID-19, or in those who have been in contact with the virus.  Negative results must be combined with clinical observations, patient history, and epidemiological information. The expected result is Negative.  Fact Sheet for Patients: HandmadeRecipes.com.cy  Fact Sheet for Healthcare Providers: FuneralLife.at  This test is not yet approved or cleared by the [place] FDA and  has been authorized for detection and/or diagnosis of SARS-CoV-2 by FDA under an Emergency Use Authorization (EUA).  This EUA will remain in effect (meaning this test can be used) for the duration of  the COV                          ID-19 declaration under Section 564(b)(1) of the Act, 21 U.S.C. section 360bbb-3(b)(1), unless the authorization is terminated or revoked sooner.     Preg Test, Ur 12/17/2022 NEGATIVE  NEGATIVE Final   Comment:        THE SENSITIVITY OF THIS METHODOLOGY IS >24 mIU/mL   Admission on 11/08/2022, Discharged on 11/08/2022  Component  Date Value Ref Range Status   WBC 11/08/2022 15.0 (H)  4.0 - 10.5 K/uL Final   RBC 11/08/2022 4.25  3.87 - 5.11 MIL/uL Final   Hemoglobin 11/08/2022 13.2  12.0 - 15.0 g/dL Final   HCT 11/08/2022 40.2  36.0 - 46.0 % Final   MCV 11/08/2022 94.6  80.0 - 100.0 fL Final   MCH 11/08/2022 31.1  26.0 - 34.0 pg Final   MCHC 11/08/2022 32.8  30.0 - 36.0 g/dL Final   RDW 11/08/2022 13.0  11.5 - 15.5 % Final   Platelets 11/08/2022 438 (H)  150 - 400 K/uL Final   nRBC 11/08/2022 0.0  0.0 - 0.2 % Final   Neutrophils Relative % 11/08/2022 81  % Final   Neutro Abs 11/08/2022 12.4 (H)  1.7 - 7.7 K/uL Final   Lymphocytes Relative 11/08/2022 10  % Final   Lymphs Abs 11/08/2022 1.4  0.7 - 4.0 K/uL Final   Monocytes Relative 11/08/2022 6  % Final   Monocytes Absolute 11/08/2022 0.8  0.1 - 1.0 K/uL Final   Eosinophils Relative 11/08/2022 1  % Final   Eosinophils Absolute 11/08/2022 0.1  0.0 - 0.5 K/uL Final   Basophils Relative 11/08/2022 1  % Final   Basophils Absolute 11/08/2022 0.1  0.0 - 0.1 K/uL Final   Immature Granulocytes 11/08/2022 1  % Final   Abs Immature Granulocytes 11/08/2022 0.09 (H)  0.00 - 0.07 K/uL Final   Performed at [place] [address], [place], [place] [address]   Sodium 11/08/2022 140  135 - 145 mmol/L Final   Potassium 11/08/2022 4.3  3.5 - 5.1 mmol/L Final   Chloride 11/08/2022 106  98 - 111 mmol/L Final   CO2 11/08/2022 24  22 - 32 mmol/L Final   Glucose, Bld 11/08/2022 132 (H)  70 - 99 mg/dL Final   Glucose reference range applies only to samples taken after fasting for at least 8 hours.   BUN 11/08/2022 16  6 - 20 mg/dL Final   Creatinine, Ser 11/08/2022 0.95  0.44 - 1.00 mg/dL Final   Calcium 11/08/2022 9.4  8.9 - 10.3 mg/dL Final   GFR, Estimated 11/08/2022 >60  >60 mL/min Final   Comment: (NOTE) Calculated using the CKD-EPI Creatinine Equation (2021)    Anion gap 11/08/2022 10  5 - 15 Final   Performed at [facility], [place] [address], [place], [place] [address]   Magnesium 11/08/2022 2.2  1.7 - 2.4 mg/dL Final   Performed at [place] [address], [place], [place] [address]   Opiates 11/08/2022 NONE DETECTED  NONE DETECTED Final   Cocaine 11/08/2022 POSITIVE (A)  NONE DETECTED Final   Benzodiazepines 11/08/2022 NONE DETECTED  NONE DETECTED Final   Amphetamines 11/08/2022 NONE DETECTED  NONE DETECTED Final   Tetrahydrocannabinol 11/08/2022 NONE DETECTED  NONE DETECTED Final   Barbiturates 11/08/2022 NONE DETECTED  NONE DETECTED Final   Comment: (NOTE) DRUG SCREEN FOR MEDICAL PURPOSES ONLY.  IF CONFIRMATION IS NEEDED FOR ANY PURPOSE, NOTIFY LAB WITHIN 5 DAYS.  LOWEST DETECTABLE LIMITS FOR URINE DRUG SCREEN Drug Class                     Cutoff (ng/mL) Amphetamine and metabolites    1000 Barbiturate and metabolites    200 Benzodiazepine                 200 Opiates and metabolites  300 Cocaine and metabolites        300 THC                            50 Performed at [facility], [place] [address] [place], [place] [address]     Allergies: Sertraline and Ceclor [cefaclor]  PTA Medications: (Not in a hospital admission)   Long Term Goals: Improvement in symptoms so as ready for discharge  Short Term Goals: Patient will verbalize feelings in meetings with treatment team members., Patient will attend at least of 50% of the groups daily., Pt will complete the PHQ9 on admission, day 3 and discharge., Patient will participate in completing the [place], and Patient will score a low risk of violence for 24 hours prior to discharge  Medical Decision Making  Inpatient [facility] Lab Orders         Resp panel by RT-PCR (RSV, Flu A&B, Covid) Anterior Nasal Swab         CBC with Differential/Platelet         Comprehensive metabolic panel         Hemoglobin A1c         Lipid panel         TSH         POC urine preg, ED         POCT  Urine Drug Screen - (I-Screen)         POC SARS Coronavirus 2 Ag         Pregnancy, urine POC      Meds ordered this encounter  Medications   acetaminophen (TYLENOL) tablet 650 mg   alum & mag hydroxide-simeth (MAALOX/MYLANTA) 200-200-20 MG/5ML suspension 30 mL   magnesium hydroxide (MILK OF MAGNESIA) suspension 30 mL   nicotine (NICODERM CQ - dosed in mg/24 hours) patch 21 mg   QUEtiapine (SEROQUEL) tablet 300 mg       Recommendations  Based on my evaluation the patient appears to have an emergency medical condition for which I recommend the patient be transferred to the emergency department for further evaluation.  Evette Georges, NP 12/18/22  5:58 AM

## 2022-12-17 NOTE — ED Notes (Signed)
[facility] called for stat labs  

## 2022-12-18 DIAGNOSIS — F1210 Cannabis abuse, uncomplicated: Secondary | ICD-10-CM | POA: Diagnosis not present

## 2022-12-18 DIAGNOSIS — F1410 Cocaine abuse, uncomplicated: Secondary | ICD-10-CM | POA: Diagnosis not present

## 2022-12-18 DIAGNOSIS — F3181 Bipolar II disorder: Secondary | ICD-10-CM | POA: Diagnosis not present

## 2022-12-18 DIAGNOSIS — F1510 Other stimulant abuse, uncomplicated: Secondary | ICD-10-CM | POA: Diagnosis not present

## 2022-12-18 LAB — CBC WITH DIFFERENTIAL/PLATELET
Abs Immature Granulocytes: 0.02 10*3/uL (ref 0.00–0.07)
Basophils Absolute: 0.1 10*3/uL (ref 0.0–0.1)
Basophils Relative: 1 %
Eosinophils Absolute: 0.1 10*3/uL (ref 0.0–0.5)
Eosinophils Relative: 1 %
HCT: 40.4 % (ref 36.0–46.0)
Hemoglobin: 13.7 g/dL (ref 12.0–15.0)
Immature Granulocytes: 0 %
Lymphocytes Relative: 18 %
Lymphs Abs: 2 10*3/uL (ref 0.7–4.0)
MCH: 30.9 pg (ref 26.0–34.0)
MCHC: 33.9 g/dL (ref 30.0–36.0)
MCV: 91 fL (ref 80.0–100.0)
Monocytes Absolute: 0.7 10*3/uL (ref 0.1–1.0)
Monocytes Relative: 7 %
Neutro Abs: 8.3 10*3/uL — ABNORMAL HIGH (ref 1.7–7.7)
Neutrophils Relative %: 73 %
Platelets: 397 10*3/uL (ref 150–400)
RBC: 4.44 MIL/uL (ref 3.87–5.11)
RDW: 13.2 % (ref 11.5–15.5)
WBC: 11.2 10*3/uL — ABNORMAL HIGH (ref 4.0–10.5)
nRBC: 0 % (ref 0.0–0.2)

## 2022-12-18 LAB — COMPREHENSIVE METABOLIC PANEL
ALT: 18 U/L (ref 0–44)
AST: 17 U/L (ref 15–41)
Albumin: 4.1 g/dL (ref 3.5–5.0)
Alkaline Phosphatase: 83 U/L (ref 38–126)
Anion gap: 12 (ref 5–15)
BUN: 13 mg/dL (ref 6–20)
CO2: 24 mmol/L (ref 22–32)
Calcium: 9.9 mg/dL (ref 8.9–10.3)
Chloride: 104 mmol/L (ref 98–111)
Creatinine, Ser: 0.84 mg/dL (ref 0.44–1.00)
GFR, Estimated: >60 mL/min (ref >60)
Glucose, Bld: 132 mg/dL — ABNORMAL HIGH (ref 70–99)
Potassium: 3.7 mmol/L (ref 3.5–5.1)
Sodium: 140 mmol/L (ref 135–145)
Total Bilirubin: 0.8 mg/dL (ref 0.3–1.2)
Total Protein: 7.2 g/dL (ref 6.5–8.1)

## 2022-12-18 LAB — HEMOGLOBIN A1C
Hgb A1c MFr Bld: 5.4 % (ref 4.8–5.6)
Mean Plasma Glucose: 108.28 mg/dL

## 2022-12-18 LAB — RESP PANEL BY RT-PCR (RSV, FLU A&B, COVID)  RVPGX2
Influenza A by PCR: NEGATIVE
Influenza B by PCR: NEGATIVE
Resp Syncytial Virus by PCR: NEGATIVE
SARS Coronavirus 2 by RT PCR: NEGATIVE

## 2022-12-18 LAB — LIPID PANEL
Cholesterol: 229 mg/dL — ABNORMAL HIGH (ref 0–200)
HDL: 64 mg/dL (ref >40)
LDL Cholesterol: 137 mg/dL — ABNORMAL HIGH (ref 0–99)
Total CHOL/HDL Ratio: 3.6 RATIO
Triglycerides: 139 mg/dL (ref <150)
VLDL: 28 mg/dL (ref 0–40)

## 2022-12-18 LAB — TSH: TSH: 2.295 u[IU]/mL (ref 0.350–4.500)

## 2022-12-18 MED ORDER — HYDROXYZINE HCL 25 MG PO TABS
25.0000 mg | ORAL_TABLET | Freq: Three times a day (TID) | ORAL | Status: DC | PRN
  Filled 2022-12-18: qty 1

## 2022-12-18 MED ORDER — FLUOXETINE HCL 20 MG PO CAPS
40.0000 mg | ORAL_CAPSULE | Freq: Every day | ORAL | Status: DC
  Administered 2022-12-18 – 2022-12-21 (×4): 40 mg via ORAL
  Filled 2022-12-18 (×4): qty 2

## 2022-12-18 MED ORDER — QUETIAPINE FUMARATE 300 MG PO TABS
300.0000 mg | ORAL_TABLET | Freq: Every day | ORAL | Status: DC
  Administered 2022-12-18 – 2022-12-20 (×3): 300 mg via ORAL
  Filled 2022-12-18 (×3): qty 1

## 2022-12-18 MED ORDER — QUETIAPINE FUMARATE 300 MG PO TABS
300.0000 mg | ORAL_TABLET | ORAL | Status: AC
  Administered 2022-12-18: 300 mg via ORAL
  Filled 2022-12-18: qty 1
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
## 2022-12-18 NOTE — Tx Team (Signed)
LCSW met with patient to assess current mood, affect, physical state, and inquire about needs/goals while here in [facility] and after discharge. Patient reports she presented because "I tried to kill myself 3 days by slitting my wrist". Superficial cut on left wrist. Patient denies any current SI/HI/AVH. Patient does report a history of SI attempts with the last one being last year via overdose. Patient reports she has been slipping more and more into deep depression, and reports "I have no more fight in me". Patient reports current stressors is lack of finances, transportation, unemployment, and loss of job and car. Patient reports her mother is also sick with cancer and she will also be finding out on next week if she has been diagnosed with cancer. Patient reports she has attempted to apply for SSI and disability and was denied. Patient became tearful during the assessment and was provided brief supportive counseling. Patient reports she lives with her mother and 21 year old son. Patient reports stressors regarding that as her name is not on the lease and she is not suppose to be there. Patient reports a long history of inpatient treatment for mental and substance abuse concerns. Patient reports a recent use crack cocaine and crystal meth. Patient reported "I didn't even use of line of meth and no more than a 20 rock of crack cocaine". Patient reports she has been to [facility] x 4, [facility] [place], and multiple detox at [facility]. Patient also reports being IVC'd last year and being taken over to [place] for thoughts of SI. Patient reports a history of imprisonment and reports having a hard time with securing a job due to her felony charge of larceny. Patient reports she has been off her medications since October of 2023 and reports she was receiving medication management via the [facility]. Patient reports it has been about a year since she has seen a therapist, and reports "I know I need to  be connected back with services". Patient reports she does not know what her goal is at this time. Patient reports that she is not sure if she wants residential placement or outpatient treatment. Patient reports "I just want to figure out who I am and I want to be able to smile again". Brief supportive counseling was provided to the patient. Patient made aware that LCSW will follow up to provide resources for residential placement. Patient expressed understanding and no other needs were reported at this time.   LCSW will continue to follow and provide support to patient while on [facility] unit.   Lucius Conn, LCSW Clinical Social Worker [place] [facility] Ph: [phone]

## 2022-12-18 NOTE — Discharge Planning (Signed)
Patient has been referred to the following facilities for review:  [facility]  LCSW followed up with the following facilities and was informed that there are no beds available or patient does not meet criteria:  [facility]: due to insurance  [facility]: due to insurance  [facility]: No beds available [facility]: Currently closed  [facility]: must have attended inpatient treatment for a minimum of 2 weeks.  [facility]: no beds available per Mrs. Terry  Patient was also provided a list of long-term residential facilities to follow up with to complete phone screening. [facility], [facility], [facility], and [facility]. LCSW will continue to follow and provide support to patient while on [facility] unit.   Lucius Conn, LCSW Clinical Social Worker [place] [facility] Ph: [phone]

## 2022-12-18 NOTE — ED Notes (Signed)
Pt is in the dayroom watching TV.  Respirations are even and unlabored. No acute distress noted. Will continue to monitor for safety. 

## 2022-12-18 NOTE — ED Provider Notes (Signed)
Behavioral Health Progress Note  Date and Time: 12/18/2022 1:47 PM Name: Michele Webster MRN:  [id number]  Subjective:   The patient is a 47 year old female with a past psychiatric history of cocaine and methamphetamine use disorders, and generalized anxiety disorder and bipolar affective disorder, diagnosed by her outpatient psychiatric provider.  The patient has been admitted to the behavioral health hospital on 2 occasions.  The most recent stay in 2022 was for an overdose on medications.  Diagnosis given was bipolar 2.  The patient presented to the [facility] and reportedly wanted help with substance use.  She was admitted to the facility based crisis.  On initial evaluation 1/18 the patient reports she has no issues with drug use but has been depressed and attempted suicide via medication 3 days ago.  (Of note, the patient's UDS is positive for almost every substance). She reports that her mother is requiring her to get help.  LCSW working on rehab referrals and is given the patient information for shelters to call.  She does not want to go to [facility] residential given past experience there.  It is uncertain if the patient can return home.  The patient denies auditory/visual hallucinations and first rank symptoms.  The patient reports good mood, appetite, and sleep. They deny suicidal and homicidal thoughts. The patient denies side effects from their medications.  Review of systems as below. The patient denies experiencing any withdrawal symptoms.    Diagnosis:  Final diagnoses:  Cocaine abuse ([place])  Methamphetamine abuse ([place])  Marijuana abuse  Recurrent major depressive disorder, remission status unspecified ([place])    Total Time spent with patient: 20 minutes  Past Psychiatric History: as above Past Medical History:  Past Medical History:  Diagnosis Date   Bipolar 2 disorder ([place]) 04/19/2021   Cocaine use disorder, moderate, dependence ([place])    GAD  (generalized anxiety disorder)    History of suicide attempt    hx several  times,  [facility] 05/ 2022 in epic   MDD (major depressive disorder)    Methadone use disorder, moderate, dependence ([place])    Recovering alcoholic ([place])    Smokers' cough ([place])    11-18-2021 per pt morning productive cough   Ulnar shaft fracture 11/08/2021   left    Past Surgical History:  Procedure Laterality Date   ORIF ULNAR FRACTURE Left 11/21/2021   Procedure: OPEN REDUCTION INTERNAL FIXATION (ORIF) left ULNAR shaft  FRACTURE;  Surgeon: Orene Desanctis, MD;  Location: [facility];  Service: Orthopedics;  Laterality: Left;  with MAC, needs 60 minutes   TIBIA IM NAIL INSERTION Right 12/31/2017   Procedure: INTRAMEDULLARY (IM) NAIL TIBIAL;  Surgeon: Dorna Leitz, MD;  Location: [facility];  Service: Orthopedics;  Laterality: Right;   TUBAL LIGATION Bilateral 11/13/2006   _0 ;   PPTL   Family History:  Family History  Problem Relation Age of Onset   Cancer Maternal Grandmother        breast   Family Psychiatric  History: none Social History:  Social History   Substance and Sexual Activity  Alcohol Use Not Currently   Comment: recovering alchohlic.     Social History   Substance and Sexual Activity  Drug Use Yes   Frequency: 7.0 times per week   Types: Marijuana, Cocaine   Comment: crack almost every day 0.5 grams. Weed: 1 blunt per day    Social History   Socioeconomic History   Marital status: Divorced  Spouse name: Not on file   Number of children: Not on file   Years of education: Not on file   Highest education level: Not on file  Occupational History   Not on file  Tobacco Use   Smoking status: Every Day    Packs/day: 1.00    Years: 35.00    Total pack years: 35.00    Types: Cigarettes   Smokeless tobacco: Never  Vaping Use   Vaping Use: Never used  Substance and Sexual Activity   Alcohol use: Not Currently    Comment: recovering alchohlic.    Drug use: Yes    Frequency: 7.0 times per week    Types: Marijuana, Cocaine    Comment: crack almost every day 0.5 grams. Weed: 1 blunt per day   Sexual activity: Yes    Partners: Male    Birth control/protection: Surgical  Other Topics Concern   Not on file  Social History Narrative   ** Merged History Encounter **       Social Determinants of Health   Financial Resource Strain: Low Risk  (03/11/2021)   Overall Financial Resource Strain (CARDIA)    Difficulty of Paying Living Expenses: Not hard at all  Food Insecurity: No Food Insecurity (03/11/2021)   Hunger Vital Sign    Worried About Running Out of Food in the Last Year: Never true    Ran Out of Food in the Last Year: Never true  Transportation Needs: No Transportation Needs (03/11/2021)   PRAPARE - Hydrologist (Medical): No    Lack of Transportation (Non-Medical): No  Physical Activity: Sufficiently Active (03/11/2021)   Exercise Vital Sign    Days of Exercise per Week: 7 days    Minutes of Exercise per Session: 30 min  Stress: Stress Concern Present (02/20/2022)   [place]    Feeling of Stress : Very much  Social Connections: Socially Isolated (03/11/2021)   Social Connection and Isolation Panel [NHANES]    Frequency of Communication with Friends and Family: Once a week    Frequency of Social Gatherings with Friends and Family: Once a week    Attends Religious Services: Never    Marine scientist or Organizations: No    Attends Archivist Meetings: Never    Marital Status: Divorced   SDOH:  SDOH Teacher, English as a foreign language Insecurity: No Food Insecurity (03/11/2021)  Housing: Low Risk  (03/11/2021)  Transportation Needs: No Transportation Needs (03/11/2021)  Alcohol Screen: Low Risk  (04/18/2021)  Depression (PHQ2-9): High Risk (02/20/2022)  Financial Resource Strain: Low Risk  (03/11/2021)  Physical Activity:  Sufficiently Active (03/11/2021)  Social Connections: Socially Isolated (03/11/2021)  Stress: Stress Concern Present (02/20/2022)  Tobacco Use: High Risk (11/08/2022)   Additional Social History:    Pain Medications: See MAR Prescriptions: See MAR Over the Counter: See  MAR History of alcohol / drug use?: Yes Longest period of sobriety (when/how long): 6 years. Negative Consequences of Use: Financial Withdrawal Symptoms: Irritability Name of Substance 1: Crack Cocaine. 1 - Age of First Use: [age] 1 - Amount (size/oz): Pt reports, using $20 worth of Crack yesterday. 1 - Frequency: Pt reports, once per week. 1 - Duration: Ongoing. 1 - Last Use / Amount: Yesterday. 1 - Method of Aquiring: Purchase. 1- Route of Use: Smoke.                  Sleep: Fair  Appetite:  Fair  Current Medications:  Current Facility-Administered Medications  Medication Dose Route Frequency Provider Last Rate Last Admin   acetaminophen (TYLENOL) tablet 650 mg  650 mg Oral Q6H PRN Evette Georges, NP       alum & mag hydroxide-simeth (MAALOX/MYLANTA) 200-200-20 MG/5ML suspension 30 mL  30 mL Oral Q4H PRN Evette Georges, NP       magnesium hydroxide (MILK OF MAGNESIA) suspension 30 mL  30 mL Oral Daily PRN Evette Georges, NP       nicotine (NICODERM CQ - dosed in mg/24 hours) patch 21 mg  21 mg Transdermal Daily Evette Georges, NP   21 mg at 12/18/22 4128   Current Outpatient Medications  Medication Sig Dispense Refill   FLUoxetine (PROZAC) 40 MG capsule Take 1 capsule (40 mg total) by mouth daily. 90 capsule 0   gabapentin (NEURONTIN) 100 MG capsule Take 2 capsules (200 mg total) by mouth 3 (three) times daily. (Patient taking differently: Take 100 mg by mouth 4 (four) times daily.) 540 capsule 0   naltrexone (DEPADE) 50 MG tablet Take 50 mg by mouth daily.     QUEtiapine (SEROQUEL) 300 MG tablet TAKE 1 TABLET BY MOUTH EVERYDAY AT BEDTIME (Patient taking differently: Take 300 mg by mouth at bedtime.) 90  tablet 0    Labs  Lab Results:  Admission on 12/17/2022  Component Date Value Ref Range Status   SARS Coronavirus 2 by RT PCR 12/17/2022 NEGATIVE  NEGATIVE Final   Comment: (NOTE) SARS-CoV-2 target nucleic acids are NOT DETECTED.  The SARS-CoV-2 RNA is generally detectable in upper respiratory specimens during the acute phase of infection. The lowest concentration of SARS-CoV-2 viral copies this assay can detect is 138 copies/mL. A negative result does not preclude SARS-Cov-2 infection and should not be used as the sole basis for treatment or other patient management decisions. A negative result may occur with  improper specimen collection/handling, submission of specimen other than nasopharyngeal swab, presence of viral mutation(s) within the areas targeted by this assay, and inadequate number of viral copies(<138 copies/mL). A negative result must be combined with clinical observations, patient history, and epidemiological information. The expected result is Negative.  Fact Sheet for Patients:  EntrepreneurPulse.com.au  Fact Sheet for Healthcare Providers:  IncredibleEmployment.be  This test is no                          t yet approved or cleared by the [place] FDA and  has been authorized for detection and/or diagnosis of SARS-CoV-2 by FDA under an Emergency Use Authorization (EUA). This EUA will remain  in effect (meaning this test can be used) for the duration of the COVID-19 declaration under Section 564(b)(1) of the Act, 21 U.S.C.section 360bbb-3(b)(1), unless the authorization is terminated  or revoked sooner.       Influenza A by PCR 12/17/2022 NEGATIVE  NEGATIVE Final   Influenza B by PCR 12/17/2022 NEGATIVE  NEGATIVE Final   Comment: (NOTE) The Xpert Xpress SARS-CoV-2/FLU/RSV plus assay is intended as an aid in the diagnosis of influenza from Nasopharyngeal swab specimens and should not be used as a sole basis for  treatment. Nasal washings and aspirates are unacceptable for Xpert Xpress SARS-CoV-2/FLU/RSV testing.  Fact Sheet for Patients: EntrepreneurPulse.com.au  Fact Sheet for Healthcare Providers: IncredibleEmployment.be  This test is not yet approved or cleared by the [place] FDA and has been authorized for detection and/or diagnosis of SARS-CoV-2 by FDA under an Emergency Use  Authorization (EUA). This EUA will remain in effect (meaning this test can be used) for the duration of the COVID-19 declaration under Section 564(b)(1) of the Act, 21 U.S.C. section 360bbb-3(b)(1), unless the authorization is terminated or revoked.     Resp Syncytial Virus by PCR 12/17/2022 NEGATIVE  NEGATIVE Final   Comment: (NOTE) Fact Sheet for Patients: EntrepreneurPulse.com.au  Fact Sheet for Healthcare Providers: IncredibleEmployment.be  This test is not yet approved or cleared by the [place] FDA and has been authorized for detection and/or diagnosis of SARS-CoV-2 by FDA under an Emergency Use Authorization (EUA). This EUA will remain in effect (meaning this test can be used) for the duration of the COVID-19 declaration under Section 564(b)(1) of the Act, 21 U.S.C. section 360bbb-3(b)(1), unless the authorization is terminated or revoked.  Performed at [facility], [place] [address], [place], [place] [address]    WBC 12/17/2022 11.2 (H)  4.0 - 10.5 K/uL Final   RBC 12/17/2022 4.44  3.87 - 5.11 MIL/uL Final   Hemoglobin 12/17/2022 13.7  12.0 - 15.0 g/dL Final   HCT 12/17/2022 40.4  36.0 - 46.0 % Final   MCV 12/17/2022 91.0  80.0 - 100.0 fL Final   MCH 12/17/2022 30.9  26.0 - 34.0 pg Final   MCHC 12/17/2022 33.9  30.0 - 36.0 g/dL Final   RDW 12/17/2022 13.2  11.5 - 15.5 % Final   Platelets 12/17/2022 397  150 - 400 K/uL Final   nRBC 12/17/2022 0.0  0.0 - 0.2 % Final   Neutrophils Relative % 12/17/2022 73  %  Final   Neutro Abs 12/17/2022 8.3 (H)  1.7 - 7.7 K/uL Final   Lymphocytes Relative 12/17/2022 18  % Final   Lymphs Abs 12/17/2022 2.0  0.7 - 4.0 K/uL Final   Monocytes Relative 12/17/2022 7  % Final   Monocytes Absolute 12/17/2022 0.7  0.1 - 1.0 K/uL Final   Eosinophils Relative 12/17/2022 1  % Final   Eosinophils Absolute 12/17/2022 0.1  0.0 - 0.5 K/uL Final   Basophils Relative 12/17/2022 1  % Final   Basophils Absolute 12/17/2022 0.1  0.0 - 0.1 K/uL Final   Immature Granulocytes 12/17/2022 0  % Final   Abs Immature Granulocytes 12/17/2022 0.02  0.00 - 0.07 K/uL Final   Performed at [facility], [place] [address], [place], [place] [address]   Sodium 12/17/2022 140  135 - 145 mmol/L Final   Potassium 12/17/2022 3.7  3.5 - 5.1 mmol/L Final   Chloride 12/17/2022 104  98 - 111 mmol/L Final   CO2 12/17/2022 24  22 - 32 mmol/L Final   Glucose, Bld 12/17/2022 132 (H)  70 - 99 mg/dL Final   Glucose reference range applies only to samples taken after fasting for at least 8 hours.   BUN 12/17/2022 13  6 - 20 mg/dL Final   Creatinine, Ser 12/17/2022 0.84  0.44 - 1.00 mg/dL Final   Calcium 12/17/2022 9.9  8.9 - 10.3 mg/dL Final   Total Protein 12/17/2022 7.2  6.5 - 8.1 g/dL Final   Albumin 12/17/2022 4.1  3.5 - 5.0 g/dL Final   AST 12/17/2022 17  15 - 41 U/L Final   ALT 12/17/2022 18  0 - 44 U/L Final   Alkaline Phosphatase 12/17/2022 83  38 - 126 U/L Final   Total Bilirubin 12/17/2022 0.8  0.3 - 1.2 mg/dL Final   GFR, Estimated 12/17/2022 >60  >60 mL/min Final   Comment: (NOTE) Calculated using  the CKD-EPI Creatinine Equation (2021)    Anion gap 12/17/2022 12  5 - 15 Final   Performed at [facility], [place] [address] [place], [place] [address]   Hgb A1c MFr Bld 12/17/2022 5.4  4.8 - 5.6 % Final   Comment: (NOTE) Pre diabetes:          5.7%-6.4%  Diabetes:              >6.4%  Glycemic control for   <7.0% adults with diabetes    Mean Plasma Glucose 12/17/2022 108.28  mg/dL  Final   Performed at [facility], [place] [address], [place], [place] [address]   Cholesterol 12/17/2022 229 (H)  0 - 200 mg/dL Final   Triglycerides 12/17/2022 139  <150 mg/dL Final   HDL 12/17/2022 64  >40 mg/dL Final   Total CHOL/HDL Ratio 12/17/2022 3.6  RATIO Final   VLDL 12/17/2022 28  0 - 40 mg/dL Final   LDL Cholesterol 12/17/2022 137 (H)  0 - 99 mg/dL Final   Comment:        Total Cholesterol/HDL:CHD Risk Coronary Heart Disease Risk Table                     Men   Women  1/2 Average Risk   3.4   3.3  Average Risk       5.0   4.4  2 X Average Risk   9.6   7.1  3 X Average Risk  23.4   11.0        Use the calculated Patient Ratio above and the CHD Risk Table to determine the patient's CHD Risk.        ATP III CLASSIFICATION (LDL):  <100     mg/dL   Optimal  100-129  mg/dL   Near or Above                    Optimal  130-159  mg/dL   Borderline  160-189  mg/dL   High  >190     mg/dL   Very High Performed at [place] [address], [place], [place] [address]    TSH 12/17/2022 2.295  0.350 - 4.500 uIU/mL Final   Comment: Performed by a 3rd Generation assay with a functional sensitivity of <=0.01 uIU/mL. Performed at [facility], [place] [address] [place], [place] [address]    Preg Test, Ur 12/17/2022 Negative  Negative Preliminary   POC Amphetamine UR 12/17/2022 Positive (A)  NONE DETECTED (Cut Off Level 1000 ng/mL) Preliminary   POC Secobarbital (BAR) 12/17/2022 None Detected  NONE DETECTED (Cut Off Level 300 ng/mL) Preliminary   POC Buprenorphine (BUP) 12/17/2022 Positive (A)  NONE DETECTED (Cut Off Level 10 ng/mL) Preliminary   POC Oxazepam (BZO) 12/17/2022 Positive (A)  NONE DETECTED (Cut Off Level 300 ng/mL) Preliminary   POC Cocaine UR 12/17/2022 Positive (A)  NONE DETECTED (Cut Off Level 300 ng/mL) Preliminary   POC Methamphetamine UR 12/17/2022 Positive (A)  NONE DETECTED (Cut Off Level 1000 ng/mL) Preliminary   POC Morphine 12/17/2022 Positive  (A)  NONE DETECTED (Cut Off Level 300 ng/mL) Preliminary   POC Methadone UR 12/17/2022 None Detected  NONE DETECTED (Cut Off Level 300 ng/mL) Preliminary   POC Oxycodone UR 12/17/2022 None Detected  NONE DETECTED (Cut Off Level 100 ng/mL) Preliminary   POC Marijuana UR 12/17/2022 Positive (A)  NONE DETECTED (Cut Off Level 50 ng/mL) Preliminary  SARSCOV2ONAVIRUS 2 AG 12/17/2022 NEGATIVE  NEGATIVE Final   Comment: (NOTE) SARS-CoV-2 antigen NOT DETECTED.   Negative results are presumptive.  Negative results do not preclude SARS-CoV-2 infection and should not be used as the sole basis for treatment or other patient management decisions, including infection  control decisions, particularly in the presence of clinical signs and  symptoms consistent with COVID-19, or in those who have been in contact with the virus.  Negative results must be combined with clinical observations, patient history, and epidemiological information. The expected result is Negative.  Fact Sheet for Patients: HandmadeRecipes.com.cy  Fact Sheet for Healthcare Providers: FuneralLife.at  This test is not yet approved or cleared by the [place] FDA and  has been authorized for detection and/or diagnosis of SARS-CoV-2 by FDA under an Emergency Use Authorization (EUA).  This EUA will remain in effect (meaning this test can be used) for the duration of  the COV                          ID-19 declaration under Section 564(b)(1) of the Act, 21 U.S.C. section 360bbb-3(b)(1), unless the authorization is terminated or revoked sooner.     Preg Test, Ur 12/17/2022 NEGATIVE  NEGATIVE Final   Comment:        THE SENSITIVITY OF THIS METHODOLOGY IS >24 mIU/mL   Admission on 11/08/2022, Discharged on 11/08/2022  Component Date Value Ref Range Status   WBC 11/08/2022 15.0 (H)  4.0 - 10.5 K/uL Final   RBC 11/08/2022 4.25  3.87 - 5.11 MIL/uL Final   Hemoglobin 11/08/2022 13.2   12.0 - 15.0 g/dL Final   HCT 11/08/2022 40.2  36.0 - 46.0 % Final   MCV 11/08/2022 94.6  80.0 - 100.0 fL Final   MCH 11/08/2022 31.1  26.0 - 34.0 pg Final   MCHC 11/08/2022 32.8  30.0 - 36.0 g/dL Final   RDW 11/08/2022 13.0  11.5 - 15.5 % Final   Platelets 11/08/2022 438 (H)  150 - 400 K/uL Final   nRBC 11/08/2022 0.0  0.0 - 0.2 % Final   Neutrophils Relative % 11/08/2022 81  % Final   Neutro Abs 11/08/2022 12.4 (H)  1.7 - 7.7 K/uL Final   Lymphocytes Relative 11/08/2022 10  % Final   Lymphs Abs 11/08/2022 1.4  0.7 - 4.0 K/uL Final   Monocytes Relative 11/08/2022 6  % Final   Monocytes Absolute 11/08/2022 0.8  0.1 - 1.0 K/uL Final   Eosinophils Relative 11/08/2022 1  % Final   Eosinophils Absolute 11/08/2022 0.1  0.0 - 0.5 K/uL Final   Basophils Relative 11/08/2022 1  % Final   Basophils Absolute 11/08/2022 0.1  0.0 - 0.1 K/uL Final   Immature Granulocytes 11/08/2022 1  % Final   Abs Immature Granulocytes 11/08/2022 0.09 (H)  0.00 - 0.07 K/uL Final   Performed at [facility], [place] [address] [place], [place] [address]   Sodium 11/08/2022 140  135 - 145 mmol/L Final   Potassium 11/08/2022 4.3  3.5 - 5.1 mmol/L Final   Chloride 11/08/2022 106  98 - 111 mmol/L Final   CO2 11/08/2022 24  22 - 32 mmol/L Final   Glucose, Bld 11/08/2022 132 (H)  70 - 99 mg/dL Final   Glucose reference range applies only to samples taken after fasting for at least 8 hours.   BUN 11/08/2022 16  6 - 20 mg/dL Final   Creatinine, Ser 11/08/2022 0.95  0.44 - 1.00 mg/dL Final   Calcium 11/08/2022 9.4  8.9 - 10.3 mg/dL Final   GFR, Estimated 11/08/2022 >60  >60 mL/min Final   Comment: (NOTE) Calculated using the CKD-EPI Creatinine Equation (2021)    Anion gap 11/08/2022 10  5 - 15 Final   Performed at [facility], [place] [address], [place], [place] [address]   Magnesium 11/08/2022 2.2  1.7 - 2.4 mg/dL Final   Performed at [place] [address], [place], [place] [address]   Opiates 11/08/2022 NONE DETECTED  NONE DETECTED Final   Cocaine 11/08/2022 POSITIVE (A)  NONE DETECTED Final   Benzodiazepines 11/08/2022 NONE DETECTED  NONE DETECTED Final   Amphetamines 11/08/2022 NONE DETECTED  NONE DETECTED Final   Tetrahydrocannabinol 11/08/2022 NONE DETECTED  NONE DETECTED Final   Barbiturates 11/08/2022 NONE DETECTED  NONE DETECTED Final   Comment: (NOTE) DRUG SCREEN FOR MEDICAL PURPOSES ONLY.  IF CONFIRMATION IS NEEDED FOR ANY PURPOSE, NOTIFY LAB WITHIN 5 DAYS.  LOWEST DETECTABLE LIMITS FOR URINE DRUG SCREEN Drug Class                     Cutoff (ng/mL) Amphetamine and metabolites    1000 Barbiturate and metabolites    200 Benzodiazepine                 200 Opiates and metabolites        300 Cocaine and metabolites        300 THC                            50 Performed at [facility], [place] [address], [place], [place] [address]     Blood Alcohol level:  Lab Results  Component Value Date   [facility] <10 04/18/2021   ETH <5 43/32/9518    Metabolic Disorder Labs: Lab Results  Component Value Date   HGBA1C 5.4 12/17/2022   MPG 108.28 12/17/2022   MPG 116.89 04/19/2021   No results found for: "PROLACTIN" Lab Results  Component Value Date   CHOL 229 (H) 12/17/2022   TRIG 139 12/17/2022   HDL 64 12/17/2022   CHOLHDL 3.6 12/17/2022   VLDL 28 12/17/2022   LDLCALC 137 (H) 12/17/2022   LDLCALC 92 04/19/2021    Therapeutic Lab Levels: No results found for: "LITHIUM" No results found for: "VALPROATE" No results found for: "CBMZ"  Physical Findings   AIMS    Flowsheet Row Admission (Discharged) from 04/19/2021 in [facility] Admission (Discharged) from 10/31/2015 in [facility]  AIMS Total Score 0 0      AUDIT    Flowsheet Row Admission (Discharged) from 04/19/2021 in [facility] Admission  (Discharged) from 10/31/2015 in [facility]  Alcohol Use Disorder Identification Test Final Score (AUDIT) 0 0      PHQ2-9    Flowsheet Row Counselor from 02/20/2022 in [facility] Video Visit from 09/09/2021 in [facility] Counselor from 03/11/2021 in [facility]  PHQ-2 Total Score _0 PHQ-9 Total Score _1 [facility] ED from 12/17/2022 in [facility] ED from 11/08/2022 in [facility] DEPT ED from 10/30/2022 in [facility]PT  C-SSRS RISK CATEGORY High  Risk No Risk No Risk        Musculoskeletal  Strength & Muscle Tone: within normal limits Gait & Station: normal Patient leans: N/A  Psychiatric Specialty Exam: Physical Exam Constitutional:      Appearance: the patient is not toxic-appearing.  Pulmonary:     Effort: Pulmonary effort is normal.  Neurological:     General: No focal deficit present.     Mental Status: the patient is alert and oriented to person, place, and time.   Review of Systems  Respiratory:  Negative for shortness of breath.   Cardiovascular:  Negative for chest pain.  Gastrointestinal:  Negative for abdominal pain, constipation, diarrhea, nausea and vomiting.  Neurological:  Negative for headaches.      BP 110/74 (BP Location: Right Arm)   Pulse 93   Temp 98.5 F (36.9 C) (Oral)   Resp 18   SpO2 99%   General Appearance: Fairly Groomed  Eye Contact:  Good  Speech:  Clear and Coherent  Volume:  Normal  Mood:  Euthymic  Affect:  Congruent  Thought Process:  Coherent  Orientation:  Full (Time, Place, and Person)  Thought Content: Logical   Suicidal Thoughts:  No  Homicidal Thoughts:  No  Memory:  Immediate;   Good  Judgement:  fair  Insight: Fair  Psychomotor Activity:  Normal  Concentration:  Concentration: Good  Recall:   Good  Fund of Knowledge: Good  Language: Good  Akathisia:  No  Handed:    AIMS (if indicated): not done  Assets:  Communication Skills Desire for Improvement Leisure Time Physical Health  ADL's:  Intact  Cognition: WNL  Sleep:  Fair   Treatment Plan Summary: Daily contact with patient to assess and evaluate symptoms and progress in treatment and Medication management  Status: Voluntary, need to consider involuntary commitment if the patient desires to leave AMA, given her report of her recent suicide attempt and numerous psychosocial stressors such as losing her job and potentially not being able to go back to her mother's house.  Previous history of overdose on medications.  Numerous substance use disorders - No subjective or objective withdrawal, reported history of severe withdrawal - CIWA for 3 days  Bipolar affective disorder - Continue home Seroquel and Prozac, given patient's desire to get back on her previous medications - Do not prescribe home gabapentin given abuse potential with opioids  Medical: - Slight leukocytosis, likely stress reaction given no fever or other symptoms of infection.     Corky Sox, MD 12/18/2022 1:47 PM

## 2022-12-18 NOTE — ED Notes (Signed)
Pt is in the bed sleeping. Respirations are even and unlabored. No acute distress noted. Will continue to monitor for safety. 

## 2022-12-18 NOTE — ED Notes (Signed)
The patient was given a snack.  

## 2022-12-18 NOTE — ED Notes (Signed)
Patient awake and alert on unit sitting in a nutrition group now.  She is calm and cooperative with mild irritability and anxiety.  Making needs known appropriately.  Will monitor and provide safe supportive environment.

## 2022-12-18 NOTE — Progress Notes (Signed)
Patient rested for part of the day and then was awake in dayroom socializing with peers.  Patient is now attending a group with the chaplain.  She received some clothing from family however was not satisfied with what they brought.  She was encouraged to call them back and mke it clear what she needs.  Patient heard on the phone doing that.  No evidence of withdrawal.  Will monitor and provide safe environment.

## 2022-12-18 NOTE — ED Notes (Signed)
In a group meeting with the Chaplin 

## 2022-12-18 NOTE — ED Notes (Signed)
In a group meeting 

## 2022-12-19 ENCOUNTER — Encounter (HOSPITAL_COMMUNITY): Payer: Self-pay | Admitting: Psychiatry

## 2022-12-19 DIAGNOSIS — F3181 Bipolar II disorder: Secondary | ICD-10-CM | POA: Diagnosis not present

## 2022-12-19 DIAGNOSIS — F1410 Cocaine abuse, uncomplicated: Secondary | ICD-10-CM | POA: Diagnosis not present

## 2022-12-19 DIAGNOSIS — F1510 Other stimulant abuse, uncomplicated: Secondary | ICD-10-CM | POA: Diagnosis not present

## 2022-12-19 DIAGNOSIS — F1210 Cannabis abuse, uncomplicated: Secondary | ICD-10-CM | POA: Diagnosis not present

## 2022-12-19 LAB — RESP PANEL BY RT-PCR (RSV, FLU A&B, COVID)  RVPGX2
Influenza A by PCR: NEGATIVE
Influenza B by PCR: NEGATIVE
Resp Syncytial Virus by PCR: NEGATIVE
SARS Coronavirus 2 by RT PCR: NEGATIVE

## 2022-12-19 NOTE — ED Notes (Signed)
Patient A&Ox4. Denies intent to harm self/others when asked. Denies A/VH. Patient c/o HA rating 9/10 during am medication pass. Tylenol given. Pt states, I am coming down with this cold. I think I got it from the other patients here. My head is congested causing me to have a headache". Tylenol given. MD notified. Pt returned to her room to rest. Support and encouragement provided. Routine safety checks conducted according to facility protocol. Encouraged patient to notify staff if thoughts of harm toward self or others arise. Patient verbalize understanding and agreement. Will continue to monitor for safety.

## 2022-12-19 NOTE — ED Notes (Signed)
Received patient this PM. Patient in dayroom talking and watching TV with peers. Patient respirations are even and unlabored. Will continue to monitor for safety.

## 2022-12-19 NOTE — ED Notes (Signed)
Pt sleeping@this time. Breathing even and unlabored. Will continue to monitor for safety 

## 2022-12-19 NOTE — ED Provider Notes (Signed)
Behavioral Health Progress Note  Date and Time: 12/19/2022 1:36 PM Name: Michele Webster MRN:  [id number]  Subjective:   The patient is a 47 year old female with a past psychiatric history of cocaine and methamphetamine use disorders, and generalized anxiety disorder and bipolar affective disorder, diagnosed by her outpatient psychiatric provider.  The patient has been admitted to the behavioral health hospital on 2 occasions.  The most recent stay in 2022 was for an overdose on medications.  Diagnosis given was bipolar 2.  The patient presented to the [facility] and reportedly wanted help with substance use.  She was admitted to the facility based crisis.  On initial evaluation 1/18 the patient reports she has no issues with drug use but has been depressed and attempted suicide via medication 3 days ago.  (Of note, the patient's UDS is positive for almost every substance).  The patient reports that she is able to return home to her mother's house, if the patient "has my stuff lined up".  Can consider CD IOP but the patient may have transportation difficulties.  Will discuss bus transport with the patient.  Referral placed for [facility] residential which should be the patient's first option.  LCSW gave the patient long-term residential facilities for the patient to call.  Patient reports feeling sick today, with headache and shortness of breath.  Vital signs stable.  Denies sore throat or diarrhea.  Respiratory panel ordered.  The patient denies auditory/visual hallucinations and first rank symptoms.  The patient reports good mood, appetite, and sleep. They deny suicidal and homicidal thoughts. The patient denies side effects from their medications.  Review of systems as below. The patient denies experiencing any withdrawal symptoms.   Diagnosis:  Final diagnoses:  Cocaine abuse ([place])  Methamphetamine abuse ([place])  Marijuana abuse  Recurrent major depressive disorder,  remission status unspecified ([place])    Total Time spent with patient: 20 minutes  Past Psychiatric History: as above Past Medical History:  Past Medical History:  Diagnosis Date   Bipolar 2 disorder ([place]) 04/19/2021   Cocaine use disorder, moderate, dependence ([place])    GAD (generalized anxiety disorder)    History of suicide attempt    hx several  times,  last admitted to [facility] discharged 05/ 2022 in epic   MDD (major depressive disorder)    Methadone use disorder, moderate, dependence ([place])    Recovering alcoholic ([place])    Smokers' cough ([place])    11-18-2021 per pt morning productive cough   Ulnar shaft fracture 11/08/2021   left    Past Surgical History:  Procedure Laterality Date   ORIF ULNAR FRACTURE Left 11/21/2021   Procedure: OPEN REDUCTION INTERNAL FIXATION (ORIF) left ULNAR shaft  FRACTURE;  Surgeon: Orene Desanctis, MD;  Location: [place];  Service: Orthopedics;  Laterality: Left;  with MAC, needs 60 minutes   TIBIA IM NAIL INSERTION Right 12/31/2017   Procedure: INTRAMEDULLARY (IM) NAIL TIBIAL;  Surgeon: Dorna Leitz, MD;  Location: [facility];  Service: Orthopedics;  Laterality: Right;   TUBAL LIGATION Bilateral 11/13/2006   _0 ;   PPTL   Family History:  Family History  Problem Relation Age of Onset   Cancer Maternal Grandmother        breast   Family Psychiatric  History: none Social History:  Social History   Substance and Sexual Activity  Alcohol Use Not Currently   Comment: recovering alchohlic.     Social History   Substance and Sexual Activity  Drug  Use Yes   Frequency: 7.0 times per week   Types: Marijuana, Cocaine   Comment: crack almost every day 0.5 grams. Weed: 1 blunt per day    Social History   Socioeconomic History   Marital status: Divorced    Spouse name: Not on file   Number of children: Not on file   Years of education: Not on file   Highest education level: Not on file  Occupational History   Not on file  Tobacco  Use   Smoking status: Every Day    Packs/day: 1.00    Years: 35.00    Total pack years: 35.00    Types: Cigarettes   Smokeless tobacco: Never  Vaping Use   Vaping Use: Never used  Substance and Sexual Activity   Alcohol use: Not Currently    Comment: recovering alchohlic.   Drug use: Yes    Frequency: 7.0 times per week    Types: Marijuana, Cocaine    Comment: crack almost every day 0.5 grams. Weed: 1 blunt per day   Sexual activity: Yes    Partners: Male    Birth control/protection: Surgical  Other Topics Concern   Not on file  Social History Narrative   ** Merged History Encounter **       Social Determinants of Health   Financial Resource Strain: Low Risk  (03/11/2021)   Overall Financial Resource Strain (CARDIA)    Difficulty of Paying Living Expenses: Not hard at all  Food Insecurity: No Food Insecurity (03/11/2021)   Hunger Vital Sign    Worried About Running Out of Food in the Last Year: Never true    Ran Out of Food in the Last Year: Never true  Transportation Needs: No Transportation Needs (03/11/2021)   PRAPARE - Hydrologist (Medical): No    Lack of Transportation (Non-Medical): No  Physical Activity: Sufficiently Active (03/11/2021)   Exercise Vital Sign    Days of Exercise per Week: 7 days    Minutes of Exercise per Session: 30 min  Stress: Stress Concern Present (02/20/2022)   [place]    Feeling of Stress : Very much  Social Connections: Socially Isolated (03/11/2021)   Social Connection and Isolation Panel [NHANES]    Frequency of Communication with Friends and Family: Once a week    Frequency of Social Gatherings with Friends and Family: Once a week    Attends Religious Services: Never    Marine scientist or Organizations: No    Attends Archivist Meetings: Never    Marital Status: Divorced   SDOH:  SDOH Teacher, English as a foreign language Insecurity: No  Food Insecurity (03/11/2021)  Housing: Low Risk  (03/11/2021)  Transportation Needs: No Transportation Needs (03/11/2021)  Alcohol Screen: Low Risk  (04/18/2021)  Depression (PHQ2-9): High Risk (12/18/2022)  Financial Resource Strain: Low Risk  (03/11/2021)  Physical Activity: Sufficiently Active (03/11/2021)  Social Connections: Socially Isolated (03/11/2021)  Stress: Stress Concern Present (02/20/2022)  Tobacco Use: High Risk (11/08/2022)   Additional Social History:    Pain Medications: See MAR Prescriptions: See MAR Over the Counter: See  MAR History of alcohol / drug use?: Yes Longest period of sobriety (when/how long): 6 years. Negative Consequences of Use: Financial Withdrawal Symptoms: Irritability Name of Substance 1: Crack Cocaine. 1 - Age of First Use: [age] 1 - Amount (size/oz): Pt reports, using $20 worth of Crack yesterday. 1 - Frequency: Pt reports, once per  week. 1 - Duration: Ongoing. 1 - Last Use / Amount: Yesterday. 1 - Method of Aquiring: Purchase. 1- Route of Use: Smoke.                  Sleep: Fair  Appetite:  Fair  Current Medications:  Current Facility-Administered Medications  Medication Dose Route Frequency Provider Last Rate Last Admin   acetaminophen (TYLENOL) tablet 650 mg  650 mg Oral Q6H PRN Evette Georges, NP   650 mg at 12/19/22 0927   alum & mag hydroxide-simeth (MAALOX/MYLANTA) 200-200-20 MG/5ML suspension 30 mL  30 mL Oral Q4H PRN Evette Georges, NP       FLUoxetine (PROZAC) capsule 40 mg  40 mg Oral Daily Corky Sox, MD   40 mg at 12/19/22 6203   hydrOXYzine (ATARAX) tablet 25 mg  25 mg Oral TID PRN Onuoha, Chinwendu V, NP       magnesium hydroxide (MILK OF MAGNESIA) suspension 30 mL  30 mL Oral Daily PRN Evette Georges, NP       nicotine (NICODERM CQ - dosed in mg/24 hours) patch 21 mg  21 mg Transdermal Daily Evette Georges, NP   21 mg at 12/19/22 5597   QUEtiapine (SEROQUEL) tablet 300 mg  300 mg Oral QHS Corky Sox, MD   300 mg at  12/18/22 2108   Current Outpatient Medications  Medication Sig Dispense Refill   FLUoxetine (PROZAC) 40 MG capsule Take 1 capsule (40 mg total) by mouth daily. 90 capsule 0   gabapentin (NEURONTIN) 100 MG capsule Take 2 capsules (200 mg total) by mouth 3 (three) times daily. (Patient taking differently: Take 100 mg by mouth 4 (four) times daily.) 540 capsule 0   naltrexone (DEPADE) 50 MG tablet Take 50 mg by mouth daily.     QUEtiapine (SEROQUEL) 300 MG tablet TAKE 1 TABLET BY MOUTH EVERYDAY AT BEDTIME (Patient taking differently: Take 300 mg by mouth at bedtime.) 90 tablet 0    Labs  Lab Results:  Admission on 12/17/2022  Component Date Value Ref Range Status   SARS Coronavirus 2 by RT PCR 12/17/2022 NEGATIVE  NEGATIVE Final   Comment: (NOTE) SARS-CoV-2 target nucleic acids are NOT DETECTED.  The SARS-CoV-2 RNA is generally detectable in upper respiratory specimens during the acute phase of infection. The lowest concentration of SARS-CoV-2 viral copies this assay can detect is 138 copies/mL. A negative result does not preclude SARS-Cov-2 infection and should not be used as the sole basis for treatment or other patient management decisions. A negative result may occur with  improper specimen collection/handling, submission of specimen other than nasopharyngeal swab, presence of viral mutation(s) within the areas targeted by this assay, and inadequate number of viral copies(<138 copies/mL). A negative result must be combined with clinical observations, patient history, and epidemiological information. The expected result is Negative.  Fact Sheet for Patients:  EntrepreneurPulse.com.au  Fact Sheet for Healthcare Providers:  IncredibleEmployment.be  This test is no                          t yet approved or cleared by the [place] FDA and  has been authorized for detection and/or diagnosis of SARS-CoV-2 by FDA under an Emergency Use  Authorization (EUA). This EUA will remain  in effect (meaning this test can be used) for the duration of the COVID-19 declaration under Section 564(b)(1) of the Act, 21 U.S.C.section 360bbb-3(b)(1), unless the authorization is terminated  or revoked sooner.  Influenza A by PCR 12/17/2022 NEGATIVE  NEGATIVE Final   Influenza B by PCR 12/17/2022 NEGATIVE  NEGATIVE Final   Comment: (NOTE) The Xpert Xpress SARS-CoV-2/FLU/RSV plus assay is intended as an aid in the diagnosis of influenza from Nasopharyngeal swab specimens and should not be used as a sole basis for treatment. Nasal washings and aspirates are unacceptable for Xpert Xpress SARS-CoV-2/FLU/RSV testing.  Fact Sheet for Patients: EntrepreneurPulse.com.au  Fact Sheet for Healthcare Providers: IncredibleEmployment.be  This test is not yet approved or cleared by the [place] FDA and has been authorized for detection and/or diagnosis of SARS-CoV-2 by FDA under an Emergency Use Authorization (EUA). This EUA will remain in effect (meaning this test can be used) for the duration of the COVID-19 declaration under Section 564(b)(1) of the Act, 21 U.S.C. section 360bbb-3(b)(1), unless the authorization is terminated or revoked.     Resp Syncytial Virus by PCR 12/17/2022 NEGATIVE  NEGATIVE Final   Comment: (NOTE) Fact Sheet for Patients: EntrepreneurPulse.com.au  Fact Sheet for Healthcare Providers: IncredibleEmployment.be  This test is not yet approved or cleared by the [place] FDA and has been authorized for detection and/or diagnosis of SARS-CoV-2 by FDA under an Emergency Use Authorization (EUA). This EUA will remain in effect (meaning this test can be used) for the duration of the COVID-19 declaration under Section 564(b)(1) of the Act, 21 U.S.C. section 360bbb-3(b)(1), unless the authorization is terminated or revoked.  Performed at  [facility], [place] [address] [place], [place] [address]    WBC 12/17/2022 11.2 (H)  4.0 - 10.5 K/uL Final   RBC 12/17/2022 4.44  3.87 - 5.11 MIL/uL Final   Hemoglobin 12/17/2022 13.7  12.0 - 15.0 g/dL Final   HCT 12/17/2022 40.4  36.0 - 46.0 % Final   MCV 12/17/2022 91.0  80.0 - 100.0 fL Final   MCH 12/17/2022 30.9  26.0 - 34.0 pg Final   MCHC 12/17/2022 33.9  30.0 - 36.0 g/dL Final   RDW 12/17/2022 13.2  11.5 - 15.5 % Final   Platelets 12/17/2022 397  150 - 400 K/uL Final   nRBC 12/17/2022 0.0  0.0 - 0.2 % Final   Neutrophils Relative % 12/17/2022 73  % Final   Neutro Abs 12/17/2022 8.3 (H)  1.7 - 7.7 K/uL Final   Lymphocytes Relative 12/17/2022 18  % Final   Lymphs Abs 12/17/2022 2.0  0.7 - 4.0 K/uL Final   Monocytes Relative 12/17/2022 7  % Final   Monocytes Absolute 12/17/2022 0.7  0.1 - 1.0 K/uL Final   Eosinophils Relative 12/17/2022 1  % Final   Eosinophils Absolute 12/17/2022 0.1  0.0 - 0.5 K/uL Final   Basophils Relative 12/17/2022 1  % Final   Basophils Absolute 12/17/2022 0.1  0.0 - 0.1 K/uL Final   Immature Granulocytes 12/17/2022 0  % Final   Abs Immature Granulocytes 12/17/2022 0.02  0.00 - 0.07 K/uL Final   Performed at [facility], [place] [address], [place], [place] [address]   Sodium 12/17/2022 140  135 - 145 mmol/L Final   Potassium 12/17/2022 3.7  3.5 - 5.1 mmol/L Final   Chloride 12/17/2022 104  98 - 111 mmol/L Final   CO2 12/17/2022 24  22 - 32 mmol/L Final   Glucose, Bld 12/17/2022 132 (H)  70 - 99 mg/dL Final   Glucose reference range applies only to samples taken after fasting for at least 8 hours.   BUN 12/17/2022 13  6 - 20 mg/dL  Final   Creatinine, Ser 12/17/2022 0.84  0.44 - 1.00 mg/dL Final   Calcium 12/17/2022 9.9  8.9 - 10.3 mg/dL Final   Total Protein 12/17/2022 7.2  6.5 - 8.1 g/dL Final   Albumin 12/17/2022 4.1  3.5 - 5.0 g/dL Final   AST 12/17/2022 17  15 - 41 U/L Final   ALT 12/17/2022 18  0 - 44 U/L Final   Alkaline Phosphatase  12/17/2022 83  38 - 126 U/L Final   Total Bilirubin 12/17/2022 0.8  0.3 - 1.2 mg/dL Final   GFR, Estimated 12/17/2022 >60  >60 mL/min Final   Comment: (NOTE) Calculated using the CKD-EPI Creatinine Equation (2021)    Anion gap 12/17/2022 12  5 - 15 Final   Performed at [facility] [address], [place], [place] [address]   Hgb A1c MFr Bld 12/17/2022 5.4  4.8 - 5.6 % Final   Comment: (NOTE) Pre diabetes:          5.7%-6.4%  Diabetes:              >6.4%  Glycemic control for   <7.0% adults with diabetes    Mean Plasma Glucose 12/17/2022 108.28  mg/dL Final   Performed at [facility], [place] [address], [place], [place] [address]   Cholesterol 12/17/2022 229 (H)  0 - 200 mg/dL Final   Triglycerides 12/17/2022 139  <150 mg/dL Final   HDL 12/17/2022 64  >40 mg/dL Final   Total CHOL/HDL Ratio 12/17/2022 3.6  RATIO Final   VLDL 12/17/2022 28  0 - 40 mg/dL Final   LDL Cholesterol 12/17/2022 137 (H)  0 - 99 mg/dL Final   Comment:        Total Cholesterol/HDL:CHD Risk Coronary Heart Disease Risk Table                     Men   Women  1/2 Average Risk   3.4   3.3  Average Risk       5.0   4.4  2 X Average Risk   9.6   7.1  3 X Average Risk  23.4   11.0        Use the calculated Patient Ratio above and the CHD Risk Table to determine the patient's CHD Risk.        ATP III CLASSIFICATION (LDL):  <100     mg/dL   Optimal  100-129  mg/dL   Near or Above                    Optimal  130-159  mg/dL   Borderline  160-189  mg/dL   High  >190     mg/dL   Very High Performed at [facility] [address], [place], [place] [address]    TSH 12/17/2022 2.295  0.350 - 4.500 uIU/mL Final   Comment: Performed by a 3rd Generation assay with a functional sensitivity of <=0.01 uIU/mL. Performed at [facility], [place] [address] [place], [place] [address]    Preg Test, Ur 12/17/2022 Negative  Negative Preliminary   POC Amphetamine UR 12/17/2022 Positive (A)  NONE  DETECTED (Cut Off Level 1000 ng/mL) Preliminary   POC Secobarbital (BAR) 12/17/2022 None Detected  NONE DETECTED (Cut Off Level 300 ng/mL) Preliminary   POC Buprenorphine (BUP) 12/17/2022 Positive (A)  NONE DETECTED (Cut Off Level 10 ng/mL) Preliminary   POC Oxazepam (BZO) 12/17/2022 Positive (A)  NONE  DETECTED (Cut Off Level 300 ng/mL) Preliminary   POC Cocaine UR 12/17/2022 Positive (A)  NONE DETECTED (Cut Off Level 300 ng/mL) Preliminary   POC Methamphetamine UR 12/17/2022 Positive (A)  NONE DETECTED (Cut Off Level 1000 ng/mL) Preliminary   POC Morphine 12/17/2022 Positive (A)  NONE DETECTED (Cut Off Level 300 ng/mL) Preliminary   POC Methadone UR 12/17/2022 None Detected  NONE DETECTED (Cut Off Level 300 ng/mL) Preliminary   POC Oxycodone UR 12/17/2022 None Detected  NONE DETECTED (Cut Off Level 100 ng/mL) Preliminary   POC Marijuana UR 12/17/2022 Positive (A)  NONE DETECTED (Cut Off Level 50 ng/mL) Preliminary   SARSCOV2ONAVIRUS 2 AG 12/17/2022 NEGATIVE  NEGATIVE Final   Comment: (NOTE) SARS-CoV-2 antigen NOT DETECTED.   Negative results are presumptive.  Negative results do not preclude SARS-CoV-2 infection and should not be used as the sole basis for treatment or other patient management decisions, including infection  control decisions, particularly in the presence of clinical signs and  symptoms consistent with COVID-19, or in those who have been in contact with the virus.  Negative results must be combined with clinical observations, patient history, and epidemiological information. The expected result is Negative.  Fact Sheet for Patients: HandmadeRecipes.com.cy  Fact Sheet for Healthcare Providers: FuneralLife.at  This test is not yet approved or cleared by the [place] FDA and  has been authorized for detection and/or diagnosis of SARS-CoV-2 by FDA under an Emergency Use Authorization (EUA).  This EUA will remain in effect  (meaning this test can be used) for the duration of  the COV                          ID-19 declaration under Section 564(b)(1) of the Act, 21 U.S.C. section 360bbb-3(b)(1), unless the authorization is terminated or revoked sooner.     Preg Test, Ur 12/17/2022 NEGATIVE  NEGATIVE Final   Comment:        THE SENSITIVITY OF THIS METHODOLOGY IS >24 mIU/mL   Admission on 11/08/2022, Discharged on 11/08/2022  Component Date Value Ref Range Status   WBC 11/08/2022 15.0 (H)  4.0 - 10.5 K/uL Final   RBC 11/08/2022 4.25  3.87 - 5.11 MIL/uL Final   Hemoglobin 11/08/2022 13.2  12.0 - 15.0 g/dL Final   HCT 11/08/2022 40.2  36.0 - 46.0 % Final   MCV 11/08/2022 94.6  80.0 - 100.0 fL Final   MCH 11/08/2022 31.1  26.0 - 34.0 pg Final   MCHC 11/08/2022 32.8  30.0 - 36.0 g/dL Final   RDW 11/08/2022 13.0  11.5 - 15.5 % Final   Platelets 11/08/2022 438 (H)  150 - 400 K/uL Final   nRBC 11/08/2022 0.0  0.0 - 0.2 % Final   Neutrophils Relative % 11/08/2022 81  % Final   Neutro Abs 11/08/2022 12.4 (H)  1.7 - 7.7 K/uL Final   Lymphocytes Relative 11/08/2022 10  % Final   Lymphs Abs 11/08/2022 1.4  0.7 - 4.0 K/uL Final   Monocytes Relative 11/08/2022 6  % Final   Monocytes Absolute 11/08/2022 0.8  0.1 - 1.0 K/uL Final   Eosinophils Relative 11/08/2022 1  % Final   Eosinophils Absolute 11/08/2022 0.1  0.0 - 0.5 K/uL Final   Basophils Relative 11/08/2022 1  % Final   Basophils Absolute 11/08/2022 0.1  0.0 - 0.1 K/uL Final   Immature Granulocytes 11/08/2022 1  % Final   Abs Immature Granulocytes 11/08/2022 0.09 (H)  0.00 -  0.07 K/uL Final   Performed at [facility], [place] [address], [place], [place] [address]   Sodium 11/08/2022 140  135 - 145 mmol/L Final   Potassium 11/08/2022 4.3  3.5 - 5.1 mmol/L Final   Chloride 11/08/2022 106  98 - 111 mmol/L Final   CO2 11/08/2022 24  22 - 32 mmol/L Final   Glucose, Bld 11/08/2022 132 (H)  70 - 99 mg/dL Final   Glucose reference range applies  only to samples taken after fasting for at least 8 hours.   BUN 11/08/2022 16  6 - 20 mg/dL Final   Creatinine, Ser 11/08/2022 0.95  0.44 - 1.00 mg/dL Final   Calcium 11/08/2022 9.4  8.9 - 10.3 mg/dL Final   GFR, Estimated 11/08/2022 >60  >60 mL/min Final   Comment: (NOTE) Calculated using the CKD-EPI Creatinine Equation (2021)    Anion gap 11/08/2022 10  5 - 15 Final   Performed at [facility], [place] [address] [place], [place] [address]   Magnesium 11/08/2022 2.2  1.7 - 2.4 mg/dL Final   Performed at [facility] [address], [place], [place] [address]   Opiates 11/08/2022 NONE DETECTED  NONE DETECTED Final   Cocaine 11/08/2022 POSITIVE (A)  NONE DETECTED Final   Benzodiazepines 11/08/2022 NONE DETECTED  NONE DETECTED Final   Amphetamines 11/08/2022 NONE DETECTED  NONE DETECTED Final   Tetrahydrocannabinol 11/08/2022 NONE DETECTED  NONE DETECTED Final   Barbiturates 11/08/2022 NONE DETECTED  NONE DETECTED Final   Comment: (NOTE) DRUG SCREEN FOR MEDICAL PURPOSES ONLY.  IF CONFIRMATION IS NEEDED FOR ANY PURPOSE, NOTIFY LAB WITHIN 5 DAYS.  LOWEST DETECTABLE LIMITS FOR URINE DRUG SCREEN Drug Class                     Cutoff (ng/mL) Amphetamine and metabolites    1000 Barbiturate and metabolites    200 Benzodiazepine                 200 Opiates and metabolites        300 Cocaine and metabolites        300 THC                            50 Performed at [facility], [place] [address], [place], [place] [address]     Blood Alcohol level:  Lab Results  Component Value Date   [facility] <10 04/18/2021   ETH <5 70/62/3762    Metabolic Disorder Labs: Lab Results  Component Value Date   HGBA1C 5.4 12/17/2022   MPG 108.28 12/17/2022   MPG 116.89 04/19/2021   No results found for: "PROLACTIN" Lab Results  Component Value Date   CHOL 229 (H) 12/17/2022   TRIG 139 12/17/2022   HDL 64 12/17/2022   CHOLHDL 3.6 12/17/2022    VLDL 28 12/17/2022   LDLCALC 137 (H) 12/17/2022   LDLCALC 92 04/19/2021    Therapeutic Lab Levels: No results found for: "LITHIUM" No results found for: "VALPROATE" No results found for: "CBMZ"  Physical Findings   AIMS    Flowsheet Row Admission (Discharged) from 04/19/2021 in [facility] Admission (Discharged) from 10/31/2015 in [facility]  AIMS Total Score 0 0      AUDIT    Flowsheet Row Admission (Discharged) from 04/19/2021 in [facility] Admission (Discharged) from 10/31/2015  in [facility]  Alcohol Use Disorder Identification Test Final Score (AUDIT) 0 0      PHQ2-9    Flowsheet Row ED from 12/17/2022 in [facility] Counselor from 02/20/2022 in [facility] Video Visit from 09/09/2021 in [facility] Counselor from 03/11/2021 in [facility]  PHQ-2 Total Score _0 PHQ-9 Total Score _1 Colon ED from 12/17/2022 in [facility] ED from 11/08/2022 in [facility] Department at [facility] ED from 10/30/2022 in [facility] Emergency Department at [place] No Risk No Risk No Risk        Musculoskeletal  Strength & Muscle Tone: within normal limits Gait & Station: normal Patient leans: N/A  Psychiatric Specialty Exam: Physical Exam Constitutional:      Appearance: the patient is not toxic-appearing.  Pulmonary:     Effort: Pulmonary effort is normal.  Neurological:     General: No focal deficit present.     Mental Status: the patient is alert and oriented to person, place, and time.   Review of Systems  Respiratory:  Negative for shortness of breath.   Cardiovascular:  Negative for chest pain.  Gastrointestinal:   Negative for abdominal pain, constipation, diarrhea, nausea and vomiting.  Neurological:  Negative for headaches.      BP 104/62 (BP Location: Left Arm)   Pulse 70   Temp 97.7 F (36.5 C) (Oral)   Resp 18   SpO2 96%   General Appearance: Fairly Groomed  Eye Contact:  Good  Speech:  Clear and Coherent  Volume:  Normal  Mood:  Euthymic  Affect:  Congruent  Thought Process:  Coherent  Orientation:  Full (Time, Place, and Person)  Thought Content: Logical   Suicidal Thoughts:  No  Homicidal Thoughts:  No  Memory:  Immediate;   Good  Judgement:  fair  Insight: Fair  Psychomotor Activity:  Normal  Concentration:  Concentration: Good  Recall:  Good  Fund of Knowledge: Good  Language: Good  Akathisia:  No  Handed:    AIMS (if indicated): not done  Assets:  Communication Skills Desire for Improvement Leisure Time Physical Health  ADL's:  Intact  Cognition: WNL  Sleep:  Fair   Treatment Plan Summary: Daily contact with patient to assess and evaluate symptoms and progress in treatment and Medication management  Status: Voluntary, need to consider involuntary commitment if the patient desires to leave AMA, given her report of her recent suicide attempt and numerous psychosocial stressors such as losing her job and potentially not being able to go back to her mother's house.  Previous history of overdose on medications.  Numerous substance use disorders - No subjective or objective withdrawal, reported history of severe withdrawal - CIWA for 3 days  Bipolar affective disorder - Continue home Seroquel and Prozac, given patient's desire to get back on her previous medications - Do not prescribe home gabapentin given abuse potential with opioids  Medical: - Slight leukocytosis, likely stress reaction given no fever or other symptoms of infection. -Await respiratory panel    Corky Sox, MD 12/19/2022 1:36 PM

## 2022-12-19 NOTE — ED Notes (Signed)
Pt sitting in dayroom interacting with peers. Writer performed PCR test per MD. Pt tolerated well. . Informed pt to notify staff with any needs or assistance. Pt verbalized understanding or agreement. Will continue to monitor for safety.

## 2022-12-19 NOTE — Discharge Planning (Signed)
LCSW spoke with patient this morning regarding disposition plans. Per patient, she reports she has spoken with her mother who has encouraged her to go to [facility] if she is accepted. Patient made aware that referral has been sent and LCSW is just awaiting update. Patient expressed understanding. Patient informed that she was denied by [facility] due to no beds being available at this time. Patient expressed understanding.   Per MD, patient informed him that she could return home with her mother if services are set up for the patient. [facility] would be secondary option if patient is denied placement at [facility]. Patient was also provided the longterm residential facilities for her follow up. No other needs to report at this time.   LCSW will continue to follow and provide updates as received.   Lucius Conn, LCSW Clinical Social Worker [facility] Ph: [phone]

## 2022-12-19 NOTE — Discharge Instructions (Addendum)
Walk-In Clinic Hours   Walk In Hours are available for Medication Management and Therapy on the following days and times:   Monday  Medication Management - 7:30 am - 11 am  Therapy - 7:30 am - 4 pm  Tuesday   Medication Management - 7:30 am - 11 am  Therapy - 7:30 am - 4 pm  Wednesday   Medication Management - 7:30 am - 11 am  Therapy - 7:30 am - 4 pm  Thursday  Medication Management - 7:30 am - 11 am  Therapy - 7:30 am - 4 pm  Friday   Medication Management - 7:30 am - 11 am  Therapy - 12 pm - 4 pm  * Please note that limited slots are available, on any given day, for both services and appointments are distributed on a first come, first serve bases.   ** Walk-Ins/Scheduled Appointments must be [employer] and Uninsured or have Medicaid Coverage.      [facility] [address], , [place], [address] [phone] phone [phone] fax NOTE: Does have Substance Abuse-Intensive Outpatient Program [facility]) as well as transitional housing if eligible.  [place], [place] [address] [phone] Female and Female facility; [phone]; Residential Program; no additional fees; does not matter about insurance or uninsured; Will need to complete a phone screening; be able to work 40 hours a week; no prior sex offenders; no car for the first year of the program; 4 church services a week; receiving any income: may have to put 30% towards stay;  [facility]: Substance Use Transitional Living [phone]  [place], [place] [address] [phone]  [facility] in [place], [place]: [place] Desk Staff: Michel Bickers [phone]; They have a Men's Regenerations Program; The programs are 6-33months. There is an initial $300 fee however, they are willing to work with patients regarding that.   [facility] [address]  [place], [place]  [address] [phone] Population served: Female veterans 18+ with substance abuse issues Eligibility: By referral only  [facility] [address], [place], [place] [address] [phone] Population served: Adult men & women (60 years old and older, able to perform activities for daily living) Documents required: Valid ID & [place] [address] [place], [place]  [address] [phone] Population served: Families with children  [facility] [address] [place], [place]  [address] [phone] Population served: Single women 18+ without dependents Documents required:  [place] Card  [facility] [address], [place], [place]  [address] [phone] Population served: Female veterans 18+ with substance abuse/mental health issues Eligibility: By referral only  [facility] [address] [place], [place] [address] [phone] Population served: Males 18+ Documents required: Valid ID & Social Security Card  [facility] at [employer] [facility] [place]. [address], [place] [address] [phone] or [phone] Population served: Pregnant women with or without children Documents required: Valid ID & Social Security [employer] [place], [place], [place] [address] [phone] Population Served: Families with children, adult women, and adult men.  [facility] [address] [place], [place]  [address] [phone] Population served: Men 18+, preference for disabled and/or veterans Eligibility: By referral only  [place] [facility]) - [facility] [place], [place], [place] [address] [phone] or [phone] Population served: Families with children.

## 2022-12-19 NOTE — ED Notes (Signed)
Pt sleeping in no acute distress. RR even and unlabored. Environment secured. Will continue to monitor for safety. 

## 2022-12-19 NOTE — ED Notes (Signed)
Pt received bedtime snack. 

## 2022-12-20 DIAGNOSIS — F1210 Cannabis abuse, uncomplicated: Secondary | ICD-10-CM | POA: Diagnosis not present

## 2022-12-20 DIAGNOSIS — F3181 Bipolar II disorder: Secondary | ICD-10-CM | POA: Diagnosis not present

## 2022-12-20 DIAGNOSIS — F1410 Cocaine abuse, uncomplicated: Secondary | ICD-10-CM | POA: Diagnosis not present

## 2022-12-20 DIAGNOSIS — F1510 Other stimulant abuse, uncomplicated: Secondary | ICD-10-CM | POA: Diagnosis not present

## 2022-12-20 NOTE — ED Notes (Signed)
Patient did not attend group.

## 2022-12-20 NOTE — ED Notes (Signed)
Pt resting in bed. A&O x4, calm and cooperative. Denies current SI/HI/AVH. No signs of distress noted. Monitoring for safety. 

## 2022-12-20 NOTE — ED Provider Notes (Signed)
Behavioral Health Progress Note  Date and Time: 12/20/2022 12:04 PM Name: Michele Webster MRN:  [id number]  Subjective:   The patient is a 47 year old female with a past psychiatric history of cocaine and methamphetamine use disorders, and generalized anxiety disorder and bipolar affective disorder, diagnosed by her outpatient psychiatric provider. The patient has been admitted to the behavioral health hospital on 2 occasions. The most recent stay in 2022 was for an overdose on medications. Diagnosis given was bipolar 2. The patient presented to the [facility] and reportedly wanted help with substance use. She was admitted to the facility based crisis. On initial evaluation 1/18 the patient reports she has no issues with drug use but has been depressed and attempted suicide via medication 3 days ago. (Of note, the patient's UDS is positive for almost every substance).   On assessment today patient reports that she is physically and mentally feeling better. Patient reports that she feels her mood is much more stable. Patient reports that she slept well last night and that her appetite is "good." Patient reports that she believes her medications are beneficial and she is less irritable. Patient denies SI, HI, and AVH.   Patient continues to endorse that she is not sure how some of the substances got in her system. Patient reports that she continues to believe "something is going on" and is adamant that she is only aware of cocaine and meth use recently. Patient reports that she recalls "blacking out" and cutting her wrist and reports that she was very angry and irritable as well as confused when she arrived. Patient reports she is mostly concerned about her depression and thinks this is a big contribution to her presentation. Patient reports that she has had other health event over the last few months, and endorses belief that they are related to her "blackout." Patient  reports that she is not interested in rehab but is interested in [facility] and getting back into outpatient care.    Diagnosis:  Final diagnoses:  Cocaine abuse ([place])  Methamphetamine abuse ([place])  Marijuana abuse  Recurrent major depressive disorder, remission status unspecified ([place])    Total Time spent with patient: 15 minutes  Past Psychiatric History: See above Past Medical History:  Past Medical History:  Diagnosis Date   Bipolar 2 disorder ([place]) 04/19/2021   Cocaine use disorder, moderate, dependence ([place])    GAD (generalized anxiety disorder)    History of suicide attempt    hx several  times,  last admitted to [facility] discharged 05/ 2022 in epic   MDD (major depressive disorder)    Methadone use disorder, moderate, dependence ([place])    Recovering alcoholic ([place])    Smokers' cough ([place])    11-18-2021 per pt morning productive cough   Ulnar shaft fracture 11/08/2021   left    Past Surgical History:  Procedure Laterality Date   ORIF ULNAR FRACTURE Left 11/21/2021   Procedure: OPEN REDUCTION INTERNAL FIXATION (ORIF) left ULNAR shaft  FRACTURE;  Surgeon: Orene Desanctis, MD;  Location: [facility];  Service: Orthopedics;  Laterality: Left;  with MAC, needs 60 minutes   TIBIA IM NAIL INSERTION Right 12/31/2017   Procedure: INTRAMEDULLARY (IM) NAIL TIBIAL;  Surgeon: Dorna Leitz, MD;  Location: [facility];  Service: Orthopedics;  Laterality: Right;   TUBAL LIGATION Bilateral 11/13/2006   _0 ;   PPTL   Family History:  Family History  Problem Relation Age of Onset   Cancer Maternal Grandmother  breast   Family Psychiatric  History: None Social History:  Social History   Substance and Sexual Activity  Alcohol Use Not Currently   Comment: recovering alchohlic.     Social History   Substance and Sexual Activity  Drug Use Yes   Frequency: 7.0 times per week   Types: Marijuana, Cocaine   Comment: crack almost every day 0.5 grams. Weed: 1 blunt per day     Social History   Socioeconomic History   Marital status: Divorced    Spouse name: Not on file   Number of children: Not on file   Years of education: Not on file   Highest education level: Not on file  Occupational History   Not on file  Tobacco Use   Smoking status: Every Day    Packs/day: 1.00    Years: 35.00    Total pack years: 35.00    Types: Cigarettes   Smokeless tobacco: Never  Vaping Use   Vaping Use: Never used  Substance and Sexual Activity   Alcohol use: Not Currently    Comment: recovering alchohlic.   Drug use: Yes    Frequency: 7.0 times per week    Types: Marijuana, Cocaine    Comment: crack almost every day 0.5 grams. Weed: 1 blunt per day   Sexual activity: Yes    Partners: Male    Birth control/protection: Surgical  Other Topics Concern   Not on file  Social History Narrative   ** Merged History Encounter **       Social Determinants of Health   Financial Resource Strain: Low Risk  (03/11/2021)   Overall Financial Resource Strain (CARDIA)    Difficulty of Paying Living Expenses: Not hard at all  Food Insecurity: No Food Insecurity (03/11/2021)   Hunger Vital Sign    Worried About Running Out of Food in the Last Year: Never true    Ran Out of Food in the Last Year: Never true  Transportation Needs: No Transportation Needs (03/11/2021)   PRAPARE - Hydrologist (Medical): No    Lack of Transportation (Non-Medical): No  Physical Activity: Sufficiently Active (03/11/2021)   Exercise Vital Sign    Days of Exercise per Week: 7 days    Minutes of Exercise per Session: 30 min  Stress: Stress Concern Present (02/20/2022)   [place]    Feeling of Stress : Very much  Social Connections: Socially Isolated (03/11/2021)   Social Connection and Isolation Panel [NHANES]    Frequency of Communication with Friends and Family: Once a week    Frequency of Social  Gatherings with Friends and Family: Once a week    Attends Religious Services: Never    Marine scientist or Organizations: No    Attends Archivist Meetings: Never    Marital Status: Divorced   SDOH:  [place]: No Food Insecurity (03/11/2021)  Housing: Low Risk  (03/11/2021)  Transportation Needs: No Transportation Needs (03/11/2021)  Alcohol Screen: Low Risk  (04/18/2021)  Depression (PHQ2-9): High Risk (12/20/2022)  Financial Resource Strain: Low Risk  (03/11/2021)  Physical Activity: Sufficiently Active (03/11/2021)  Social Connections: Socially Isolated (03/11/2021)  Stress: Stress Concern Present (02/20/2022)  Tobacco Use: High Risk (12/19/2022)   Additional Social History:    Pain Medications: See MAR Prescriptions: See MAR Over the Counter: See  MAR History of alcohol / drug use?: Yes Longest period of sobriety (when/how  long): 6 years. Negative Consequences of Use: Financial Withdrawal Symptoms: Irritability Name of Substance 1: Crack Cocaine. 1 - Age of First Use: [age] 1 - Amount (size/oz): Pt reports, using $20 worth of Crack yesterday. 1 - Frequency: Pt reports, once per week. 1 - Duration: Ongoing. 1 - Last Use / Amount: Yesterday. 1 - Method of Aquiring: Purchase. 1- Route of Use: Smoke.                  Sleep: Good  Appetite:  Good  Current Medications:  Current Facility-Administered Medications  Medication Dose Route Frequency Provider Last Rate Last Admin   acetaminophen (TYLENOL) tablet 650 mg  650 mg Oral Q6H PRN Evette Georges, NP   650 mg at 12/19/22 1607   alum & mag hydroxide-simeth (MAALOX/MYLANTA) 200-200-20 MG/5ML suspension 30 mL  30 mL Oral Q4H PRN Evette Georges, NP       FLUoxetine (PROZAC) capsule 40 mg  40 mg Oral Daily Corky Sox, MD   40 mg at 12/20/22 9518   hydrOXYzine (ATARAX) tablet 25 mg  25 mg Oral TID PRN Onuoha, Chinwendu V, NP       magnesium hydroxide (MILK OF MAGNESIA) suspension 30  mL  30 mL Oral Daily PRN Evette Georges, NP       nicotine (NICODERM CQ - dosed in mg/24 hours) patch 21 mg  21 mg Transdermal Daily Evette Georges, NP   21 mg at 12/20/22 8416   QUEtiapine (SEROQUEL) tablet 300 mg  300 mg Oral QHS Corky Sox, MD   300 mg at 12/19/22 2119   Current Outpatient Medications  Medication Sig Dispense Refill   FLUoxetine (PROZAC) 40 MG capsule Take 1 capsule (40 mg total) by mouth daily. 90 capsule 0   gabapentin (NEURONTIN) 100 MG capsule Take 2 capsules (200 mg total) by mouth 3 (three) times daily. (Patient taking differently: Take 100 mg by mouth 4 (four) times daily.) 540 capsule 0   naltrexone (DEPADE) 50 MG tablet Take 50 mg by mouth daily.     QUEtiapine (SEROQUEL) 300 MG tablet TAKE 1 TABLET BY MOUTH EVERYDAY AT BEDTIME (Patient taking differently: Take 300 mg by mouth at bedtime.) 90 tablet 0    Labs  Lab Results:  Admission on 12/17/2022  Component Date Value Ref Range Status   SARS Coronavirus 2 by RT PCR 12/17/2022 NEGATIVE  NEGATIVE Final   Comment: (NOTE) SARS-CoV-2 target nucleic acids are NOT DETECTED.  The SARS-CoV-2 RNA is generally detectable in upper respiratory specimens during the acute phase of infection. The lowest concentration of SARS-CoV-2 viral copies this assay can detect is 138 copies/mL. A negative result does not preclude SARS-Cov-2 infection and should not be used as the sole basis for treatment or other patient management decisions. A negative result may occur with  improper specimen collection/handling, submission of specimen other than nasopharyngeal swab, presence of viral mutation(s) within the areas targeted by this assay, and inadequate number of viral copies(<138 copies/mL). A negative result must be combined with clinical observations, patient history, and epidemiological information. The expected result is Negative.  Fact Sheet for Patients:  EntrepreneurPulse.com.au  Fact Sheet for  Healthcare Providers:  IncredibleEmployment.be  This test is no                          t yet approved or cleared by the [place] FDA and  has been authorized for detection and/or diagnosis of SARS-CoV-2 by FDA under an Emergency  Use Authorization (EUA). This EUA will remain  in effect (meaning this test can be used) for the duration of the COVID-19 declaration under Section 564(b)(1) of the Act, 21 U.S.C.section 360bbb-3(b)(1), unless the authorization is terminated  or revoked sooner.       Influenza A by PCR 12/17/2022 NEGATIVE  NEGATIVE Final   Influenza B by PCR 12/17/2022 NEGATIVE  NEGATIVE Final   Comment: (NOTE) The Xpert Xpress SARS-CoV-2/FLU/RSV plus assay is intended as an aid in the diagnosis of influenza from Nasopharyngeal swab specimens and should not be used as a sole basis for treatment. Nasal washings and aspirates are unacceptable for Xpert Xpress SARS-CoV-2/FLU/RSV testing.  Fact Sheet for Patients: EntrepreneurPulse.com.au  Fact Sheet for Healthcare Providers: IncredibleEmployment.be  This test is not yet approved or cleared by the [place] FDA and has been authorized for detection and/or diagnosis of SARS-CoV-2 by FDA under an Emergency Use Authorization (EUA). This EUA will remain in effect (meaning this test can be used) for the duration of the COVID-19 declaration under Section 564(b)(1) of the Act, 21 U.S.C. section 360bbb-3(b)(1), unless the authorization is terminated or revoked.     Resp Syncytial Virus by PCR 12/17/2022 NEGATIVE  NEGATIVE Final   Comment: (NOTE) Fact Sheet for Patients: EntrepreneurPulse.com.au  Fact Sheet for Healthcare Providers: IncredibleEmployment.be  This test is not yet approved or cleared by the [place] FDA and has been authorized for detection and/or diagnosis of SARS-CoV-2 by FDA under an Emergency Use  Authorization (EUA). This EUA will remain in effect (meaning this test can be used) for the duration of the COVID-19 declaration under Section 564(b)(1) of the Act, 21 U.S.C. section 360bbb-3(b)(1), unless the authorization is terminated or revoked.  Performed at [facility], [place] [address] [place], [place] [address]    WBC 12/17/2022 11.2 (H)  4.0 - 10.5 K/uL Final   RBC 12/17/2022 4.44  3.87 - 5.11 MIL/uL Final   Hemoglobin 12/17/2022 13.7  12.0 - 15.0 g/dL Final   HCT 12/17/2022 40.4  36.0 - 46.0 % Final   MCV 12/17/2022 91.0  80.0 - 100.0 fL Final   MCH 12/17/2022 30.9  26.0 - 34.0 pg Final   MCHC 12/17/2022 33.9  30.0 - 36.0 g/dL Final   RDW 12/17/2022 13.2  11.5 - 15.5 % Final   Platelets 12/17/2022 397  150 - 400 K/uL Final   nRBC 12/17/2022 0.0  0.0 - 0.2 % Final   Neutrophils Relative % 12/17/2022 73  % Final   Neutro Abs 12/17/2022 8.3 (H)  1.7 - 7.7 K/uL Final   Lymphocytes Relative 12/17/2022 18  % Final   Lymphs Abs 12/17/2022 2.0  0.7 - 4.0 K/uL Final   Monocytes Relative 12/17/2022 7  % Final   Monocytes Absolute 12/17/2022 0.7  0.1 - 1.0 K/uL Final   Eosinophils Relative 12/17/2022 1  % Final   Eosinophils Absolute 12/17/2022 0.1  0.0 - 0.5 K/uL Final   Basophils Relative 12/17/2022 1  % Final   Basophils Absolute 12/17/2022 0.1  0.0 - 0.1 K/uL Final   Immature Granulocytes 12/17/2022 0  % Final   Abs Immature Granulocytes 12/17/2022 0.02  0.00 - 0.07 K/uL Final   Performed at [facility], [place] [address], [place], [place] [address]   Sodium 12/17/2022 140  135 - 145 mmol/L Final   Potassium 12/17/2022 3.7  3.5 - 5.1 mmol/L Final   Chloride 12/17/2022 104  98 - 111 mmol/L Final   CO2 12/17/2022  24  22 - 32 mmol/L Final   Glucose, Bld 12/17/2022 132 (H)  70 - 99 mg/dL Final   Glucose reference range applies only to samples taken after fasting for at least 8 hours.   BUN 12/17/2022 13  6 - 20 mg/dL Final   Creatinine, Ser 12/17/2022 0.84  0.44 - 1.00  mg/dL Final   Calcium 12/17/2022 9.9  8.9 - 10.3 mg/dL Final   Total Protein 12/17/2022 7.2  6.5 - 8.1 g/dL Final   Albumin 12/17/2022 4.1  3.5 - 5.0 g/dL Final   AST 12/17/2022 17  15 - 41 U/L Final   ALT 12/17/2022 18  0 - 44 U/L Final   Alkaline Phosphatase 12/17/2022 83  38 - 126 U/L Final   Total Bilirubin 12/17/2022 0.8  0.3 - 1.2 mg/dL Final   GFR, Estimated 12/17/2022 >60  >60 mL/min Final   Comment: (NOTE) Calculated using the CKD-EPI Creatinine Equation (2021)    Anion gap 12/17/2022 12  5 - 15 Final   Performed at [facility], [place] [address], [place], [place] [address]   Hgb A1c MFr Bld 12/17/2022 5.4  4.8 - 5.6 % Final   Comment: (NOTE) Pre diabetes:          5.7%-6.4%  Diabetes:              >6.4%  Glycemic control for   <7.0% adults with diabetes    Mean Plasma Glucose 12/17/2022 108.28  mg/dL Final   Performed at [facility], [place] [address] [place], [place] [address]   Cholesterol 12/17/2022 229 (H)  0 - 200 mg/dL Final   Triglycerides 12/17/2022 139  <150 mg/dL Final   HDL 12/17/2022 64  >40 mg/dL Final   Total CHOL/HDL Ratio 12/17/2022 3.6  RATIO Final   VLDL 12/17/2022 28  0 - 40 mg/dL Final   LDL Cholesterol 12/17/2022 137 (H)  0 - 99 mg/dL Final   Comment:        Total Cholesterol/HDL:CHD Risk Coronary Heart Disease Risk Table                     Men   Women  1/2 Average Risk   3.4   3.3  Average Risk       5.0   4.4  2 X Average Risk   9.6   7.1  3 X Average Risk  23.4   11.0        Use the calculated Patient Ratio above and the CHD Risk Table to determine the patient's CHD Risk.        ATP III CLASSIFICATION (LDL):  <100     mg/dL   Optimal  100-129  mg/dL   Near or Above                    Optimal  130-159  mg/dL   Borderline  160-189  mg/dL   High  >190     mg/dL   Very High Performed at [facility] [address] [place], [place] [address]    TSH 12/17/2022 2.295  0.350 - 4.500 uIU/mL Final   Comment: Performed by  a 3rd Generation assay with a functional sensitivity of <=0.01 uIU/mL. Performed at [facility], [place] [address], [place], [place] [address]    Preg Test, Ur 12/17/2022 Negative  Negative Preliminary   POC Amphetamine UR 12/17/2022 Positive (A)  NONE DETECTED (Cut Off Level  1000 ng/mL) Preliminary   POC Secobarbital (BAR) 12/17/2022 None Detected  NONE DETECTED (Cut Off Level 300 ng/mL) Preliminary   POC Buprenorphine (BUP) 12/17/2022 Positive (A)  NONE DETECTED (Cut Off Level 10 ng/mL) Preliminary   POC Oxazepam (BZO) 12/17/2022 Positive (A)  NONE DETECTED (Cut Off Level 300 ng/mL) Preliminary   POC Cocaine UR 12/17/2022 Positive (A)  NONE DETECTED (Cut Off Level 300 ng/mL) Preliminary   POC Methamphetamine UR 12/17/2022 Positive (A)  NONE DETECTED (Cut Off Level 1000 ng/mL) Preliminary   POC Morphine 12/17/2022 Positive (A)  NONE DETECTED (Cut Off Level 300 ng/mL) Preliminary   POC Methadone UR 12/17/2022 None Detected  NONE DETECTED (Cut Off Level 300 ng/mL) Preliminary   POC Oxycodone UR 12/17/2022 None Detected  NONE DETECTED (Cut Off Level 100 ng/mL) Preliminary   POC Marijuana UR 12/17/2022 Positive (A)  NONE DETECTED (Cut Off Level 50 ng/mL) Preliminary   SARSCOV2ONAVIRUS 2 AG 12/17/2022 NEGATIVE  NEGATIVE Final   Comment: (NOTE) SARS-CoV-2 antigen NOT DETECTED.   Negative results are presumptive.  Negative results do not preclude SARS-CoV-2 infection and should not be used as the sole basis for treatment or other patient management decisions, including infection  control decisions, particularly in the presence of clinical signs and  symptoms consistent with COVID-19, or in those who have been in contact with the virus.  Negative results must be combined with clinical observations, patient history, and epidemiological information. The expected result is Negative.  Fact Sheet for Patients: HandmadeRecipes.com.cy  Fact Sheet for Healthcare  Providers: FuneralLife.at  This test is not yet approved or cleared by the [place] FDA and  has been authorized for detection and/or diagnosis of SARS-CoV-2 by FDA under an Emergency Use Authorization (EUA).  This EUA will remain in effect (meaning this test can be used) for the duration of  the COV                          ID-19 declaration under Section 564(b)(1) of the Act, 21 U.S.C. section 360bbb-3(b)(1), unless the authorization is terminated or revoked sooner.     Preg Test, Ur 12/17/2022 NEGATIVE  NEGATIVE Final   Comment:        THE SENSITIVITY OF THIS METHODOLOGY IS >24 mIU/mL    SARS Coronavirus 2 by RT PCR 12/19/2022 NEGATIVE  NEGATIVE Final   Comment: (NOTE) SARS-CoV-2 target nucleic acids are NOT DETECTED.  The SARS-CoV-2 RNA is generally detectable in upper respiratory specimens during the acute phase of infection. The lowest concentration of SARS-CoV-2 viral copies this assay can detect is 138 copies/mL. A negative result does not preclude SARS-Cov-2 infection and should not be used as the sole basis for treatment or other patient management decisions. A negative result may occur with  improper specimen collection/handling, submission of specimen other than nasopharyngeal swab, presence of viral mutation(s) within the areas targeted by this assay, and inadequate number of viral copies(<138 copies/mL). A negative result must be combined with clinical observations, patient history, and epidemiological information. The expected result is Negative.  Fact Sheet for Patients:  EntrepreneurPulse.com.au  Fact Sheet for Healthcare Providers:  IncredibleEmployment.be  This test is no                          t yet approved or cleared by the [place] FDA and  has been authorized for detection and/or diagnosis of SARS-CoV-2 by FDA under an Emergency Use Authorization (  EUA). This EUA will remain   in effect (meaning this test can be used) for the duration of the COVID-19 declaration under Section 564(b)(1) of the Act, 21 U.S.C.section 360bbb-3(b)(1), unless the authorization is terminated  or revoked sooner.       Influenza A by PCR 12/19/2022 NEGATIVE  NEGATIVE Final   Influenza B by PCR 12/19/2022 NEGATIVE  NEGATIVE Final   Comment: (NOTE) The Xpert Xpress SARS-CoV-2/FLU/RSV plus assay is intended as an aid in the diagnosis of influenza from Nasopharyngeal swab specimens and should not be used as a sole basis for treatment. Nasal washings and aspirates are unacceptable for Xpert Xpress SARS-CoV-2/FLU/RSV testing.  Fact Sheet for Patients: EntrepreneurPulse.com.au  Fact Sheet for Healthcare Providers: IncredibleEmployment.be  This test is not yet approved or cleared by the [place] FDA and has been authorized for detection and/or diagnosis of SARS-CoV-2 by FDA under an Emergency Use Authorization (EUA). This EUA will remain in effect (meaning this test can be used) for the duration of the COVID-19 declaration under Section 564(b)(1) of the Act, 21 U.S.C. section 360bbb-3(b)(1), unless the authorization is terminated or revoked.     Resp Syncytial Virus by PCR 12/19/2022 NEGATIVE  NEGATIVE Final   Comment: (NOTE) Fact Sheet for Patients: EntrepreneurPulse.com.au  Fact Sheet for Healthcare Providers: IncredibleEmployment.be  This test is not yet approved or cleared by the [place] FDA and has been authorized for detection and/or diagnosis of SARS-CoV-2 by FDA under an Emergency Use Authorization (EUA). This EUA will remain in effect (meaning this test can be used) for the duration of the COVID-19 declaration under Section 564(b)(1) of the Act, 21 U.S.C. section 360bbb-3(b)(1), unless the authorization is terminated or revoked.  Performed at [facility], [place] [address],  [place], [place] [address]   Admission on 11/08/2022, Discharged on 11/08/2022  Component Date Value Ref Range Status   WBC 11/08/2022 15.0 (H)  4.0 - 10.5 K/uL Final   RBC 11/08/2022 4.25  3.87 - 5.11 MIL/uL Final   Hemoglobin 11/08/2022 13.2  12.0 - 15.0 g/dL Final   HCT 11/08/2022 40.2  36.0 - 46.0 % Final   MCV 11/08/2022 94.6  80.0 - 100.0 fL Final   MCH 11/08/2022 31.1  26.0 - 34.0 pg Final   MCHC 11/08/2022 32.8  30.0 - 36.0 g/dL Final   RDW 11/08/2022 13.0  11.5 - 15.5 % Final   Platelets 11/08/2022 438 (H)  150 - 400 K/uL Final   nRBC 11/08/2022 0.0  0.0 - 0.2 % Final   Neutrophils Relative % 11/08/2022 81  % Final   Neutro Abs 11/08/2022 12.4 (H)  1.7 - 7.7 K/uL Final   Lymphocytes Relative 11/08/2022 10  % Final   Lymphs Abs 11/08/2022 1.4  0.7 - 4.0 K/uL Final   Monocytes Relative 11/08/2022 6  % Final   Monocytes Absolute 11/08/2022 0.8  0.1 - 1.0 K/uL Final   Eosinophils Relative 11/08/2022 1  % Final   Eosinophils Absolute 11/08/2022 0.1  0.0 - 0.5 K/uL Final   Basophils Relative 11/08/2022 1  % Final   Basophils Absolute 11/08/2022 0.1  0.0 - 0.1 K/uL Final   Immature Granulocytes 11/08/2022 1  % Final   Abs Immature Granulocytes 11/08/2022 0.09 (H)  0.00 - 0.07 K/uL Final   Performed at [facility], [place] [address], [place], [place] [address]   Sodium 11/08/2022 140  135 - 145 mmol/L Final   Potassium 11/08/2022 4.3  3.5 - 5.1 mmol/L Final  Chloride 11/08/2022 106  98 - 111 mmol/L Final   CO2 11/08/2022 24  22 - 32 mmol/L Final   Glucose, Bld 11/08/2022 132 (H)  70 - 99 mg/dL Final   Glucose reference range applies only to samples taken after fasting for at least 8 hours.   BUN 11/08/2022 16  6 - 20 mg/dL Final   Creatinine, Ser 11/08/2022 0.95  0.44 - 1.00 mg/dL Final   Calcium 11/08/2022 9.4  8.9 - 10.3 mg/dL Final   GFR, Estimated 11/08/2022 >60  >60 mL/min Final   Comment: (NOTE) Calculated using the CKD-EPI Creatinine Equation (2021)     Anion gap 11/08/2022 10  5 - 15 Final   Performed at [facility], [place] [address] [place], [place] [address]   Magnesium 11/08/2022 2.2  1.7 - 2.4 mg/dL Final   Performed at [place] [address], [place], [place] [address]   Opiates 11/08/2022 NONE DETECTED  NONE DETECTED Final   Cocaine 11/08/2022 POSITIVE (A)  NONE DETECTED Final   Benzodiazepines 11/08/2022 NONE DETECTED  NONE DETECTED Final   Amphetamines 11/08/2022 NONE DETECTED  NONE DETECTED Final   Tetrahydrocannabinol 11/08/2022 NONE DETECTED  NONE DETECTED Final   Barbiturates 11/08/2022 NONE DETECTED  NONE DETECTED Final   Comment: (NOTE) DRUG SCREEN FOR MEDICAL PURPOSES ONLY.  IF CONFIRMATION IS NEEDED FOR ANY PURPOSE, NOTIFY LAB WITHIN 5 DAYS.  LOWEST DETECTABLE LIMITS FOR URINE DRUG SCREEN Drug Class                     Cutoff (ng/mL) Amphetamine and metabolites    1000 Barbiturate and metabolites    200 Benzodiazepine                 200 Opiates and metabolites        300 Cocaine and metabolites        300 THC                            50 Performed at [facility], [place] [address] [place], [place] [address]     Blood Alcohol level:  Lab Results  Component Value Date   [facility] <10 04/18/2021   ETH <5 11/23/8249    Metabolic Disorder Labs: Lab Results  Component Value Date   HGBA1C 5.4 12/17/2022   MPG 108.28 12/17/2022   MPG 116.89 04/19/2021   No results found for: "PROLACTIN" Lab Results  Component Value Date   CHOL 229 (H) 12/17/2022   TRIG 139 12/17/2022   HDL 64 12/17/2022   CHOLHDL 3.6 12/17/2022   VLDL 28 12/17/2022   LDLCALC 137 (H) 12/17/2022   LDLCALC 92 04/19/2021    Therapeutic Lab Levels: No results found for: "LITHIUM" No results found for: "VALPROATE" No results found for: "CBMZ"  Physical Findings   AIMS    Flowsheet Row Admission (Discharged) from 04/19/2021 in [place] [facility]  Admission (Discharged) from 10/31/2015 in [place] [facility]  AIMS Total Score 0 0      AUDIT    Flowsheet Row Admission (Discharged) from 04/19/2021 in [place] [facility] Admission (Discharged) from 10/31/2015 in [place] [facility]  Alcohol Use Disorder Identification Test Final Score (AUDIT) 0 0      PHQ2-9    Flowsheet Row ED from 12/17/2022 in [facility] Counselor from 02/20/2022 in  [facility] Video Visit from 09/09/2021 in [facility] Counselor from 03/11/2021 in [place]  PHQ-2 Total Score _0 PHQ-9 Total Score _1 [place] ED from 12/17/2022 in [facility] ED from 11/08/2022 in [facility] Emergency Department at [facility] ED from 10/30/2022 in [facility] Emergency Department at [place] No Risk No Risk No Risk        Musculoskeletal  Strength & Muscle Tone: within normal limits Gait & Station: normal Patient leans: N/A  Psychiatric Specialty Exam  Presentation  General Appearance:  Appropriate for Environment  Eye Contact: Fair  Speech: Clear and Coherent  Speech Volume: Normal  Handedness: Right   Mood and Affect  Mood: Euthymic  Affect: Appropriate   Thought Process  Thought Processes: Coherent  Descriptions of Associations:Intact  Orientation:Full (Time, Place and Person)  Thought Content:Logical  Diagnosis of Schizophrenia or Schizoaffective disorder in past: No    Hallucinations:Hallucinations: None  Ideas of Reference:None  Suicidal Thoughts:Suicidal Thoughts: No  Homicidal Thoughts:Homicidal Thoughts: No   Sensorium  Memory: Immediate Good; Recent Good  Judgment: Good  Insight: Good   Executive Functions   Concentration: Good  Attention Span: Good  Recall: Good  Fund of Knowledge: Fair  Language: Good   Psychomotor Activity  Psychomotor Activity: Psychomotor Activity: Decreased   Assets  Assets: Desire for Improvement; Resilience; Social Support   Sleep  Sleep: Sleep: Good   No data recorded  Physical Exam  Physical Exam HENT:     Head: Normocephalic and atraumatic.  Pulmonary:     Effort: Pulmonary effort is normal.  Neurological:     Mental Status: She is alert and oriented to person, place, and time.   Review of Systems  Psychiatric/Behavioral:  Negative for hallucinations and suicidal ideas. The patient does not have insomnia.    Blood pressure 132/72, pulse 86, temperature 98 F (36.7 C), resp. rate 16, SpO2 100 %. There is no height or weight on file to calculate BMI.  Treatment Plan Summary: Daily contact with patient to assess and evaluate symptoms and progress in treatment and Medication management   Based on assessment patient appears to be stabilizing. Patient was endorsing some physical symptoms yesterday that may have been 2/2 to withdrawal but appears to be improved today. Patient continues to deny awareness of where a lot of the substances in her urine came from. Patient does appear to minimize and shift the focus as she will also admit that her mood has not been stable and that she attempted to harm herself, and endorses willingness to get back into therapy and continue medicine at discharge. Patient recognizes that she "blacked out" but reports that she does not think she would have done any form or pills or other substances, and floats the idea that her ex or her bestfriend might be tampering with things, or she has some health anomaly as she provide anecdotes about other strange health events the last few months.   Stimulant use disorder, severe ( methamphetamine and cocaine) Opioid use- Buprenorphine Cannabis use  - No subjective or  objective withdrawal, reported history of severe withdrawal - CIWA for 3 days  Tobacco use disorder, severe - Continue Nicotine patch 21 mg daily   Bipolar affective disorder - Continue home Seroquel 364m QHS and Prozac 430mdaily, given patient's desire to get back  on her previous medications - Do not prescribe home gabapentin given abuse potential with opioids   Medical: - Slight leukocytosis, likely stress reaction given no fever or other symptoms of infection. - Resp panel (-), 12/20/22  May attempt to [place] [facility] if patient still stable, will provide CDIOP number and send message to CDIOP to get patient admitted to the program.     PGY-3 Freida Busman, MD 12/20/2022 12:04 PM

## 2022-12-20 NOTE — ED Notes (Signed)
Pt is attending AA. 

## 2022-12-20 NOTE — ED Notes (Signed)
Patient asleep in bed at this time.  No complaints or concern.  Will monitor.

## 2022-12-20 NOTE — Progress Notes (Signed)
Patient is presently awake and alert sitting in dayroom eating lunch.  Overall outlook has improved and affect appears brighter.  Denies avh shi or plan.  Future oriented and with positive outlook.  Encouraged patient to make needs known to staff. Will monitor and provide safe environment.

## 2022-12-21 DIAGNOSIS — F1510 Other stimulant abuse, uncomplicated: Secondary | ICD-10-CM | POA: Diagnosis not present

## 2022-12-21 DIAGNOSIS — F1210 Cannabis abuse, uncomplicated: Secondary | ICD-10-CM | POA: Diagnosis not present

## 2022-12-21 DIAGNOSIS — F3181 Bipolar II disorder: Secondary | ICD-10-CM | POA: Diagnosis not present

## 2022-12-21 DIAGNOSIS — F1410 Cocaine abuse, uncomplicated: Secondary | ICD-10-CM | POA: Diagnosis not present

## 2022-12-21 MED ORDER — QUETIAPINE FUMARATE 300 MG PO TABS: 300.0000 mg | ORAL_TABLET | Freq: Every day | ORAL | 0 refills | Status: AC

## 2022-12-21 MED ORDER — FLUOXETINE HCL 40 MG PO CAPS: 40.0000 mg | ORAL_CAPSULE | Freq: Every day | ORAL | 0 refills | Status: AC

## 2022-12-21 MED ORDER — NICOTINE 21 MG/24HR TD PT24: 21.0000 mg | MEDICATED_PATCH | Freq: Every day | TRANSDERMAL | 0 refills | Status: AC

## 2022-12-21 NOTE — ED Notes (Signed)
Pt asleep in bed. Respirations even and unlabored. Monitoring for safety. 

## 2022-12-21 NOTE — ED Notes (Signed)
Patient awake and alert sitting in dayroom eating breakfast.  She is calm, cooperative and pleasant on approach.  No complaints at this tome.  No evidence of withdrawal.  Patient makes needs known to staff.  Will monitor and provide safe supportive environment.
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
## 2022-12-22 ENCOUNTER — Telehealth (HOSPITAL_COMMUNITY): Payer: Self-pay | Admitting: Licensed Clinical Social Worker

## 2022-12-22 ENCOUNTER — Ambulatory Visit (INDEPENDENT_AMBULATORY_CARE_PROVIDER_SITE_OTHER): Payer: Medicaid Other | Admitting: Licensed Clinical Social Worker

## 2022-12-22 ENCOUNTER — Encounter (HOSPITAL_COMMUNITY): Payer: Self-pay | Admitting: Licensed Clinical Social Worker

## 2022-12-22 DIAGNOSIS — F1994 Other psychoactive substance use, unspecified with psychoactive substance-induced mood disorder: Secondary | ICD-10-CM

## 2022-12-22 DIAGNOSIS — F3132 Bipolar disorder, current episode depressed, moderate: Secondary | ICD-10-CM

## 2022-12-22 NOTE — Telephone Encounter (Signed)
The therapist attempts to reach [facility] in relation to a referral that he received from Mr. Alvera Singh, MSW, LCSW. The therapist leaves a HIPAA-compliant voicemail with his direct callback number which is [phone].  Adam Phenix, [place], LCSW, [facility] [place] 12/22/2022

## 2022-12-22 NOTE — Progress Notes (Signed)
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 Program); Partial Hospitalization; Facility B    Collaboration of Care: Other Referral to CDIOP   Patient/Guardian was advised Release of Information must be obtained prior to any record release in order to collaborate their care with an outside provider. Patient/Guardian was advised if they have not already done so to contact the registration department to sign all necessary forms in order for [place] to release information regarding their care.   Consent: Patient/Guardian gives verbal consent for treatment and assignment of benefits for services provided during this visit. Patient/Guardian expressed understanding and agreed to proceed.   Weber Cooks, LCSW

## 2022-12-23 ENCOUNTER — Telehealth (HOSPITAL_COMMUNITY): Payer: Self-pay | Admitting: Licensed Clinical Social Worker

## 2022-12-23 NOTE — Telephone Encounter (Signed)
The therapist attempts to return [place]'s call leaving a HIPAA-compliant message with a woman who answers at her contact number saying that Angelisse is currently at an appointment.  Adam Phenix, [place], LCSW, [facility], [place] 12/23/2022

## 2022-12-26 ENCOUNTER — Telehealth (HOSPITAL_COMMUNITY): Payer: Self-pay | Admitting: Licensed Clinical Social Worker

## 2022-12-26 NOTE — Telephone Encounter (Signed)
The therapist attempts to return Michele Webster's call leaving another HIPAA-compliant message with the woman who answers the phone who is most likely her mother.  Adam Phenix, [place], LCSW, [facility], [place] 12/26/2022

## 2022-12-26 NOTE — Telephone Encounter (Signed)
The therapist receives a all from [place] confirming her identity via two identifiers and providing her a brief overview of the CD IOP.   She is scheduled to come in for an assesssment with this therapist on 12/29/22 at 1 p.m.  Adam Phenix, [place], LCSW, [facility], [place] 12/26/2022

## 2022-12-29 ENCOUNTER — Ambulatory Visit (HOSPITAL_COMMUNITY): Payer: Medicaid Other | Admitting: Licensed Clinical Social Worker

## 2022-12-29 DIAGNOSIS — F17200 Nicotine dependence, unspecified, uncomplicated: Secondary | ICD-10-CM

## 2022-12-29 DIAGNOSIS — F1021 Alcohol dependence, in remission: Secondary | ICD-10-CM

## 2022-12-29 DIAGNOSIS — F1420 Cocaine dependence, uncomplicated: Secondary | ICD-10-CM

## 2022-12-29 DIAGNOSIS — F3181 Bipolar II disorder: Secondary | ICD-10-CM

## 2022-12-29 DIAGNOSIS — F1520 Other stimulant dependence, uncomplicated: Secondary | ICD-10-CM

## 2022-12-29 DIAGNOSIS — F1210 Cannabis abuse, uncomplicated: Secondary | ICD-10-CM

## 2022-12-29 NOTE — Progress Notes (Addendum)
Ileah presents today for her assessment having previously seen this therapist for therapy years ago at [facility]. She was admitted to the [facility] as she was going to slice her wrists but her son intervened. She was recently taken off her Neurontin which she was taking for anxiety as for the  past month, she felt light-headed and dizzy and claims to have had periods of blackout. She says that she last used meth the day she was admitted to [facility] and used crack yesterday smoke a dime's worth.    She says that her only sobriety from cocaine was between 2021 and 2022. 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 She was fired after working their for four years. She has not found a job since noting that people are not handing out jobs for convicted felons with [place] having a record for felony larceny.   She reports being in good  health except for some nodules they spotted on her lungs requiring her to have a CAT scan in June of this year. 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   Lisbet's PHQ-9 was a 14 and GAD-7 was a 7 on 12/22/22.  Presently, she lives with her mother and son, age 49. When the therapist notes her transportation issues, she says that if she cannot borrow her mom's car that she can take the bus; however, the therapist makes her aware of Medicaid transportation to and and from medical appointments encouraging her to look into this. Keegan notes that she was not using drugs between the ages of  8 to 3 but drank "a little bit."   She says that she first used cocaine at age 87, first used meth at age 24 noting that she used twice in the past 6 months. She first got drunk at age 37 or 57 and has not drunk alcohol since going to detox in 2014. She first smoked pot at age 69 and last smoked pot two weeks ago. She started smoking at age 89 and is down to 7 cigarettes per day from pack-and-a-half. She has  patches at home but does not always wear them admitting that she will take them off at times as she prefers to smoke.   Her past treatment for addiction includes outpatient at [facility], [facility] inpatient twice, and [facility] in [place] for inpatient treatment. She has not been to NA in a couple years was working on  getting a Publishing copy and was attending daily until she was sent away to prison and has not been back since. She admits that part of he problem she has experienced in the program is that she does not believe in God.  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  As for family history of addition, her father had substance use issues and her maternal aunt and uncle, who are both sober now, also had issues. When asked the reason that she wants to stop now, she says that she does not want to be "40 chasing dope" and  wants to do better for her children saying that her 10 year-old does not talk to her. She says that she uses as she does not  want to feel her feelings and go through it having a history of past trauma.  The therapist explains that based on her recent use and past history that an inpatient residential treatment facility would be the recommended level of care. She says that the [facility] mentioned a 2 year program in [place]; however, [place] says that her mom is sick and she leave her but knows that she cannot care for her if she is using.   She wants to start the IOP on 12/31/22 with the understanding that if she cannot get to a point of abstaining with the IOP, NA, and a Sponsor that she will be referred to a higher level of care such as a 28 day program.   She says that her UDS today should be positive for cocaine and THC; however, her UDS is positive for cocaine, THC, and Meth. She says that it is possible that the crack she is smoking has Meth added to it but is not sure.   Adam Phenix, [place], LCSW, [facility], [place] 12/29/2022

## 2022-12-31 ENCOUNTER — Ambulatory Visit (INDEPENDENT_AMBULATORY_CARE_PROVIDER_SITE_OTHER): Payer: Medicaid Other | Admitting: Licensed Clinical Social Worker

## 2022-12-31 ENCOUNTER — Encounter (HOSPITAL_COMMUNITY): Payer: Self-pay | Admitting: Medical

## 2022-12-31 DIAGNOSIS — F1420 Cocaine dependence, uncomplicated: Secondary | ICD-10-CM | POA: Diagnosis not present

## 2022-12-31 DIAGNOSIS — F5101 Primary insomnia: Secondary | ICD-10-CM

## 2022-12-31 DIAGNOSIS — F1520 Other stimulant dependence, uncomplicated: Secondary | ICD-10-CM | POA: Diagnosis not present

## 2022-12-31 DIAGNOSIS — F17200 Nicotine dependence, unspecified, uncomplicated: Secondary | ICD-10-CM

## 2022-12-31 DIAGNOSIS — F411 Generalized anxiety disorder: Secondary | ICD-10-CM

## 2022-12-31 DIAGNOSIS — F1021 Alcohol dependence, in remission: Secondary | ICD-10-CM

## 2022-12-31 DIAGNOSIS — F1994 Other psychoactive substance use, unspecified with psychoactive substance-induced mood disorder: Secondary | ICD-10-CM

## 2022-12-31 DIAGNOSIS — F3132 Bipolar disorder, current episode depressed, moderate: Secondary | ICD-10-CM

## 2022-12-31 DIAGNOSIS — F1210 Cannabis abuse, uncomplicated: Secondary | ICD-10-CM

## 2022-12-31 NOTE — Progress Notes (Signed)
Daily Group Progress Note   Program: CD IOP     Individual Time: 9 a.m. to 12 p.m.    Type of Therapy: Process and Psychoeducational    Topic: The therapist checks in with group members, assesses for SI/HI/psychosis and overall level of functioning. The therapist inquires about sobriety date and number of community support meetings attended since last session.    The therapist introduces a new member to group and facilitates a discussion on self-talk and how to counter unhelpful self-talk as well as the role of encouraging self-talk and self-compassion in recovery.   The therapist suggests that understanding the reason that a person treats others badly is fine provided that it does not become an excuse for tolerating inexcusable behavior and not setting limits.    The therapist observes that a person in early recovery is not in a position to be caring for others with chronic health conditions as recovery is a full-time job. He educates group members on the systems in place to take care of disabled adults in need of protective services who lack the capacity to consent to these services.    Summary: Michele Webster presents rating her depression as a "5" and anxiety as an "9."    She says that her mom has fallen and that her son keeps messaging her. She used yesterday so her sobriety date is today. Michele Webster says that her mood is "scared" and "alone." She says that she has been "f__ed up for 30 years" but has not drunk alcohol since 2014. She says that she is not sure how she is going to feel without using substances as she is going to have to feel her feelings. Michele Webster's mother sounds to possibly have dementia and does not allow Michele Webster to attend her doctors' appointments and refuses home health, etcetera.   The therapist informs Michele Webster that she need not be alone anymore if she gets involved in NA and follows the program. He educates Michele Webster on ways to get services for her mother disclosing his belief  that Michele Webster will not be successful in recovery if she is focusing on taking care of her mother as recovery must come first. He validates that her emotions are going to be very big at first; however, will become more manageable the further she is in her recovery.    Progress Towards Goals: Michele Webster reports cocaine use as of yesterday.    UDS collected: No Results: No   AA/NA attended?: No   Sponsor?: No   Adam Phenix, [place], LCSW, [facility], [facility] 12/31/2022

## 2022-12-31 NOTE — Progress Notes (Signed)
Patient ID: Michele Webster, female   DOB: 1976/09/23, 47 y.o.   MRN: [id number]

## 2022-12-31 NOTE — Progress Notes (Signed)
Patient ID: Michele Webster, female   DOB: [birth year], 47 y.o.   MRN: [id number]  Pt in [place] per Counselor:  The therapist explains that based on her recent use and past history that an inpatient residential treatment facility would be the recommended level of care. She says that the [facility] mentioned a 2 year program in [place]; however, [name] says that her mom is sick and she leave her but knows that she cannot care for her if she is using.    She wants to start the IOP on 12/31/22 with the understanding that if she cannot get to a point of abstaining with the IOP, NA, and a Sponsor that she will be referred to a higher level of care such as a 28 day program.    She says that her UDS today should be positive for cocaine and THC; however, her UDS is positive for cocaine, THC, and Meth. She says that it is possible that the crack she is smoking has Meth added to it but is not sure.    Adam Phenix, MA, LCSW, [facility], [place] 12/29/2022  Will hold off on further assessment pending her ability to meet lower criteria for CDIOP Pt has medications from Dr Candie Chroman

## 2023-01-02 ENCOUNTER — Ambulatory Visit (HOSPITAL_COMMUNITY): Payer: Medicaid Other

## 2023-01-02 ENCOUNTER — Encounter (HOSPITAL_COMMUNITY): Payer: Self-pay

## 2023-01-05 ENCOUNTER — Encounter (HOSPITAL_COMMUNITY): Payer: Self-pay

## 2023-01-05 ENCOUNTER — Telehealth (HOSPITAL_COMMUNITY): Payer: Self-pay | Admitting: Licensed Clinical Social Worker

## 2023-01-05 ENCOUNTER — Ambulatory Visit (HOSPITAL_COMMUNITY): Payer: Medicaid Other | Admitting: Licensed Clinical Social Worker

## 2023-01-05 NOTE — Telephone Encounter (Signed)
The therapist attempts to reach [facility] as she has not returned to group and is informed by the woman who answers the phone that Taliana "passed" in the "wee hours" of Thursday morning which would be 01-20-23.  Adam Phenix, [place], LCSW, [facility], [place] 01/05/2023

## 2023-01-07 ENCOUNTER — Ambulatory Visit (HOSPITAL_COMMUNITY): Payer: Medicaid Other

## 2023-01-09 ENCOUNTER — Ambulatory Visit (HOSPITAL_COMMUNITY): Payer: Medicaid Other

## 2023-01-12 ENCOUNTER — Ambulatory Visit (HOSPITAL_COMMUNITY): Payer: Medicaid Other

## 2023-01-14 ENCOUNTER — Ambulatory Visit (HOSPITAL_COMMUNITY): Payer: Medicaid Other

## 2023-01-16 ENCOUNTER — Ambulatory Visit (HOSPITAL_COMMUNITY): Payer: Medicaid Other

## 2023-01-19 ENCOUNTER — Ambulatory Visit (HOSPITAL_COMMUNITY): Payer: Medicaid Other

## 2023-01-21 ENCOUNTER — Ambulatory Visit (HOSPITAL_COMMUNITY): Payer: Medicaid Other

## 2023-01-23 ENCOUNTER — Ambulatory Visit (HOSPITAL_COMMUNITY): Payer: Medicaid Other

## 2023-01-26 ENCOUNTER — Ambulatory Visit (HOSPITAL_COMMUNITY): Payer: Medicaid Other

## 2023-01-28 ENCOUNTER — Ambulatory Visit (HOSPITAL_COMMUNITY): Payer: Medicaid Other

## 2023-01-30 ENCOUNTER — Ambulatory Visit (HOSPITAL_COMMUNITY): Payer: Medicaid Other

## 2023-01-30 DEATH — deceased

## 2023-02-05 ENCOUNTER — Ambulatory Visit (HOSPITAL_COMMUNITY): Payer: Medicaid Other | Admitting: Student
